# Patient Record
Sex: Male | Born: 1945 | Race: White | Hispanic: No | Marital: Married | Smoking: Former smoker
Health system: Southern US, Community
[De-identification: ages and names within clinical notes are randomized; demographics above are authoritative.]

## PROBLEM LIST (undated history)

## (undated) DIAGNOSIS — M1990 Unspecified osteoarthritis, unspecified site: Secondary | ICD-10-CM

## (undated) DIAGNOSIS — IMO0001 Reserved for inherently not codable concepts without codable children: Secondary | ICD-10-CM

## (undated) DIAGNOSIS — I4510 Unspecified right bundle-branch block: Secondary | ICD-10-CM

## (undated) DIAGNOSIS — R351 Nocturia: Secondary | ICD-10-CM

## (undated) DIAGNOSIS — I872 Venous insufficiency (chronic) (peripheral): Secondary | ICD-10-CM

## (undated) DIAGNOSIS — D494 Neoplasm of unspecified behavior of bladder: Secondary | ICD-10-CM

## (undated) DIAGNOSIS — Q642 Congenital posterior urethral valves: Secondary | ICD-10-CM

## (undated) DIAGNOSIS — D6851 Activated protein C resistance: Secondary | ICD-10-CM

## (undated) DIAGNOSIS — K219 Gastro-esophageal reflux disease without esophagitis: Secondary | ICD-10-CM

## (undated) DIAGNOSIS — R600 Localized edema: Secondary | ICD-10-CM

## (undated) DIAGNOSIS — M5136 Other intervertebral disc degeneration, lumbar region: Secondary | ICD-10-CM

## (undated) DIAGNOSIS — Z7901 Long term (current) use of anticoagulants: Secondary | ICD-10-CM

## (undated) DIAGNOSIS — Z8709 Personal history of other diseases of the respiratory system: Secondary | ICD-10-CM

## (undated) DIAGNOSIS — D682 Hereditary deficiency of other clotting factors: Secondary | ICD-10-CM

## (undated) DIAGNOSIS — Z8249 Family history of ischemic heart disease and other diseases of the circulatory system: Secondary | ICD-10-CM

## (undated) DIAGNOSIS — Z832 Family history of diseases of the blood and blood-forming organs and certain disorders involving the immune mechanism: Secondary | ICD-10-CM

## (undated) DIAGNOSIS — Z8679 Personal history of other diseases of the circulatory system: Secondary | ICD-10-CM

## (undated) DIAGNOSIS — R319 Hematuria, unspecified: Secondary | ICD-10-CM

## (undated) DIAGNOSIS — J45909 Unspecified asthma, uncomplicated: Secondary | ICD-10-CM

## (undated) DIAGNOSIS — M51369 Other intervertebral disc degeneration, lumbar region without mention of lumbar back pain or lower extremity pain: Secondary | ICD-10-CM

## (undated) DIAGNOSIS — I10 Essential (primary) hypertension: Secondary | ICD-10-CM

## (undated) DIAGNOSIS — I83811 Varicose veins of right lower extremities with pain: Secondary | ICD-10-CM

## (undated) DIAGNOSIS — Z973 Presence of spectacles and contact lenses: Secondary | ICD-10-CM

## (undated) DIAGNOSIS — N400 Enlarged prostate without lower urinary tract symptoms: Secondary | ICD-10-CM

## (undated) DIAGNOSIS — C801 Malignant (primary) neoplasm, unspecified: Secondary | ICD-10-CM

## (undated) DIAGNOSIS — J309 Allergic rhinitis, unspecified: Secondary | ICD-10-CM

## (undated) DIAGNOSIS — N401 Enlarged prostate with lower urinary tract symptoms: Secondary | ICD-10-CM

## (undated) DIAGNOSIS — Z8719 Personal history of other diseases of the digestive system: Secondary | ICD-10-CM

## (undated) DIAGNOSIS — M5030 Other cervical disc degeneration, unspecified cervical region: Secondary | ICD-10-CM

## (undated) DIAGNOSIS — I480 Paroxysmal atrial fibrillation: Secondary | ICD-10-CM

## (undated) DIAGNOSIS — R350 Frequency of micturition: Secondary | ICD-10-CM

## (undated) HISTORY — PX: CATARACT EXTRACTION W/ INTRAOCULAR LENS  IMPLANT, BILATERAL: SHX1307

## (undated) HISTORY — PX: OTHER SURGICAL HISTORY: SHX169

## (undated) HISTORY — PX: NO PAST SURGERIES: SHX2092

---

## 1983-01-14 DIAGNOSIS — M545 Low back pain, unspecified: Secondary | ICD-10-CM | POA: Insufficient documentation

## 1993-01-13 DIAGNOSIS — J45909 Unspecified asthma, uncomplicated: Secondary | ICD-10-CM | POA: Insufficient documentation

## 2003-01-14 DIAGNOSIS — M542 Cervicalgia: Secondary | ICD-10-CM | POA: Insufficient documentation

## 2003-05-25 ENCOUNTER — Ambulatory Visit (HOSPITAL_COMMUNITY): Admission: RE | Admit: 2003-05-25 | Discharge: 2003-05-25 | Payer: Self-pay | Admitting: Gastroenterology

## 2003-11-29 ENCOUNTER — Ambulatory Visit (HOSPITAL_COMMUNITY): Admission: RE | Admit: 2003-11-29 | Discharge: 2003-11-29 | Payer: Self-pay | Admitting: Gastroenterology

## 2005-03-14 ENCOUNTER — Encounter: Admission: RE | Admit: 2005-03-14 | Discharge: 2005-03-14 | Payer: Self-pay | Admitting: Internal Medicine

## 2005-08-22 ENCOUNTER — Encounter: Admission: RE | Admit: 2005-08-22 | Discharge: 2005-08-22 | Payer: Self-pay | Admitting: Internal Medicine

## 2006-06-19 ENCOUNTER — Encounter: Admission: RE | Admit: 2006-06-19 | Discharge: 2006-06-19 | Payer: Self-pay | Admitting: Internal Medicine

## 2008-04-17 ENCOUNTER — Encounter: Admission: RE | Admit: 2008-04-17 | Discharge: 2008-04-17 | Payer: Self-pay | Admitting: Neurological Surgery

## 2010-05-31 NOTE — Op Note (Signed)
NAME:  Christian [name], Christian [name]                           ACCOUNT NO.:  [id number]   MEDICAL RECORD NO.:  [id number]                   PATIENT TYPE:  AMB   LOCATION:  ENDO                                 FACILITY:  [facility]   PHYSICIAN:  Danise Edge, M.D.                DATE OF BIRTH:  28-Jun-1945   DATE OF PROCEDURE:  05/25/2003  DATE OF DISCHARGE:                                 OPERATIVE REPORT   PROCEDURE:  Surveillance colonoscopy.   PROCEDURE INDICATION:  Mr. Christian [name] is a 65 year old male born December 17, 1942.  Approximately 3 years ago, Mr. Christian [name] underwent a screening  colonoscopy in [facility]; colon polyps were removed.  Mr. Christian [name] was told  to have a repeat colonoscopy in 3 years.  Today he is due for a surveillance  colonoscopy with polypectomy to prevent colon cancer.   ENDOSCOPIST:  Danise Edge, M.D.   PREMEDICATION:  Versed 10 mg, Demerol 75 mg.   DESCRIPTION OF PROCEDURE:  After obtaining informed consent, Mr. Christian [name] was  placed in the left lateral decubitus position.  I administered intravenous  Demerol and intravenous Versed to achieve conscious sedation for the  procedure.  The patient's blood pressure, oxygen saturation, and cardiac  rhythm were monitored throughout the procedure and documented in the medical  record.   Anal inspection and digital rectal exam were normal.  The prostate was non  nodular.  The Olympus adjustable pediatric video colonoscope was introduced  into the rectum and advanced to the cecum.  Colonic preparation for the exam  today was excellent.   Rectum:  Normal.   Sigmoid colon and descending colon:  Normal.   Splenic flexure:  Normal.   Transverse colon:  Normal.   Hepatic flexure:  Normal.   Ascending colon:  Normal.   Cecum and ileocecal valve:  Normal.   ASSESSMENT:  Normal screening proctocolonoscopy to the cecum.   RECOMMENDATION:  Repeat colonoscopy in 5 years.                                               Danise Edge, M.D.    MJ/MEDQ  D:  05/25/2003  T:  05/25/2003  Job:  [id number]

## 2010-05-31 NOTE — Op Note (Signed)
NAMELARNIE, HEART                 ACCOUNT NO.:  [id number]   MEDICAL RECORD NO.:  [id number]          PATIENT TYPE:  AMB   LOCATION:  ENDO                         FACILITY:  [facility]   PHYSICIAN:  Danise Edge, M.D.   DATE OF BIRTH:  [birth year]   DATE OF PROCEDURE:  11/29/2003  DATE OF DISCHARGE:                                 OPERATIVE REPORT   PROCEDURE:  Esophagogastroduodenoscopy.   INDICATIONS FOR PROCEDURE:  Mr. Christian [name] is a 65 year old male born  [birth year].  When Christian [name] developed peptic ulcer type of symptoms, he  was evaluated by Dr. Johnella Moloney.  His H. pylori serology was negative.  His abdominal discomfort has markedly improved on Protonix.   ENDOSCOPIST:  Danise Edge, M.D.   PREMEDICATION:  Versed 7.5 mg, Demerol 50 mg.   PROCEDURE:  After obtaining informed consent, Christian [name] was placed in the  left lateral decubitus position.  I administered intravenous Demerol and  intravenous Versed to achieve conscious sedation for the procedure.  Patient's blood pressure, oxygen saturation, and cardiac rhythm were  monitored throughout the procedure and documented in the medical record.   The Olympus gastroscope was passed through the posterior hypopharynx into  the proximal esophagus without difficulty.  The hypopharynx, larynx, and  vocal cords appeared normal.   ESOPHAGOSCOPY:  The proximal, mid, and lower segments of the esophageal  mucosa appear normal.   GASTROSCOPY:  Retroflexed view of the gastric cardia and fundus was normal.  The gastric body, antrum, and pylorus appear normal.   DUODENOSCOPY:  The duodenal bulb, mid duodenum, and distal duodenum appear  normal.   ASSESSMENT:  Normal esophagogastroduodenoscopy.   RECOMMENDATIONS:  Christian [name] will remain off nonsteroidal anti-inflammatory  medication.  He will stop his Protonix.      MJ/MEDQ  D:  11/29/2003  T:  11/29/2003  Job:  [id number]   cc:   Candyce Churn, M.D.  [address]  [place]  [place] [address]  Fax: [phone]

## 2010-12-02 ENCOUNTER — Ambulatory Visit
Admission: RE | Admit: 2010-12-02 | Discharge: 2010-12-02 | Disposition: A | Discharge status: Home / Self Care | Payer: BC Managed Care – PPO | Source: Ambulatory Visit | Attending: Neurological Surgery | Admitting: Neurological Surgery

## 2010-12-02 ENCOUNTER — Other Ambulatory Visit: Payer: Self-pay | Admitting: Neurological Surgery

## 2010-12-02 DIAGNOSIS — M545 Low back pain, unspecified: Secondary | ICD-10-CM

## 2011-02-21 ENCOUNTER — Other Ambulatory Visit: Payer: Self-pay | Admitting: Urology

## 2011-02-26 ENCOUNTER — Encounter (HOSPITAL_BASED_OUTPATIENT_CLINIC_OR_DEPARTMENT_OTHER): Payer: Self-pay | Admitting: *Deleted

## 2011-02-28 ENCOUNTER — Encounter (HOSPITAL_BASED_OUTPATIENT_CLINIC_OR_DEPARTMENT_OTHER): Payer: Self-pay | Admitting: *Deleted

## 2011-02-28 NOTE — Progress Notes (Signed)
NPO AFTER MN. ARRIVES AT 0745. NEEDS ISTAT AND EKG. WILL TAKE PRILOSEC AM OF SURG. W/ SIP OF WATER. REVIEWED [facility] GUIDELINES , WILL BRING MED.

## 2011-03-07 NOTE — H&P (Signed)
Christian [name] is an 66 y.o. male.   Chief Complaint:For Gyrus TURP secondary to BPH. HPI:Christian [name] presents now status post his urodynamics. He has had very longstanding obstructive and irritative voiding symptoms. He remains on tamsulosin and Proscar but has not been satisfied with the situation. At the time of urodynamics we again confirmed incomplete bladder emptying. He had initial postvoid residual of about 150 cc. The patient had good capacity bladder. There was some very minimal instability but not until over 500 cc had been instilled. Maximum flow was around 10 mL per second with a detrusor pressure of about 56 cm of water pressure. His void was quite prolonged and intermittent. Urodynamic picture is certainly consistent with outlet obstruction from benign prostatic hyperplasia with incomplete bladder emptying. His clinical situation is otherwise unchanged.   Past Medical History  Diagnosis Date  . Congenital posterior urethral valve   . Factor V deficiency   . BPH (benign prostatic hypertrophy)   . Frequency of urination   . Nocturia   . H/O bundle branch block   . Arthritis   . Hypertension   . History of gastroesophageal reflux (GERD)   . DDD (degenerative disc disease), cervical   . DDD (degenerative disc disease), lumbar     Past Surgical History  Procedure Date  . Cataract extraction w/ intraocular lens  implant, bilateral 3 yrs ago  . Repair congenital urethral defect INFANT    History reviewed. No pertinent family history. Social History:  reports that he quit smoking about 16 years ago. His smoking use included Cigarettes. He has a 20 pack-year smoking history. He has never used smokeless tobacco. He reports that he drinks about 6 ounces of alcohol per week. He reports that he does not use illicit drugs.  Allergies:  Allergies  Allergen Reactions  . Adhesive (Tape) Other (See Comments)    Redness/ irritation    No current facility-administered medications on file  as of .   Medications Prior to Admission  Medication Sig Dispense Refill  . acetaminophen (TYLENOL) 500 MG tablet Take 500 mg by mouth every 6 (six) hours as needed.      Marland Kitchen aspirin EC 81 MG tablet Take 81 mg by mouth daily.      . Cholecalciferol (D-3-5) 5000 UNITS capsule Take 5,000 Units by mouth daily.      . Coenzyme Q10 (COQ10) 200 MG CAPS Take 1 capsule by mouth daily.      . diclofenac (VOLTAREN) 75 MG EC tablet Take 75 mg by mouth daily. AND PRN      . finasteride (PROSCAR) 5 MG tablet Take 5 mg by mouth daily.      Marland Kitchen HYDROcodone-acetaminophen (NORCO) 5-325 MG per tablet Take 1 tablet by mouth every 6 (six) hours as needed.      Marland Kitchen ibuprofen (ADVIL,MOTRIN) 800 MG tablet Take 800 mg by mouth every 8 (eight) hours as needed.      . Multiple Vitamins-Minerals (CENTRUM SILVER PO) Take 1 tablet by mouth daily.      Marland Kitchen omeprazole (PRILOSEC) 20 MG capsule Take 20 mg by mouth daily. TAKES PRILOSEC WITH DICLOFENAC      . Psyllium (METAMUCIL SMOOTH TEXTURE PO) Take by mouth daily.      . sodium bicarbonate/sodium chloride SOLN by Mouth Rinse route daily. THERASOL MOUTH RINSE DAILY AND PRN      . Tamsulosin HCl (FLOMAX) 0.4 MG CAPS Take 0.4 mg by mouth daily after breakfast.      . triamterene-hydrochlorothiazide (  MAXZIDE) 75-50 MG per tablet Take 1 tablet by mouth daily.      Marland Kitchen zolpidem (AMBIEN) 5 MG tablet Take 5 mg by mouth at bedtime as needed.        No results found for this or any previous visit (from the past 48 hour([name])). No results found.  Review of Systems - Negative except Significant Obstructive and irritative voiding symptoms.  Height 5\' 11"  (1.803 m), weight 92.987 kg (205 lb). General appearance: alert, cooperative and no distress Neck: no adenopathy and no JVD Resp: clear to auscultation bilaterally Cardio: regular rate and rhythm GI: soft, non-tender; bowel sounds normal; no masses,  no organomegaly Male genitalia: normal, penis: no lesions or discharge. testes: no masses  or tenderness. no hernias Pelvic: external genitalia normal Extremities: extremities normal, atraumatic, no cyanosis or edema Skin: Skin color, texture, turgor normal. No rashes or lesions Neurologic: Grossly normal  Assessment/Plan Christian [name] has had long-standing progressive outlet obstructive symptoms. Urodynamic testing confirmed outlet obstruction. He has failed maximal medical therapy. He is interested in a more definitive treatment plan. He presents than the advantages disadvantages and potential risk of TURP surgery. He is to undergo that procedure with admission for observation status post the procedure. Informed consent has been obtained.  Christian [name] 03/07/2011, 2:10 PM

## 2011-03-10 ENCOUNTER — Encounter (HOSPITAL_BASED_OUTPATIENT_CLINIC_OR_DEPARTMENT_OTHER): Payer: Self-pay | Admitting: Anesthesiology

## 2011-03-10 ENCOUNTER — Other Ambulatory Visit: Payer: Self-pay | Admitting: Urology

## 2011-03-10 ENCOUNTER — Ambulatory Visit (HOSPITAL_BASED_OUTPATIENT_CLINIC_OR_DEPARTMENT_OTHER): Payer: BC Managed Care – PPO | Admitting: Anesthesiology

## 2011-03-10 ENCOUNTER — Encounter (HOSPITAL_BASED_OUTPATIENT_CLINIC_OR_DEPARTMENT_OTHER)
Admission: RE | Disposition: A | Discharge status: Home / Self Care | Payer: Self-pay | Source: Ambulatory Visit | Attending: Urology

## 2011-03-10 ENCOUNTER — Other Ambulatory Visit: Payer: Self-pay

## 2011-03-10 ENCOUNTER — Encounter (HOSPITAL_BASED_OUTPATIENT_CLINIC_OR_DEPARTMENT_OTHER): Payer: Self-pay | Admitting: *Deleted

## 2011-03-10 ENCOUNTER — Ambulatory Visit (HOSPITAL_BASED_OUTPATIENT_CLINIC_OR_DEPARTMENT_OTHER)
Admission: RE | Admit: 2011-03-10 | Discharge: 2011-03-10 | Disposition: A | Discharge status: Home / Self Care | Payer: BC Managed Care – PPO | Source: Ambulatory Visit | Attending: Urology | Admitting: Urology

## 2011-03-10 DIAGNOSIS — Z01812 Encounter for preprocedural laboratory examination: Secondary | ICD-10-CM | POA: Insufficient documentation

## 2011-03-10 DIAGNOSIS — N400 Enlarged prostate without lower urinary tract symptoms: Secondary | ICD-10-CM

## 2011-03-10 DIAGNOSIS — K219 Gastro-esophageal reflux disease without esophagitis: Secondary | ICD-10-CM | POA: Insufficient documentation

## 2011-03-10 DIAGNOSIS — Z79899 Other long term (current) drug therapy: Secondary | ICD-10-CM | POA: Insufficient documentation

## 2011-03-10 DIAGNOSIS — I10 Essential (primary) hypertension: Secondary | ICD-10-CM | POA: Insufficient documentation

## 2011-03-10 DIAGNOSIS — Z01810 Encounter for preprocedural cardiovascular examination: Secondary | ICD-10-CM | POA: Insufficient documentation

## 2011-03-10 DIAGNOSIS — N401 Enlarged prostate with lower urinary tract symptoms: Secondary | ICD-10-CM | POA: Insufficient documentation

## 2011-03-10 DIAGNOSIS — Z7982 Long term (current) use of aspirin: Secondary | ICD-10-CM | POA: Insufficient documentation

## 2011-03-10 DIAGNOSIS — N138 Other obstructive and reflux uropathy: Secondary | ICD-10-CM | POA: Insufficient documentation

## 2011-03-10 DIAGNOSIS — D6859 Other primary thrombophilia: Secondary | ICD-10-CM | POA: Insufficient documentation

## 2011-03-10 DIAGNOSIS — N320 Bladder-neck obstruction: Secondary | ICD-10-CM | POA: Insufficient documentation

## 2011-03-10 HISTORY — DX: Unspecified osteoarthritis, unspecified site: M19.90

## 2011-03-10 HISTORY — DX: Essential (primary) hypertension: I10

## 2011-03-10 HISTORY — DX: Personal history of other diseases of the circulatory system: Z86.79

## 2011-03-10 HISTORY — DX: Congenital posterior urethral valves: Q64.2

## 2011-03-10 HISTORY — DX: Benign prostatic hyperplasia without lower urinary tract symptoms: N40.0

## 2011-03-10 HISTORY — DX: Personal history of other diseases of the digestive system: Z87.19

## 2011-03-10 HISTORY — DX: Other intervertebral disc degeneration, lumbar region: M51.36

## 2011-03-10 HISTORY — DX: Frequency of micturition: R35.0

## 2011-03-10 HISTORY — PX: TRANSURETHRAL RESECTION OF PROSTATE: SHX73

## 2011-03-10 HISTORY — DX: Other intervertebral disc degeneration, lumbar region without mention of lumbar back pain or lower extremity pain: M51.369

## 2011-03-10 HISTORY — DX: Hereditary deficiency of other clotting factors: D68.2

## 2011-03-10 HISTORY — DX: Other cervical disc degeneration, unspecified cervical region: M50.30

## 2011-03-10 HISTORY — DX: Nocturia: R35.1

## 2011-03-10 LAB — POCT I-STAT 4, (NA,K, GLUC, HGB,HCT)
Glucose, Bld: 102 mg/dL — ABNORMAL HIGH (ref 70–99)
HCT: 48 % (ref 39.0–52.0)

## 2011-03-10 SURGERY — TRANSURETHRAL RESECTION OF THE PROSTATE WITH GYRUS INSTRUMENTS
Anesthesia: General | Site: Prostate | Wound class: Clean Contaminated

## 2011-03-10 MED ORDER — PROPOFOL 10 MG/ML IV EMUL
INTRAVENOUS | Status: DC | PRN
  Administered 2011-03-10: 50 mg via INTRAVENOUS
  Administered 2011-03-10: 200 mg via INTRAVENOUS

## 2011-03-10 MED ORDER — CIPROFLOXACIN IN D5W 400 MG/200ML IV SOLN
400.0000 mg | INTRAVENOUS | Status: AC
  Administered 2011-03-10: 400 mg via INTRAVENOUS

## 2011-03-10 MED ORDER — FENTANYL CITRATE 0.05 MG/ML IJ SOLN
INTRAMUSCULAR | Status: DC | PRN
  Administered 2011-03-10 (×4): 25 ug via INTRAVENOUS

## 2011-03-10 MED ORDER — SODIUM CHLORIDE 0.9 % IR SOLN
Status: DC | PRN
  Administered 2011-03-10: 6000 mL

## 2011-03-10 MED ORDER — FENTANYL CITRATE 0.05 MG/ML IJ SOLN
25.0000 ug | INTRAMUSCULAR | Status: DC | PRN
  Administered 2011-03-10: 25 ug via INTRAVENOUS

## 2011-03-10 MED ORDER — MIDAZOLAM HCL 5 MG/5ML IJ SOLN
INTRAMUSCULAR | Status: DC | PRN
  Administered 2011-03-10: 2 mg via INTRAVENOUS

## 2011-03-10 MED ORDER — DEXAMETHASONE SODIUM PHOSPHATE 4 MG/ML IJ SOLN
INTRAMUSCULAR | Status: DC | PRN
  Administered 2011-03-10: 10 mg via INTRAVENOUS

## 2011-03-10 MED ORDER — CIPROFLOXACIN HCL 250 MG PO TABS: 250.0000 mg | ORAL_TABLET | Freq: Two times a day (BID) | ORAL | Status: AC

## 2011-03-10 MED ORDER — LIDOCAINE HCL (CARDIAC) 20 MG/ML IV SOLN
INTRAVENOUS | Status: DC | PRN
  Administered 2011-03-10: 100 mg via INTRAVENOUS

## 2011-03-10 MED ORDER — CIPROFLOXACIN HCL 250 MG PO TABS: 250.0000 mg | ORAL_TABLET | Freq: Two times a day (BID) | ORAL | Status: DC

## 2011-03-10 MED ORDER — PROMETHAZINE HCL 25 MG/ML IJ SOLN: 6.2500 mg | INTRAMUSCULAR | Status: DC | PRN

## 2011-03-10 MED ORDER — LACTATED RINGERS IV SOLN
INTRAVENOUS | Status: DC
  Administered 2011-03-10 (×2): via INTRAVENOUS

## 2011-03-10 MED ORDER — HYDROCODONE-ACETAMINOPHEN 5-325 MG PO TABS
1.0000 | ORAL_TABLET | Freq: Four times a day (QID) | ORAL | Status: DC | PRN
  Administered 2011-03-10: 1 via ORAL

## 2011-03-10 MED ORDER — ONDANSETRON HCL 4 MG/2ML IJ SOLN
INTRAMUSCULAR | Status: DC | PRN
  Administered 2011-03-10: 4 mg via INTRAVENOUS

## 2011-03-10 MED ORDER — BELLADONNA ALKALOIDS-OPIUM 16.2-60 MG RE SUPP
RECTAL | Status: DC | PRN
  Administered 2011-03-10: 1 via RECTAL

## 2011-03-10 MED ORDER — LIDOCAINE HCL 2 % EX GEL
CUTANEOUS | Status: DC | PRN
  Administered 2011-03-10: 1

## 2011-03-10 MED ORDER — HYDROCODONE-ACETAMINOPHEN 5-325 MG PO TABS: 1.0000 | ORAL_TABLET | Freq: Four times a day (QID) | ORAL | Status: AC | PRN

## 2011-03-10 SURGICAL SUPPLY — 26 items
BAG DRAIN URO-CYSTO SKYTR STRL (DRAIN) ×2 IMPLANT
BAG URINE DRAINAGE (UROLOGICAL SUPPLIES) ×2 IMPLANT
BAG URINE LEG 19OZ MD ST LTX (BAG) IMPLANT
CANISTER SUCT LVC 12 LTR MEDI- (MISCELLANEOUS) ×4 IMPLANT
CATH FOLEY 2WAY SLVR  5CC 20FR (CATHETERS)
CATH FOLEY 2WAY SLVR  5CC 22FR (CATHETERS)
CATH FOLEY 2WAY SLVR 30CC 22FR (CATHETERS) ×2 IMPLANT
CATH FOLEY 2WAY SLVR 5CC 20FR (CATHETERS) IMPLANT
CATH FOLEY 2WAY SLVR 5CC 22FR (CATHETERS) IMPLANT
CATH FOLEY 3WAY 30CC 22F (CATHETERS) IMPLANT
CLOTH BEACON ORANGE TIMEOUT ST (SAFETY) ×2 IMPLANT
DRAPE CAMERA CLOSED 9X96 (DRAPES) ×2 IMPLANT
ELECT BUTTON HF 24-28F 2 30DE (ELECTRODE) IMPLANT
ELECT LOOP MED HF 24F 12D CBL (CLIP) ×2 IMPLANT
ELECT REM PT RETURN 9FT ADLT (ELECTROSURGICAL) ×2
ELECT RESECT VAPORIZE 12D CBL (ELECTRODE) ×2 IMPLANT
ELECTRODE REM PT RTRN 9FT ADLT (ELECTROSURGICAL) ×1 IMPLANT
EVACUATOR MICROVAS BLADDER (UROLOGICAL SUPPLIES) IMPLANT
GLOVE BIO SURGEON STRL SZ7.5 (GLOVE) ×2 IMPLANT
GOWN STRL REIN XL XLG (GOWN DISPOSABLE) ×2 IMPLANT
HOLDER FOLEY CATH W/STRAP (MISCELLANEOUS) ×2 IMPLANT
KIT ASPIRATION TUBING (SET/KITS/TRAYS/PACK) ×2 IMPLANT
PACK CYSTOSCOPY (CUSTOM PROCEDURE TRAY) ×2 IMPLANT
PLUG CATH AND CAP STER (CATHETERS) IMPLANT
SET ASPIRATION TUBING (TUBING) IMPLANT
SYRINGE IRR TOOMEY STRL 70CC (SYRINGE) ×2 IMPLANT

## 2011-03-10 NOTE — Progress Notes (Signed)
Dr Isabel Caprice in to see pt & re-evaluate if pt will be a D/C to home vs original plans to admit to [facility]. No new orders @ this time.

## 2011-03-10 NOTE — Anesthesia Procedure Notes (Signed)
Procedure Name: LMA Insertion Date/Time: 03/10/2011 9:10 AM Performed by: Huel Coventry Pre-anesthesia Checklist: Patient identified, Emergency Drugs available, Suction available and Patient being monitored Patient Re-evaluated:Patient Re-evaluated prior to inductionOxygen Delivery Method: Circle System Utilized Preoxygenation: Pre-oxygenation with 100% oxygen Intubation Type: IV induction Ventilation: Mask ventilation without difficulty LMA: LMA inserted LMA Size: 5.0 Number of attempts: 1 Airway Equipment and Method: bite block Placement Confirmation: positive ETCO2 Tube secured with: Tape Dental Injury: Teeth and Oropharynx as per pre-operative assessment

## 2011-03-10 NOTE — Anesthesia Postprocedure Evaluation (Signed)
  Anesthesia Post-op Note  Patient: Christian [name]  Procedure(s) Performed: Procedure(s) (LRB): TRANSURETHRAL RESECTION OF THE PROSTATE WITH GYRUS INSTRUMENTS (N/A)  Patient Location: PACU  Anesthesia Type: General  Level of Consciousness: awake and alert   Airway and Oxygen Therapy: Patient Spontanous Breathing  Post-op Pain: mild  Post-op Assessment: Post-op Vital signs reviewed, Patient's Cardiovascular Status Stable, Respiratory Function Stable, Patent Airway and No signs of Nausea or vomiting  Post-op Vital Signs: stable  Complications: No apparent anesthesia complications

## 2011-03-10 NOTE — Op Note (Signed)
Preoperative diagnosis: bladder neck obstruction secondary to BPH Postoperative diagnosis: Same  Procedure: Gyrus saline TURP  Surgeon: Valetta Fuller M.D.  Anesthesia: Gen.  Indications: Christian [name] has had long-standing and progressive outlet obstructive symptoms. He has been on tamsulosin and Proscar. Urodynamics demonstrated incomplete bladder emptying. Pressure flow studies were consistent with outlet obstruction. We discussed options at length. He remained this is satisfied with his voiding status. We discussed the various options for managing BPH. He elected to proceed with TURP. Risks and benefits as well as complications and issues discussed at length. Patient has received perioperative antibiotics. He also had placement of PAS compression boots.     Technique and findings: the patient was brought to the operating room where he had successful induction of general anesthesia. He was placed in lithotomy position and prepped and draped in usual manner. Appropriate surgical timeout was performed. The patient had been noted in the past to have distal hypospadias with mild meatal stenosis. He underwent dilation to [address] [place] with [employer] sounds. A 26 French continuous flow resectoscope was then inserted without difficulty. The patient had a relatively short prostatic urethra measuring about 3-1/2 cm. He had minimal lateral lobe tissue but a very high riding and prominent median bar. The bladder was otherwise endoscopically unremarkable. Initially a loop was used with saline as an irrigant in the bladder neck was taken down to capsular fibers. We then switched to a button electrode and use that for both hemostasis at the bladder neck as well as to illuminate some the lateral lobe tissue. Apical tissue was all left intact. At the completion of the procedure the bladder neck was wide open visually. Hemostasis was excellent. A small amount prostate chips that had been resected were removed and will be sent for  pathologic analysis. A 22 French Foley catheter was inserted with drainage of light pink urine. No obvious complications or problems occurred and the patient was brought to recovery room in stable condition.

## 2011-03-10 NOTE — Progress Notes (Signed)
Dr Isabel Caprice in to see pt & has not decided if pt will be an admit.

## 2011-03-10 NOTE — Discharge Instructions (Addendum)

## 2011-03-10 NOTE — Transfer of Care (Signed)
Immediate Anesthesia Transfer of Care Note  Patient: Christian [name]  Procedure(s) Performed: Procedure(s) (LRB): TRANSURETHRAL RESECTION OF THE PROSTATE WITH GYRUS INSTRUMENTS (N/A)  Patient Location: PACU  Anesthesia Type: General  Level of Consciousness: awake, alert  and oriented  Airway & Oxygen Therapy: Patient Spontanous Breathing and Patient connected to face mask oxygen  Post-op Assessment: Report given to PACU RN and Post -op Vital signs reviewed and stable  Post vital signs: Reviewed and stable  Complications: No apparent anesthesia complications

## 2011-03-10 NOTE — Anesthesia Preprocedure Evaluation (Addendum)
Anesthesia Evaluation  Patient identified by MRN, date of birth, ID band Patient awake    Reviewed: Allergy & Precautions, H&P , NPO status , Patient's Chart, lab work & pertinent test results  Airway Mallampati: II TM Distance: >3 FB Neck ROM: Full    Dental No notable dental hx.    Pulmonary former smoker clear to auscultation  Pulmonary exam normal       Cardiovascular hypertension, Pt. on medications Regular Normal H/o bundle branch block    Neuro/Psych Negative Neurological ROS  Negative Psych ROS   GI/Hepatic Neg liver ROS, GERD-  Medicated,  Endo/Other  Negative Endocrine ROS  Renal/GU negative Renal ROS  Genitourinary negative   Musculoskeletal negative musculoskeletal ROS (+)   Abdominal   Peds negative pediatric ROS (+)  Hematology negative hematology ROS (+)   Anesthesia Other Findings   Reproductive/Obstetrics negative OB ROS                          Anesthesia Physical Anesthesia Plan  ASA: II  Anesthesia Plan: General   Post-op Pain Management:    Induction: Intravenous  Airway Management Planned: LMA  Additional Equipment:   Intra-op Plan:   Post-operative Plan: Extubation in OR  Informed Consent: I have reviewed the patients History and Physical, chart, labs and discussed the procedure including the risks, benefits and alternatives for the proposed anesthesia with the patient or authorized representative who has indicated his/her understanding and acceptance.   Dental advisory given  Plan Discussed with: CRNA  Anesthesia Plan Comments:         Anesthesia Quick Evaluation

## 2011-03-10 NOTE — Interval H&P Note (Signed)
History and Physical Interval Note:  03/10/2011 9:00 AM  Christian [name]  has presented today for surgery, with the diagnosis of Benign Prostatic Hypertrophy  The various methods of treatment have been discussed with the patient and family. After consideration of risks, benefits and other options for treatment, the patient has consented to  Procedure(s) (LRB): TRANSURETHRAL RESECTION OF THE PROSTATE WITH GYRUS INSTRUMENTS (N/A) as a surgical intervention .  The patients' history has been reviewed, patient examined, no change in status, stable for surgery.  I have reviewed the patients' chart and labs.  Questions were answered to the patient's satisfaction.     Leonarda Leis S

## 2011-03-11 ENCOUNTER — Encounter (HOSPITAL_BASED_OUTPATIENT_CLINIC_OR_DEPARTMENT_OTHER): Payer: Self-pay | Admitting: Urology

## 2012-01-14 HISTORY — PX: CATARACT EXTRACTION W/ INTRAOCULAR LENS  IMPLANT, BILATERAL: SHX1307

## 2014-01-04 ENCOUNTER — Other Ambulatory Visit: Payer: Self-pay | Admitting: Gastroenterology

## 2014-06-22 ENCOUNTER — Other Ambulatory Visit: Payer: Self-pay | Admitting: Gastroenterology

## 2014-06-27 ENCOUNTER — Encounter (HOSPITAL_COMMUNITY)
Admission: RE | Disposition: A | Discharge status: Home / Self Care | Payer: Self-pay | Source: Ambulatory Visit | Attending: Gastroenterology

## 2014-06-27 ENCOUNTER — Encounter (HOSPITAL_COMMUNITY): Payer: Self-pay

## 2014-06-27 ENCOUNTER — Ambulatory Visit (HOSPITAL_COMMUNITY)
Admission: RE | Admit: 2014-06-27 | Discharge: 2014-06-27 | Disposition: A | Discharge status: Home / Self Care | Payer: PPO | Source: Ambulatory Visit | Attending: Gastroenterology | Admitting: Gastroenterology

## 2014-06-27 DIAGNOSIS — Z9079 Acquired absence of other genital organ(s): Secondary | ICD-10-CM | POA: Insufficient documentation

## 2014-06-27 DIAGNOSIS — Z86010 Personal history of colonic polyps: Secondary | ICD-10-CM | POA: Diagnosis not present

## 2014-06-27 DIAGNOSIS — J45909 Unspecified asthma, uncomplicated: Secondary | ICD-10-CM | POA: Diagnosis not present

## 2014-06-27 DIAGNOSIS — I10 Essential (primary) hypertension: Secondary | ICD-10-CM | POA: Diagnosis not present

## 2014-06-27 DIAGNOSIS — K621 Rectal polyp: Secondary | ICD-10-CM | POA: Diagnosis not present

## 2014-06-27 DIAGNOSIS — Z09 Encounter for follow-up examination after completed treatment for conditions other than malignant neoplasm: Secondary | ICD-10-CM | POA: Diagnosis present

## 2014-06-27 HISTORY — PX: COLONOSCOPY: SHX5424

## 2014-06-27 SURGERY — COLONOSCOPY
Anesthesia: Moderate Sedation

## 2014-06-27 MED ORDER — FENTANYL CITRATE (PF) 100 MCG/2ML IJ SOLN
INTRAMUSCULAR | Status: AC
  Filled 2014-06-27: qty 2

## 2014-06-27 MED ORDER — SODIUM CHLORIDE 0.9 % IV SOLN: INTRAVENOUS | Status: DC

## 2014-06-27 MED ORDER — MIDAZOLAM HCL 5 MG/5ML IJ SOLN
INTRAMUSCULAR | Status: DC | PRN
  Administered 2014-06-27 (×3): 2.5 mg via INTRAVENOUS

## 2014-06-27 MED ORDER — MIDAZOLAM HCL 5 MG/ML IJ SOLN
INTRAMUSCULAR | Status: AC
  Filled 2014-06-27: qty 2

## 2014-06-27 MED ORDER — DIPHENHYDRAMINE HCL 50 MG/ML IJ SOLN
INTRAMUSCULAR | Status: AC
  Filled 2014-06-27: qty 1

## 2014-06-27 MED ORDER — FENTANYL CITRATE (PF) 100 MCG/2ML IJ SOLN
INTRAMUSCULAR | Status: DC | PRN
  Administered 2014-06-27: 25 ug via INTRAVENOUS
  Administered 2014-06-27: 50 ug via INTRAVENOUS

## 2014-06-27 MED ORDER — LACTATED RINGERS IV SOLN
INTRAVENOUS | Status: DC
  Administered 2014-06-27: 1000 mL via INTRAVENOUS

## 2014-06-27 NOTE — Discharge Instructions (Signed)
Colonoscopy, Care After °These instructions give you information on caring for yourself after your procedure. Your doctor may also give you more specific instructions. Call your doctor if you have any problems or questions after your procedure. °HOME CARE °· Do not drive for 24 hours. °· Do not sign important papers or use machinery for 24 hours. °· You may shower. °· You may go back to your usual activities, but go slower for the first 24 hours. °· Take rest breaks often during the first 24 hours. °· Walk around or use warm packs on your belly (abdomen) if you have belly cramping or gas. °· Drink enough fluids to keep your pee (urine) clear or pale yellow. °· Resume your normal diet. Avoid heavy or fried foods. °· Avoid drinking alcohol for 24 hours or as told by your doctor. °· Only take medicines as told by your doctor. °If a tissue sample (biopsy) was taken during the procedure:  °· Do not take aspirin or blood thinners for 7 days, or as told by your doctor. °· Do not drink alcohol for 7 days, or as told by your doctor. °· Eat soft foods for the first 24 hours. °GET HELP IF: °You still have a small amount of blood in your poop (stool) 2-3 days after the procedure. °GET HELP RIGHT AWAY IF: °· You have more than a small amount of blood in your poop. °· You see clumps of tissue (blood clots) in your poop. °· Your belly is puffy (swollen). °· You feel sick to your stomach (nauseous) or throw up (vomit). °· You have a fever. °· You have belly pain that gets worse and medicine does not help. °MAKE SURE YOU: °· Understand these instructions. °· Will watch your condition. °· Will get help right away if you are not doing well or get worse. °Document Released: 02/01/2010 Document Revised: 01/04/2013 Document Reviewed: 09/06/2012 °[employer]® Patient Information ©2015 [employer]. This information is not intended to replace advice given to you by your health care provider. Make sure you discuss any questions you have with  your health care provider. ° °

## 2014-06-27 NOTE — H&P (Signed)
  Procedure: Screening colonoscopy. Normal surveillance colonoscopies performed on 05/25/2003 and 05/25/2008. History of colon polyps removed in [place], [place] in 2002  History: The patient is a 69 year old male born 08-19-1945. He is scheduled to undergo a repeat surveillance colonoscopy today.  Past medical history: Asthma. Benign prosthetic hypertrophy. Right bundle branch block pattern by electrocardiogram. Hypertension. Transurethral resection of prostate performed in March 2013. Bilateral cataract surgeries.  Medication allergies: None  Exam: The patient is alert and lying comfortably on the endoscopy stretcher. Abdomen is soft and nontender to palpation. Lungs are clear to auscultation. Cardiac exam reveals a regular rhythm.  Plan: Proceed with surveillance colonoscopy

## 2014-06-27 NOTE — Op Note (Signed)
Procedure: Surveillance colonoscopy. Normal surveillance colonoscopies performed in 05/25/2003 and 05/25/2008. History of colon polyps removed in [place], [place] in 2002  Endoscopist: Earle Gell  Premedication: Versed 7.5 mg. Fentanyl 75 g.  Procedure: The patient was placed in the left lateral decubitus position. Anal inspection and digital rectal exam were normal. The Pentax pediatric colonoscope was introduced into the rectum and advanced to the cecum. A normal-appearing appendiceal orifice and ileocecal valve were identified. Colonic preparation for the exam today was good. Withdrawal time was 13 minutes  Rectum. From the mid rectum,two 3 mm sized sessile polyps were removed with the cold biopsy forceps. Retroflexed view of the distal rectum normal  Sigmoid colon and descending colon. Normal  Splenic flexure. Normal.  Transverse colon. Normal  Hepatic flexure. Normal  Ascending colon. Normal  Cecum and ileocecal valve. Normal  Assessment: Two diminutive mid rectal polyps were removed with the cold biopsy forceps. Otherwise normal colonoscopy.  Recommendation: If rectal polyps return adenomatous pathologically, schedule repeat colonoscopy in 5 years. If the polyps return hyperplastic pathologically, schedule repeat colonoscopy in 10 years

## 2014-06-28 ENCOUNTER — Encounter (HOSPITAL_COMMUNITY): Payer: Self-pay | Admitting: Gastroenterology

## 2015-02-20 DIAGNOSIS — J069 Acute upper respiratory infection, unspecified: Secondary | ICD-10-CM | POA: Diagnosis not present

## 2015-03-23 DIAGNOSIS — R05 Cough: Secondary | ICD-10-CM | POA: Diagnosis not present

## 2015-03-29 DIAGNOSIS — J329 Chronic sinusitis, unspecified: Secondary | ICD-10-CM | POA: Diagnosis not present

## 2015-03-29 DIAGNOSIS — J42 Unspecified chronic bronchitis: Secondary | ICD-10-CM | POA: Diagnosis not present

## 2015-04-12 ENCOUNTER — Other Ambulatory Visit: Payer: Self-pay | Admitting: Internal Medicine

## 2015-04-12 ENCOUNTER — Ambulatory Visit
Admission: RE | Admit: 2015-04-12 | Discharge: 2015-04-12 | Disposition: A | Discharge status: Home / Self Care | Payer: PPO | Source: Ambulatory Visit | Attending: Internal Medicine | Admitting: Internal Medicine

## 2015-04-12 DIAGNOSIS — J42 Unspecified chronic bronchitis: Secondary | ICD-10-CM

## 2015-04-12 DIAGNOSIS — J329 Chronic sinusitis, unspecified: Secondary | ICD-10-CM | POA: Diagnosis not present

## 2015-04-12 DIAGNOSIS — J309 Allergic rhinitis, unspecified: Secondary | ICD-10-CM | POA: Diagnosis not present

## 2015-04-12 DIAGNOSIS — R05 Cough: Secondary | ICD-10-CM | POA: Diagnosis not present

## 2015-04-12 DIAGNOSIS — R0602 Shortness of breath: Secondary | ICD-10-CM | POA: Diagnosis not present

## 2015-04-20 DIAGNOSIS — J329 Chronic sinusitis, unspecified: Secondary | ICD-10-CM | POA: Diagnosis not present

## 2015-04-20 DIAGNOSIS — J309 Allergic rhinitis, unspecified: Secondary | ICD-10-CM | POA: Diagnosis not present

## 2015-04-20 DIAGNOSIS — J42 Unspecified chronic bronchitis: Secondary | ICD-10-CM | POA: Diagnosis not present

## 2015-05-18 DIAGNOSIS — Z79899 Other long term (current) drug therapy: Secondary | ICD-10-CM | POA: Diagnosis not present

## 2015-05-18 DIAGNOSIS — E7800 Pure hypercholesterolemia, unspecified: Secondary | ICD-10-CM | POA: Diagnosis not present

## 2015-05-18 DIAGNOSIS — R7303 Prediabetes: Secondary | ICD-10-CM | POA: Diagnosis not present

## 2015-05-24 DIAGNOSIS — E785 Hyperlipidemia, unspecified: Secondary | ICD-10-CM | POA: Diagnosis not present

## 2015-05-24 DIAGNOSIS — J309 Allergic rhinitis, unspecified: Secondary | ICD-10-CM | POA: Diagnosis not present

## 2015-05-24 DIAGNOSIS — R739 Hyperglycemia, unspecified: Secondary | ICD-10-CM | POA: Diagnosis not present

## 2015-05-24 DIAGNOSIS — I10 Essential (primary) hypertension: Secondary | ICD-10-CM | POA: Diagnosis not present

## 2015-05-24 DIAGNOSIS — J42 Unspecified chronic bronchitis: Secondary | ICD-10-CM | POA: Diagnosis not present

## 2015-06-04 DIAGNOSIS — M720 Palmar fascial fibromatosis [Dupuytren]: Secondary | ICD-10-CM | POA: Diagnosis not present

## 2015-06-05 DIAGNOSIS — M5412 Radiculopathy, cervical region: Secondary | ICD-10-CM | POA: Diagnosis not present

## 2015-06-08 DIAGNOSIS — M50222 Other cervical disc displacement at C5-C6 level: Secondary | ICD-10-CM | POA: Diagnosis not present

## 2015-06-08 DIAGNOSIS — M5412 Radiculopathy, cervical region: Secondary | ICD-10-CM | POA: Diagnosis not present

## 2015-06-08 DIAGNOSIS — M50221 Other cervical disc displacement at C4-C5 level: Secondary | ICD-10-CM | POA: Diagnosis not present

## 2015-06-13 ENCOUNTER — Other Ambulatory Visit: Payer: Self-pay | Admitting: Neurological Surgery

## 2015-06-13 DIAGNOSIS — M5412 Radiculopathy, cervical region: Secondary | ICD-10-CM

## 2015-06-26 ENCOUNTER — Ambulatory Visit
Admission: RE | Admit: 2015-06-26 | Discharge: 2015-06-26 | Disposition: A | Discharge status: Home / Self Care | Payer: PPO | Source: Ambulatory Visit | Attending: Neurological Surgery | Admitting: Neurological Surgery

## 2015-06-26 DIAGNOSIS — M47812 Spondylosis without myelopathy or radiculopathy, cervical region: Secondary | ICD-10-CM | POA: Diagnosis not present

## 2015-06-26 DIAGNOSIS — M5412 Radiculopathy, cervical region: Secondary | ICD-10-CM

## 2015-06-26 MED ORDER — IOPAMIDOL (ISOVUE-M 300) INJECTION 61%
1.0000 mL | Freq: Once | INTRAMUSCULAR | Status: AC | PRN
Start: 2015-06-26 — End: 2015-06-26
  Administered 2015-06-26: 1 mL via EPIDURAL

## 2015-06-26 MED ORDER — TRIAMCINOLONE ACETONIDE 40 MG/ML IJ SUSP (RADIOLOGY)
60.0000 mg | Freq: Once | INTRAMUSCULAR | Status: AC
  Administered 2015-06-26: 60 mg via EPIDURAL

## 2015-06-26 NOTE — Discharge Instructions (Signed)

## 2015-07-24 DIAGNOSIS — J3089 Other allergic rhinitis: Secondary | ICD-10-CM | POA: Diagnosis not present

## 2015-07-24 DIAGNOSIS — R05 Cough: Secondary | ICD-10-CM | POA: Diagnosis not present

## 2015-07-24 DIAGNOSIS — H1045 Other chronic allergic conjunctivitis: Secondary | ICD-10-CM | POA: Diagnosis not present

## 2015-07-24 DIAGNOSIS — J309 Allergic rhinitis, unspecified: Secondary | ICD-10-CM | POA: Diagnosis not present

## 2015-07-24 DIAGNOSIS — J301 Allergic rhinitis due to pollen: Secondary | ICD-10-CM | POA: Diagnosis not present

## 2015-08-27 ENCOUNTER — Encounter (HOSPITAL_COMMUNITY): Payer: Self-pay | Admitting: Emergency Medicine

## 2015-08-27 ENCOUNTER — Emergency Department (HOSPITAL_COMMUNITY): Payer: PPO

## 2015-08-27 ENCOUNTER — Observation Stay (HOSPITAL_COMMUNITY)
Admission: EM | Admit: 2015-08-27 | Discharge: 2015-08-28 | Disposition: A | Discharge status: Home / Self Care | Payer: PPO | Attending: Cardiology | Admitting: Cardiology

## 2015-08-27 DIAGNOSIS — M549 Dorsalgia, unspecified: Secondary | ICD-10-CM | POA: Insufficient documentation

## 2015-08-27 DIAGNOSIS — R0602 Shortness of breath: Secondary | ICD-10-CM | POA: Diagnosis not present

## 2015-08-27 DIAGNOSIS — I10 Essential (primary) hypertension: Secondary | ICD-10-CM | POA: Insufficient documentation

## 2015-08-27 DIAGNOSIS — R7303 Prediabetes: Secondary | ICD-10-CM | POA: Diagnosis not present

## 2015-08-27 DIAGNOSIS — I499 Cardiac arrhythmia, unspecified: Secondary | ICD-10-CM | POA: Diagnosis not present

## 2015-08-27 DIAGNOSIS — R42 Dizziness and giddiness: Secondary | ICD-10-CM | POA: Diagnosis not present

## 2015-08-27 DIAGNOSIS — N400 Enlarged prostate without lower urinary tract symptoms: Secondary | ICD-10-CM | POA: Insufficient documentation

## 2015-08-27 DIAGNOSIS — G8929 Other chronic pain: Secondary | ICD-10-CM | POA: Insufficient documentation

## 2015-08-27 DIAGNOSIS — E785 Hyperlipidemia, unspecified: Secondary | ICD-10-CM | POA: Diagnosis not present

## 2015-08-27 DIAGNOSIS — I4891 Unspecified atrial fibrillation: Principal | ICD-10-CM | POA: Diagnosis present

## 2015-08-27 DIAGNOSIS — Z87891 Personal history of nicotine dependence: Secondary | ICD-10-CM | POA: Diagnosis not present

## 2015-08-27 DIAGNOSIS — K219 Gastro-esophageal reflux disease without esophagitis: Secondary | ICD-10-CM | POA: Insufficient documentation

## 2015-08-27 DIAGNOSIS — I480 Paroxysmal atrial fibrillation: Secondary | ICD-10-CM | POA: Diagnosis not present

## 2015-08-27 HISTORY — DX: Reserved for inherently not codable concepts without codable children: IMO0001

## 2015-08-27 HISTORY — DX: Family history of ischemic heart disease and other diseases of the circulatory system: Z82.49

## 2015-08-27 LAB — BASIC METABOLIC PANEL
Anion gap: 9 (ref 5–15)
BUN: 20 mg/dL (ref 6–20)
CALCIUM: 9.3 mg/dL (ref 8.9–10.3)
CO2: 22 mmol/L (ref 22–32)
Chloride: 108 mmol/L (ref 101–111)
Creatinine, Ser: 1.17 mg/dL (ref 0.61–1.24)
GFR calc non Af Amer: >60 mL/min (ref >60)
Glucose, Bld: 99 mg/dL (ref 65–99)
Potassium: 4 mmol/L (ref 3.5–5.1)
Sodium: 139 mmol/L (ref 135–145)

## 2015-08-27 LAB — CBC WITH DIFFERENTIAL/PLATELET
BASOS ABS: 0 10*3/uL (ref 0.0–0.1)
BASOS PCT: 0 %
EOS ABS: 0.1 10*3/uL (ref 0.0–0.7)
Eosinophils Relative: 1 %
HEMATOCRIT: 43.4 % (ref 39.0–52.0)
HEMOGLOBIN: 14.4 g/dL (ref 13.0–17.0)
Lymphocytes Relative: 22 %
Lymphs Abs: 1.8 10*3/uL (ref 0.7–4.0)
MCH: 32.2 pg (ref 26.0–34.0)
MCHC: 33.2 g/dL (ref 30.0–36.0)
MCV: 97.1 fL (ref 78.0–100.0)
Monocytes Absolute: 0.9 10*3/uL (ref 0.1–1.0)
Monocytes Relative: 11 %
NEUTROS ABS: 5.5 10*3/uL (ref 1.7–7.7)
NEUTROS PCT: 66 %
Platelets: 278 10*3/uL (ref 150–400)
RBC: 4.47 MIL/uL (ref 4.22–5.81)
RDW: 14 % (ref 11.5–15.5)
WBC: 8.3 10*3/uL (ref 4.0–10.5)

## 2015-08-27 LAB — I-STAT TROPONIN, ED: Troponin i, poc: 0 ng/mL (ref 0.00–0.08)

## 2015-08-27 LAB — TSH: TSH: 4.235 u[IU]/mL (ref 0.350–4.500)

## 2015-08-27 LAB — TROPONIN I: Troponin I: <0.03 ng/mL (ref <0.03)

## 2015-08-27 MED ORDER — RIVAROXABAN 20 MG PO TABS: 20.0000 mg | ORAL_TABLET | Freq: Every day | ORAL | Status: DC

## 2015-08-27 MED ORDER — DILTIAZEM HCL-DEXTROSE 100-5 MG/100ML-% IV SOLN (PREMIX)
5.0000 mg/h | INTRAVENOUS | Status: DC
  Administered 2015-08-27: 5 mg/h via INTRAVENOUS
  Administered 2015-08-27 – 2015-08-28 (×2): 10 mg/h via INTRAVENOUS
  Filled 2015-08-27 (×4): qty 100

## 2015-08-27 MED ORDER — METOPROLOL TARTRATE 25 MG PO TABS
25.0000 mg | ORAL_TABLET | Freq: Three times a day (TID) | ORAL | Status: DC
  Administered 2015-08-27 – 2015-08-28 (×2): 25 mg via ORAL
  Filled 2015-08-27 (×2): qty 1

## 2015-08-27 MED ORDER — ACETAMINOPHEN 325 MG PO TABS
650.0000 mg | ORAL_TABLET | ORAL | Status: DC | PRN
  Administered 2015-08-28: 650 mg via ORAL
  Filled 2015-08-27: qty 2

## 2015-08-27 MED ORDER — LOSARTAN POTASSIUM-HCTZ 100-25 MG PO TABS: 1.0000 | ORAL_TABLET | Freq: Every day | ORAL | Status: DC

## 2015-08-27 MED ORDER — HYDROCHLOROTHIAZIDE 25 MG PO TABS: 25.0000 mg | ORAL_TABLET | Freq: Every day | ORAL | Status: DC

## 2015-08-27 MED ORDER — ONDANSETRON HCL 4 MG/2ML IJ SOLN: 4.0000 mg | Freq: Four times a day (QID) | INTRAMUSCULAR | Status: DC | PRN

## 2015-08-27 MED ORDER — ATORVASTATIN CALCIUM 10 MG PO TABS: 10.0000 mg | ORAL_TABLET | Freq: Every day | ORAL | Status: DC

## 2015-08-27 MED ORDER — LOSARTAN POTASSIUM 50 MG PO TABS
100.0000 mg | ORAL_TABLET | Freq: Every day | ORAL | Status: DC
  Filled 2015-08-27 (×2): qty 2

## 2015-08-27 MED ORDER — FLUTICASONE PROPIONATE 50 MCG/ACT NA SUSP
1.0000 | Freq: Every day | NASAL | Status: DC | PRN
Start: 2015-08-27 — End: 2015-08-28

## 2015-08-27 MED ORDER — ZOLPIDEM TARTRATE 5 MG PO TABS
5.0000 mg | ORAL_TABLET | Freq: Every evening | ORAL | Status: DC | PRN
  Administered 2015-08-27: 5 mg via ORAL
  Filled 2015-08-27: qty 1

## 2015-08-27 MED ORDER — METOPROLOL TARTRATE 5 MG/5ML IV SOLN
5.0000 mg | Freq: Once | INTRAVENOUS | Status: AC
  Administered 2015-08-27: 5 mg via INTRAVENOUS
  Filled 2015-08-27: qty 5

## 2015-08-27 MED ORDER — RIVAROXABAN 20 MG PO TABS
20.0000 mg | ORAL_TABLET | Freq: Once | ORAL | Status: AC
  Administered 2015-08-27: 20 mg via ORAL
  Filled 2015-08-27: qty 1

## 2015-08-27 MED ORDER — COQ10 200 MG PO CAPS: 300.0000 mg | ORAL_CAPSULE | Freq: Every day | ORAL | Status: DC

## 2015-08-27 MED ORDER — DILTIAZEM LOAD VIA INFUSION
10.0000 mg | Freq: Once | INTRAVENOUS | Status: AC
  Administered 2015-08-27: 10 mg via INTRAVENOUS
  Filled 2015-08-27: qty 10

## 2015-08-27 MED ORDER — DILTIAZEM HCL ER COATED BEADS 120 MG PO CP24
120.0000 mg | ORAL_CAPSULE | Freq: Two times a day (BID) | ORAL | Status: DC
  Administered 2015-08-27 – 2015-08-28 (×2): 120 mg via ORAL
  Filled 2015-08-27 (×2): qty 1

## 2015-08-27 MED ORDER — PANTOPRAZOLE SODIUM 40 MG PO TBEC
40.0000 mg | DELAYED_RELEASE_TABLET | Freq: Every day | ORAL | Status: DC
  Administered 2015-08-28: 40 mg via ORAL
  Filled 2015-08-27: qty 1

## 2015-08-27 MED ORDER — LORATADINE 10 MG PO TABS
10.0000 mg | ORAL_TABLET | Freq: Every day | ORAL | Status: DC
Start: 2015-08-28 — End: 2015-08-28
  Administered 2015-08-28: 10 mg via ORAL
  Filled 2015-08-27: qty 1

## 2015-08-27 MED ORDER — ALBUTEROL SULFATE (2.5 MG/3ML) 0.083% IN NEBU: 3.0000 mL | INHALATION_SOLUTION | RESPIRATORY_TRACT | Status: DC | PRN

## 2015-08-27 MED ORDER — VITAMIN D3 25 MCG (1000 UNIT) PO TABS
5000.0000 [IU] | ORAL_TABLET | Freq: Every day | ORAL | Status: DC
  Administered 2015-08-28: 5000 [IU] via ORAL
  Filled 2015-08-27 (×2): qty 5

## 2015-08-27 MED ORDER — MONTELUKAST SODIUM 10 MG PO TABS
10.0000 mg | ORAL_TABLET | Freq: Every day | ORAL | Status: DC
  Administered 2015-08-27: 10 mg via ORAL
  Filled 2015-08-27: qty 1

## 2015-08-27 NOTE — ED Triage Notes (Signed)
Pt to ER BIB [facility] from doctors office where patient was being evaluated for dizziness x3-4 days. Pt has known hx of irregular heart beat however has never been diagnosed with atrial fibrillation. ECG revealed A fib RVR at 160 bpm. Pt denies chest pain, reports sob on exertion. Primary symptom associated with rate is dizziness. BP 100/76. Pt a/o x4. NAD on arrival.

## 2015-08-27 NOTE — H&P (Signed)
Christian [name] is an 70 y.o. male.   Chief Complaint: Palpitations 3 days duration HPI: Patient with known hypertension, hyperlipidemia and prediabetes mellitus, who started experiencing shortness of breath, worsening dyspnea over the past 3 days associated with marked fatigue. He felt his heart rate to be irregular, he was on vacation at the beach and hence did not seek any medical help. Due to worsening symptoms last night he presented to the emergency room today. He was found to be in atrial fibrillation with rapid ventricular response. Otherwise feels well, no chest pain, no PND or orthopnea. No symptoms to suggest TIA or claudication.  Past Medical History:  Diagnosis Date  . Arthritis   . BPH (benign prostatic hypertrophy)   . Congenital posterior urethral valve   . DDD (degenerative disc disease), cervical   . DDD (degenerative disc disease), lumbar   . Factor V deficiency ([place])   . Frequency of urination   . H/O bundle branch block   . History of gastroesophageal reflux (GERD)   . Hypertension   . Nocturia   . Shortness of breath dyspnea     Past Surgical History:  Procedure Laterality Date  . CATARACT EXTRACTION W/ INTRAOCULAR LENS  IMPLANT, BILATERAL  3 yrs ago  . COLONOSCOPY N/A 06/27/2014   Procedure: COLONOSCOPY;  Surgeon: Garlan Fair, MD;  Location: [facility];  Service: Endoscopy;  Laterality: N/A;  . NO PAST SURGERIES    . REPAIR CONGENITAL URETHRAL DEFECT  INFANT  . 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 Atrial fibrillation Father   . Atrial fibrillation Sister   . Factor V Leiden deficiency Sister   . Atrial fibrillation Brother   . Factor V Leiden deficiency Brother    Social History:   reports that he quit smoking about 20 years ago. His smoking use included Cigarettes. He has a 20.00 pack-year smoking history. He has never used smokeless tobacco. He reports that he drinks about 6.0 oz of alcohol per week . He reports that he does not use drugs.  Allergies:  Allergies  Allergen Reactions  . Gin [Alcohol] Other (See Comments)    Pins and needles feeling  . Adhesive [Tape] Other (See Comments)    Redness/ irritation  . Lobster [Shellfish Allergy] Other (See Comments)    Allergy test     (Not in a hospital admission)  Results for orders placed or performed during the hospital encounter of 08/27/15 (from the past 48 hour(s))  Basic metabolic panel     Status: None   Collection Time: 08/27/15 11:50 AM  Result Value Ref Range   Sodium 139 135 - 145 mmol/L   Potassium 4.0 3.5 - 5.1 mmol/L   Chloride 108 101 - 111 mmol/L   CO2 22 22 - 32 mmol/L   Glucose, Bld 99 65 - 99 mg/dL   BUN 20 6 - 20 mg/dL   Creatinine, Ser 1.17 0.61 - 1.24 mg/dL   Calcium 9.3 8.9 - 10.3 mg/dL   GFR calc non Af Amer >60 >60 mL/min   GFR calc Af Amer >60 >60 mL/min    Comment: (NOTE) The eGFR has been calculated using the CKD EPI equation. This calculation has not been validated in all clinical  situations. eGFR's persistently <60 mL/min signify possible Chronic Kidney Disease.    Anion gap 9 5 - 15  CBC with Differential     Status: None   Collection Time: 08/27/15 11:50 AM  Result Value Ref Range   WBC 8.3 4.0 - 10.5 K/uL   RBC 4.47 4.22 - 5.81 MIL/uL   Hemoglobin 14.4 13.0 - 17.0 g/dL   HCT 43.4 39.0 - 52.0 %   MCV 97.1 78.0 - 100.0 fL   MCH 32.2 26.0 - 34.0 pg   MCHC 33.2 30.0 - 36.0 g/dL   RDW 14.0 11.5 - 15.5 %   Platelets 278 150 - 400 K/uL   Neutrophils Relative % 66 %   Neutro Abs 5.5 1.7 - 7.7 K/uL   Lymphocytes Relative 22 %   Lymphs Abs 1.8 0.7 - 4.0 K/uL   Monocytes Relative 11 %   Monocytes Absolute 0.9 0.1 - 1.0 K/uL   Eosinophils Relative 1 %   Eosinophils  Absolute 0.1 0.0 - 0.7 K/uL   Basophils Relative 0 %   Basophils Absolute 0.0 0.0 - 0.1 K/uL  I-stat troponin, ED     Status: None   Collection Time: 08/27/15 12:06 PM  Result Value Ref Range   Troponin i, poc 0.00 0.00 - 0.08 ng/mL   Comment 3            Comment: Due to the release kinetics of cTnI, a negative result within the first hours of the onset of symptoms does not rule out myocardial infarction with certainty. If myocardial infarction is still suspected, repeat the test at appropriate intervals.    Dg Chest Portable 1 View  Result Date: 08/27/2015 CLINICAL DATA:  Dizziness and atrial fibrillation EXAM: PORTABLE CHEST 1 VIEW COMPARISON:  04/12/2015 FINDINGS: The heart size and mediastinal contours are within normal limits. Both lungs are clear. The visualized skeletal structures are unremarkable. IMPRESSION: No active disease. Electronically Signed   By: Inez Catalina M.D.   On: 08/27/2015 12:10    Review of Systems  Constitutional: Positive for malaise/fatigue.  Respiratory: Positive for shortness of breath. Negative for cough and wheezing.   Cardiovascular: Positive for palpitations. Negative for chest pain, orthopnea, claudication, leg swelling and PND.  Gastrointestinal: Negative for blood in stool, heartburn and vomiting.  Genitourinary: Negative for dysuria.  Musculoskeletal: Positive for joint pain.    Blood pressure 107/87, pulse 100, temperature 98.1 F (36.7 C), temperature source Oral, resp. rate 18, height 5' 11"  (1.803 m), weight 97.5 kg (215 lb), SpO2 96 %. Body mass index is 29.99 kg/m.  Physical Exam  Constitutional: He is oriented to person, place, and time. He appears well-developed.  Eyes: Conjunctivae are normal.  Neck: No thyromegaly present.  Cardiovascular: Exam reveals distant heart sounds.   S1 is variable, S2 is normal.  Respiratory: Effort normal and breath sounds normal.  GI: Soft. Normal appearance and bowel sounds are normal.   Neurological: He is alert and oriented to person, place, and time. He has normal strength.    EKG 08/27/2015: Atrial fibrillation with rapid ventricle response. Normal axis. Right bundle branch block. Nonspecific T abnormality.  Assessment/Plan 1. Atrial fibrillation with rapid ventricle response CHA2DS2-VASCScore: Risk Score  2,  Yearly risk of stroke  2.2. Recommendation: Anticoagulation   2. Hypertension 3. Hyperlipidemia 4. Hyperglycemia 5. Symptoms suggestive of obstructive sleep apnea with snoring, daytime somnolence and chronic fatigue, symptoms of fatigue worse in the last 3 days. 6. Shortness of breath and dyspnea on exertion new-onset  over the past few days. 7. Family history of atrial fibrillation.  Rec: Patient will be admitted for management of atrial fibrillation with rapid ventricular response and will be started on anticoagulation. As he had new onset of symptoms 3 days ago, suspect onset of A. fib to be 3 days. I have extensively discussed with the patient and his wife at the bedside that he will need long-term anticoagulation. Patient is well aware of atrial fibrillation as there is family history of A. fib, he is willing to be on long-term anticoagulation. I'll consider direct-current cardioversion and in 2-3 weeks after being fully anticoagulated.  Adrian Prows, MD 08/27/2015, 3:58 PM

## 2015-08-27 NOTE — Progress Notes (Signed)
Patient lying in bed, no needs at this time. Call light within reach. 

## 2015-08-27 NOTE — ED Provider Notes (Signed)
[place] DEPT Provider Note   CSN: [id number] Arrival date & time: 08/27/15  1110     History   Chief Complaint Chief Complaint  Patient presents with  . Irregular Heart Beat   HPI  DEANGLEO KUBECKA is an 70 y.o. male with history of HTN, HLD, GERD, heterozygous for factor V leiden, DDD who presents to the ED from his PCP office for evaluation of afib with RVR. He reports a history of his heart skipping beats but has never been diagnosed with afib. He states he was seen at [facility] today for evaluation of dizziness. He states for the past four days he has felt dizzy with standing. He states at times he feels lightheaded and there have been a couple times he has had to grab onto a nearby wall. He states the dizziness resolves with rest. He states he has also noticed associated dyspnea on exertion which is new for him. He states he tried going for a walk yesterday but "gave up" because he got too tired. He denies chest pain. He states he has noticed some bilateral equal ankle swelling over the past nine months. He also states he has had some cramping in both of his legs and sometimes his hands. Denies weakness, numbness. Denies facial droop or slurred speech. Denies blurry vision. He states that for the past three days he has been taking a baby aspirin at home but typically does not take ASA daily because he takes diclofenac. He states both his father and brother have afib. He states he had a stress test and a nuclear study "years ago" which were reportedly normal. Denies h/o blood clots, recent travel. He used to smoke but quit "many years ago." He does admit to drinking 3-4 glasses of wine daily and over this past weekend was at the beach and drank "too much" hard liquor as well. Denies other drug use.  Past Medical History:  Diagnosis Date  . Arthritis   . BPH (benign prostatic hypertrophy)   . Congenital posterior urethral valve   . DDD (degenerative disc disease), cervical   . DDD  (degenerative disc disease), lumbar   . Factor V deficiency ([place])   . Frequency of urination   . H/O bundle branch block   . History of gastroesophageal reflux (GERD)   . Hypertension   . Nocturia     Patient Active Problem List   Diagnosis Date Noted  . BPH (benign prostatic hyperplasia) 03/10/2011    Past Surgical History:  Procedure Laterality Date  . CATARACT EXTRACTION W/ INTRAOCULAR LENS  IMPLANT, BILATERAL  3 yrs ago  . COLONOSCOPY N/A 06/27/2014   Procedure: COLONOSCOPY;  Surgeon: Garlan Fair, MD;  Location: [facility];  Service: Endoscopy;  Laterality: N/A;  . REPAIR CONGENITAL URETHRAL DEFECT  INFANT  . 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 Authorizing Provider  amLODipine (NORVASC) 5 MG tablet Take 5 mg by mouth daily.    Historical Provider, MD  Cholecalciferol (D-3-5) 5000 UNITS capsule Take 5,000 Units by mouth daily.    Historical Provider, MD  Coenzyme Q10 (COQ10) 200 MG CAPS Take 1 capsule by mouth daily.    Historical  Provider, MD  diclofenac (VOLTAREN) 75 MG EC tablet Take 75 mg by mouth daily. AND PRN    Historical Provider, MD  fexofenadine (ALLEGRA) 180 MG tablet Take 180 mg by mouth daily.    Historical Provider, MD  fluticasone (FLONASE) 50 MCG/ACT nasal spray Place into both nostrils every other day.    Historical Provider, MD  losartan-hydrochlorothiazide (HYZAAR) 100-25 MG tablet Take 1 tablet by mouth daily.    Historical Provider, MD  Multiple Vitamins-Minerals (CENTRUM SILVER PO) Take 1 tablet by mouth daily.    Historical Provider, MD  omeprazole (PRILOSEC) 20 MG capsule Take 20 mg by mouth daily. TAKES PRILOSEC WITH  DICLOFENAC    Historical Provider, MD  Psyllium (METAMUCIL SMOOTH TEXTURE PO) Take by mouth daily.    Historical Provider, MD  simvastatin (ZOCOR) 20 MG tablet Take 20 mg by mouth daily.    Historical Provider, MD  zolpidem (AMBIEN) 5 MG tablet Take 5 mg by mouth at bedtime as needed.    Historical Provider, MD    Family History History reviewed. No pertinent family history.  Social History Social History  Substance Use Topics  . Smoking status: Former Smoker    Packs/day: 1.00    Years: 20.00    Types: Cigarettes    Quit date: 02/28/1995  . Smokeless tobacco: Never Used  . Alcohol use 6.0 oz/week    10 Shots of liquor per week     Allergies   Adhesive [tape]   Review of Systems Review of Systems 10 Systems reviewed and are negative for acute change except as noted in the HPI.  Physical Exam Updated Vital Signs BP 106/89 (BP Location: Right Arm)   Pulse (!) 147   Temp 98.1 F (36.7 C) (Oral)   Resp 13   Ht 5\' 11"  (1.803 m)   Wt 97.5 kg   SpO2 100%   BMI 29.99 kg/m   Physical Exam  Constitutional: He is oriented to person, place, and time.  HENT:  Right Ear: External ear normal.  Left Ear: External ear normal.  Nose: Nose normal.  Mouth/Throat: Oropharynx is clear and moist. No oropharyngeal exudate.  Eyes: Conjunctivae are normal.  No nystagmus  Neck: Neck supple.  Cardiovascular: Intact distal pulses.  An irregularly irregular rhythm present. Tachycardia present.   Pulmonary/Chest: Effort normal and breath sounds normal. No respiratory distress. He has no wheezes. He exhibits no tenderness.  Abdominal: Soft. Bowel sounds are normal. He exhibits no distension. There is no tenderness. There is no rebound and no guarding.  Musculoskeletal: He exhibits no edema.  No LE edema There is some tenderness along left medial calf. 2+ DP and PT pulses bilaterally  Lymphadenopathy:    He has no cervical adenopathy.  Neurological: He is alert and oriented to person,  place, and time. No cranial nerve deficit.  Moves all extremities freely. Strength intact throughout  Skin: Skin is warm and dry.  Psychiatric: He has a normal mood and affect.  Nursing note and vitals reviewed.    ED Treatments / Results  Labs (all labs ordered are listed, but only abnormal results are displayed) Labs Reviewed  BASIC METABOLIC PANEL  CBC WITH DIFFERENTIAL/PLATELET  Randolm Idol, ED    EKG  EKG Interpretation  Date/Time:  Monday August 27 2015 11:14:58 EDT Ventricular Rate:  142 PR Interval:    QRS Duration: 144 QT Interval:  344 QTC Calculation: 529 R Axis:   33 Text Interpretation:  Atrial fibrillation Right bundle branch block ST-t wave  abnormality Abnormal ekg Confirmed by Carmin Muskrat  MD [phone]) on 08/27/2015 11:21:13 AM       Radiology Dg Chest Portable 1 View  Result Date: 08/27/2015 CLINICAL DATA:  Dizziness and atrial fibrillation EXAM: PORTABLE CHEST 1 VIEW COMPARISON:  04/12/2015 FINDINGS: The heart size and mediastinal contours are within normal limits. Both lungs are clear. The visualized skeletal structures are unremarkable. IMPRESSION: No active disease. Electronically Signed   By: Inez Catalina M.D.   On: 08/27/2015 12:10    Procedures Procedures (including critical care time)  Medications Ordered in ED Medications  diltiazem (CARDIZEM) 1 mg/mL load via infusion 10 mg (10 mg Intravenous Bolus from Bag 08/27/15 1149)    And  diltiazem (CARDIZEM) 100 mg in dextrose 5% 184mL (1 mg/mL) infusion (10 mg/hr Intravenous Rate/Dose Change 08/27/15 1334)  metoprolol (LOPRESSOR) injection 5 mg (5 mg Intravenous Given 08/27/15 1331)  rivaroxaban (XARELTO) tablet 20 mg (20 mg Oral Given 08/27/15 1432)     Initial Impression / Assessment and Plan / ED Course  I have reviewed the triage vital signs and the nursing notes.  Pertinent labs & imaging results that were available during my care of the patient were reviewed by me and considered in my  medical decision making (see chart for details).  Clinical Course    11:44 AM Case discussed with attending Dr. Vanita Panda who will see pt at bedside as well. Will start on a diltiazem 10mg  bolus (pressures are on the soft side) and infusion. CBC, BMP, troponin, and CXR ordered. EKG is in afib with RVR. Rates 120-150 in the room.  1:03 PM Rates improved to the low 100s now on diltiazem drip. Labs otherwise unrevealing. Spoke with Dr. Einar Gip who recommends giving metoprolol 5mg  and he will come see and admit pt. Appreciate assistance.  Final Clinical Impressions(s) / ED Diagnoses   Final diagnoses:  Atrial fibrillation with RVR [facility])    New Prescriptions New Prescriptions   No medications on file     Carmelina Dane 08/27/15 1509    Carmin Muskrat, MD 08/27/15 1655

## 2015-08-28 DIAGNOSIS — I10 Essential (primary) hypertension: Secondary | ICD-10-CM | POA: Diagnosis not present

## 2015-08-28 DIAGNOSIS — R7303 Prediabetes: Secondary | ICD-10-CM | POA: Diagnosis not present

## 2015-08-28 DIAGNOSIS — G8929 Other chronic pain: Secondary | ICD-10-CM | POA: Diagnosis not present

## 2015-08-28 DIAGNOSIS — K219 Gastro-esophageal reflux disease without esophagitis: Secondary | ICD-10-CM | POA: Diagnosis not present

## 2015-08-28 DIAGNOSIS — I4891 Unspecified atrial fibrillation: Secondary | ICD-10-CM | POA: Diagnosis not present

## 2015-08-28 DIAGNOSIS — Z87891 Personal history of nicotine dependence: Secondary | ICD-10-CM | POA: Diagnosis not present

## 2015-08-28 DIAGNOSIS — N400 Enlarged prostate without lower urinary tract symptoms: Secondary | ICD-10-CM | POA: Diagnosis not present

## 2015-08-28 DIAGNOSIS — M549 Dorsalgia, unspecified: Secondary | ICD-10-CM | POA: Diagnosis not present

## 2015-08-28 DIAGNOSIS — E785 Hyperlipidemia, unspecified: Secondary | ICD-10-CM | POA: Diagnosis not present

## 2015-08-28 LAB — TROPONIN I: Troponin I: <0.03 ng/mL (ref <0.03)

## 2015-08-28 LAB — LIPID PANEL
Cholesterol: 148 mg/dL (ref 0–200)
HDL: 45 mg/dL (ref >40)
LDL CALC: 72 mg/dL (ref 0–99)
TRIGLYCERIDES: 157 mg/dL — AB (ref <150)
Total CHOL/HDL Ratio: 3.3 RATIO
VLDL: 31 mg/dL (ref 0–40)

## 2015-08-28 MED ORDER — RIVAROXABAN 20 MG PO TABS: 20.0000 mg | ORAL_TABLET | Freq: Every day | ORAL | 2 refills | Status: DC

## 2015-08-28 MED ORDER — OFF THE BEAT BOOK
Freq: Once | Status: DC
  Filled 2015-08-28: qty 1

## 2015-08-28 MED ORDER — TRAMADOL HCL 50 MG PO TABS: 50.0000 mg | ORAL_TABLET | Freq: Four times a day (QID) | ORAL | 1 refills | Status: DC | PRN

## 2015-08-28 MED ORDER — ATORVASTATIN CALCIUM 10 MG PO TABS: 10.0000 mg | ORAL_TABLET | Freq: Every day | ORAL | 2 refills | Status: DC

## 2015-08-28 MED ORDER — METOPROLOL TARTRATE 25 MG PO TABS: 25.0000 mg | ORAL_TABLET | Freq: Three times a day (TID) | ORAL | 1 refills | Status: DC

## 2015-08-28 NOTE — Progress Notes (Signed)
Christian [name] to be D/C'd Home per MD order. Discussed with the patient and all questions fully answered.    Medication List    STOP taking these medications   amLODipine 5 MG tablet Commonly known as:  NORVASC   aspirin EC 81 MG tablet   diclofenac 75 MG EC tablet Commonly known as:  VOLTAREN   losartan-hydrochlorothiazide 100-25 MG tablet Commonly known as:  HYZAAR   simvastatin 20 MG tablet Commonly known as:  ZOCOR     TAKE these medications   atorvastatin 10 MG tablet Commonly known as:  LIPITOR Take 1 tablet (10 mg total) by mouth daily at 6 PM.   CENTRUM SILVER PO Take 1 tablet by mouth daily.   CoQ10 200 MG Caps Take 300 mg by mouth daily.   D-3-5 5000 units capsule Generic drug:  Cholecalciferol Take 5,000 Units by mouth daily.   fexofenadine 180 MG tablet Commonly known as:  ALLEGRA Take 180 mg by mouth daily.   fluticasone 50 MCG/ACT nasal spray Commonly known as:  FLONASE Place 1-2 sprays into both nostrils daily as needed for allergies.   metoprolol tartrate 25 MG tablet Commonly known as:  LOPRESSOR Take 1 tablet (25 mg total) by mouth 3 (three) times daily.   montelukast 10 MG tablet Commonly known as:  SINGULAIR Take 10 mg by mouth at bedtime.   omeprazole 20 MG capsule Commonly known as:  PRILOSEC Take 20 mg by mouth daily. TAKES PRILOSEC WITH DICLOFENAC   PROAIR RESPICLICK 123XX123 (90 Base) MCG/ACT Aepb Generic drug:  Albuterol Sulfate Inhale 1 puff into the lungs daily.   rivaroxaban 20 MG Tabs tablet Commonly known as:  XARELTO Take 1 tablet (20 mg total) by mouth daily after supper.   traMADol 50 MG tablet Commonly known as:  ULTRAM Take 1 tablet (50 mg total) by mouth every 6 (six) hours as needed for moderate pain.   zolpidem 5 MG tablet Commonly known as:  AMBIEN Take 5 mg by mouth at bedtime as needed for sleep.       VVS, Skin clean, dry and intact without evidence of skin break down, no evidence of skin tears noted.  IV  catheter discontinued intact. Site without signs and symptoms of complications. Dressing and pressure applied.  An After Visit Summary was printed and given to the patient.  Patient escorted via [place], and D/C home via private auto.  Cyndra Numbers  08/28/2015 5:18 PM

## 2015-08-28 NOTE — Discharge Summary (Signed)
Physician Discharge Summary  Patient ID: Christian [name] MRN: [id number] DOB/AGE: Oct 24, 1945 70 y.o.  Admit date: 08/27/2015 Discharge date: 08/28/2015  Primary Discharge Diagnosis New-onset atrial fibrillation with rapid ventricular response CHA2DS2-VASCScore: Risk Score  2,  Yearly risk of stroke  2.2. Recommendation: Anticoagulation (if DM added, score is 3). Secondary Discharge Diagnosis Hypertension Hyperlipidemia Hyperglycemia Symptoms suggestive of obstructive sleep apnea Chronic back pain, patient advised not to take Voltaren, prescribed Ultram.  Hospital Course: Patient presented to the emergency room with 3 days onset of shortness of breath, fatigue worse than baseline fatigue, palpitations and was found to be in atrial fibrillation with rapid ventricular response.  He was started on Xarelto for anticoagulation, was started on intravenous diltiazem for rate control and also started on metoprolol.  The following morning his heart rate was well controlled, intravenous diltiazem was discontinued and on walking his heart rate remained stable, hence felt stable for discharge.  Recommendations on discharge:  Patient will be discharged home on metoprolol and diltiazem for rate control, we'll continue anticoagulation for at least 3 weeks prior to planned direct current cardioversion.  He was ruled out for myocardial infarction, he will need outpatient stress testing and echocardiogram.  Discharge Exam: Blood pressure 104/65, pulse 84, temperature 97.7 F (36.5 C), temperature source Oral, resp. rate 18, height 5\' 11"  (1.803 m), weight 97.5 kg (215 lb), SpO2 96 %.    General appearance: alert, cooperative, appears stated age and no distress Resp: clear to auscultation bilaterally Cardio: S1: Variable intensity,, S2: normal, no S3 or S4 and no murmur GI: soft, non-tender; bowel sounds normal; no masses,  no organomegaly Extremities: extremities normal, atraumatic, no cyanosis or  edema Pulses: 2+ and symmetric Neurologic: Grossly normal Labs:   Lab Results  Component Value Date   WBC 8.3 08/27/2015   HGB 14.4 08/27/2015   HCT 43.4 08/27/2015   MCV 97.1 08/27/2015   PLT 278 08/27/2015    Recent Labs Lab 08/27/15 1150  NA 139  K 4.0  CL 108  CO2 22  BUN 20  CREATININE 1.17  CALCIUM 9.3  GLUCOSE 99    Lipid Panel     Component Value Date/Time   CHOL 148 08/28/2015 0048   TRIG 157 (H) 08/28/2015 0048   HDL 45 08/28/2015 0048   CHOLHDL 3.3 08/28/2015 0048   VLDL 31 08/28/2015 0048   LDLCALC 72 08/28/2015 0048    Recent Labs  08/27/15 1906 08/28/15 0117 08/28/15 0723  TROPONINI <0.03 <0.03 <0.03    Recent Labs  08/27/15 1906  TSH 4.235    EKG: atrial fibrillation, rate controlled, normal axis, right bundle branch block.Nonspecific ST-T abnormality.   Radiology: Dg Chest Portable 1 View  Result Date: 08/27/2015 CLINICAL DATA:  Dizziness and atrial fibrillation EXAM: PORTABLE CHEST 1 VIEW COMPARISON:  04/12/2015 FINDINGS: The heart size and mediastinal contours are within normal limits. Both lungs are clear. The visualized skeletal structures are unremarkable. IMPRESSION: No active disease. Electronically Signed   By: Inez Catalina M.D.   On: 08/27/2015 12:10      FOLLOW UP PLANS AND APPOINTMENTS    Medication List    STOP taking these medications   amLODipine 5 MG tablet Commonly known as:  NORVASC   aspirin EC 81 MG tablet   diclofenac 75 MG EC tablet Commonly known as:  VOLTAREN   losartan-hydrochlorothiazide 100-25 MG tablet Commonly known as:  HYZAAR   simvastatin 20 MG tablet Commonly known as:  ZOCOR     TAKE  these medications   atorvastatin 10 MG tablet Commonly known as:  LIPITOR Take 1 tablet (10 mg total) by mouth daily at 6 PM.   CENTRUM SILVER PO Take 1 tablet by mouth daily.   CoQ10 200 MG Caps Take 300 mg by mouth daily.   D-3-5 5000 units capsule Generic drug:  Cholecalciferol Take 5,000  Units by mouth daily.   fexofenadine 180 MG tablet Commonly known as:  ALLEGRA Take 180 mg by mouth daily.   fluticasone 50 MCG/ACT nasal spray Commonly known as:  FLONASE Place 1-2 sprays into both nostrils daily as needed for allergies.   metoprolol tartrate 25 MG tablet Commonly known as:  LOPRESSOR Take 1 tablet (25 mg total) by mouth 3 (three) times daily.   montelukast 10 MG tablet Commonly known as:  SINGULAIR Take 10 mg by mouth at bedtime.   omeprazole 20 MG capsule Commonly known as:  PRILOSEC Take 20 mg by mouth daily. TAKES PRILOSEC WITH DICLOFENAC   PROAIR RESPICLICK 123XX123 (90 Base) MCG/ACT Aepb Generic drug:  Albuterol Sulfate Inhale 1 puff into the lungs daily.   rivaroxaban 20 MG Tabs tablet Commonly known as:  XARELTO Take 1 tablet (20 mg total) by mouth daily after supper.   traMADol 50 MG tablet Commonly known as:  ULTRAM Take 1 tablet (50 mg total) by mouth every 6 (six) hours as needed for moderate pain.   zolpidem 5 MG tablet Commonly known as:  AMBIEN Take 5 mg by mouth at bedtime as needed for sleep.      Follow-up Information    Adrian Prows, MD. Call today.   Specialty:  Cardiology Why:  We will call you to schedule Echocardiogram and stress test and follow up in one day Contact information: [address] [place] [address] [phone]          Adrian Prows, MD 08/28/2015, 12:29 PM  Pager: [phone] Office: [phone] If no answer: [phone]

## 2015-08-28 NOTE — Care Management Note (Signed)
Case Management Note Marvetta Gibbons RN, BSN Unit 2W-Case Manager [phone]  Patient Details  Name: Christian [name] MRN: [id number] Date of Birth: [birth year]  Subjective/Objective:    Pt admitted with afib                Action/Plan: PTA pt lived at home with wife- plan to d/c home today- has been started on Xarelto- spoke with pt at bedside- 30 day free card given to pt- pt ready to go  And does not want to wait on insurance check for benefit coverage have instructed pt to have pharmacy check on what copay will be- and let him know if he needs to f/u with his doctor for any pre-auth needed.   Expected Discharge Date:    08/28/15              Expected Discharge Plan:  Home/Self Care  In-House Referral:     Discharge planning Services  CM Consult, Medication Assistance  Post Acute Care Choice:    Choice offered to:     DME Arranged:    DME Agency:     HH Arranged:    HH Agency:     Status of Service:  Completed, signed off  If discussed at [employer] of Stay Meetings, dates discussed:    Additional Comments:  Dawayne Patricia, RN 08/28/2015, 3:41 PM

## 2015-08-28 NOTE — Progress Notes (Signed)
Patient's BP down from 98/73 to 94/61. Heart rate has also decreased, highest is in the low 80's. Titrated drip down to 7.5, will continue to monitor.

## 2015-08-28 NOTE — Discharge Instructions (Addendum)
Atrial Fibrillation Atrial fibrillation is a type of heartbeat that is irregular or fast (rapid). If you have this condition, your heart keeps quivering in a weird (chaotic) way. This condition can make it so your heart cannot pump blood normally. Having this condition gives a person more risk for stroke, heart failure, and other heart problems. There are different types of atrial fibrillation. Talk with your doctor to learn about the type that you have. HOME CARE  Take over-the-counter and prescription medicines only as told by your doctor.  If your doctor prescribed a blood-thinning medicine, take it exactly as told. Taking too much of it can cause bleeding. If you do not take enough of it, you will not have the protection that you need against stroke and other problems.  Do not use any tobacco products. These include cigarettes, chewing tobacco, and e-cigarettes. If you need help quitting, ask your doctor.  If you have apnea (obstructive sleep apnea), manage it as told by your doctor.  Do not drink alcohol.  Do not drink beverages that have caffeine. These include coffee, soda, and tea.  Maintain a healthy weight. Do not use diet pills unless your doctor says they are safe for you. Diet pills may make heart problems worse.  Follow diet instructions as told by your doctor.  Exercise regularly as told by your doctor.  Keep all follow-up visits as told by your doctor. This is important. GET HELP IF:  You notice a change in the speed, rhythm, or strength of your heartbeat.  You are taking a blood-thinning medicine and you notice more bruising.  You get tired more easily when you move or exercise. GET HELP RIGHT AWAY IF:  You have pain in your chest or your belly (abdomen).  You have sweating or weakness.  You feel sick to your stomach (nauseous).  You notice blood in your throw up (vomit), poop (stool), or pee (urine).  You are short of breath.  You suddenly have swollen feet  and ankles.  You feel dizzy.  Your suddenly get weak or numb in your face, arms, or legs, especially if it happens on one side of your body.  You have trouble talking, trouble understanding, or both.  Your face or your eyelid droops on one side. These symptoms may be an emergency. Do not wait to see if the symptoms will go away. Get medical help right away. Call your local emergency services (911 in the [place] Do not drive yourself to the hospital.   This information is not intended to replace advice given to you by your health care provider. Make sure you discuss any questions you have with your health care provider.   Document Released: 10/09/2007 Document Revised: 09/20/2014 Document Reviewed: 04/26/2014 Elsevier Interactive Patient Education 2016 [place] on my medicine - XARELTO (Rivaroxaban)  This medication education was reviewed with me or my healthcare representative as part of my discharge preparation.  Why was Xarelto prescribed for you? Xarelto was prescribed for you to reduce the risk of a blood clot forming that can cause a stroke if you have a medical condition called atrial fibrillation (a type of irregular heartbeat).  What do you need to know about xarelto ? Take your Xarelto ONCE DAILY at the same time every day with your evening meal. If you have difficulty swallowing the tablet whole, you may crush it and mix in applesauce just prior to taking your dose.  Take Xarelto exactly as prescribed by your doctor and  DO NOT stop taking Xarelto without talking to the doctor who prescribed the medication.  Stopping without other stroke prevention medication to take the place of Xarelto may increase your risk of developing a clot that causes a stroke.  Refill your prescription before you run out.  After discharge, you should have regular check-up appointments with your healthcare provider that is prescribing your Xarelto.  In the future your dose may  need to be changed if your kidney function or weight changes by a significant amount.  What do you do if you miss a dose? If you are taking Xarelto ONCE DAILY and you miss a dose, take it as soon as you remember on the same day then continue your regularly scheduled once daily regimen the next day. Do not take two doses of Xarelto at the same time or on the same day.   Important Safety Information A possible side effect of Xarelto is bleeding. You should call your healthcare provider right away if you experience any of the following: ? Bleeding from an injury or your nose that does not stop. ? Unusual colored urine (red or dark brown) or unusual colored stools (red or black). ? Unusual bruising for unknown reasons. ? A serious fall or if you hit your head (even if there is no bleeding).  Some medicines may interact with Xarelto and might increase your risk of bleeding while on Xarelto. To help avoid this, consult your healthcare provider or pharmacist prior to using any new prescription or non-prescription medications, including herbals, vitamins, non-steroidal anti-inflammatory drugs (NSAIDs) and supplements.  This website has more information on Xarelto: [email or web address]

## 2015-08-28 NOTE — Care Management Obs Status (Signed)
[place] NOTIFICATION   Patient Details  Name: Christian [name] MRN: [id number] Date of Birth: [birth year]   Medicare Observation Status Notification Given:  Yes    Dawayne Patricia, RN 08/28/2015, 3:24 PM

## 2015-08-29 LAB — HEMOGLOBIN A1C
HEMOGLOBIN A1C: 5.9 % — AB (ref 4.8–5.6)
Mean Plasma Glucose: 123 mg/dL

## 2015-09-03 DIAGNOSIS — I10 Essential (primary) hypertension: Secondary | ICD-10-CM | POA: Diagnosis not present

## 2015-09-03 DIAGNOSIS — R9431 Abnormal electrocardiogram [ECG] [EKG]: Secondary | ICD-10-CM | POA: Diagnosis not present

## 2015-09-03 DIAGNOSIS — I4891 Unspecified atrial fibrillation: Secondary | ICD-10-CM | POA: Diagnosis not present

## 2015-09-05 DIAGNOSIS — I10 Essential (primary) hypertension: Secondary | ICD-10-CM | POA: Diagnosis not present

## 2015-09-05 DIAGNOSIS — R9431 Abnormal electrocardiogram [ECG] [EKG]: Secondary | ICD-10-CM | POA: Diagnosis not present

## 2015-09-05 DIAGNOSIS — I4891 Unspecified atrial fibrillation: Secondary | ICD-10-CM | POA: Diagnosis not present

## 2015-09-10 DIAGNOSIS — M5412 Radiculopathy, cervical region: Secondary | ICD-10-CM | POA: Diagnosis not present

## 2015-09-10 DIAGNOSIS — Z6831 Body mass index (BMI) 31.0-31.9, adult: Secondary | ICD-10-CM | POA: Diagnosis not present

## 2015-09-11 DIAGNOSIS — M47812 Spondylosis without myelopathy or radiculopathy, cervical region: Secondary | ICD-10-CM | POA: Diagnosis not present

## 2015-09-11 DIAGNOSIS — I10 Essential (primary) hypertension: Secondary | ICD-10-CM | POA: Diagnosis not present

## 2015-09-11 DIAGNOSIS — I4891 Unspecified atrial fibrillation: Secondary | ICD-10-CM | POA: Diagnosis not present

## 2015-09-11 DIAGNOSIS — R51 Headache: Secondary | ICD-10-CM | POA: Diagnosis not present

## 2015-09-12 DIAGNOSIS — R0602 Shortness of breath: Secondary | ICD-10-CM | POA: Diagnosis not present

## 2015-09-12 DIAGNOSIS — I4891 Unspecified atrial fibrillation: Secondary | ICD-10-CM | POA: Diagnosis not present

## 2015-09-12 DIAGNOSIS — I10 Essential (primary) hypertension: Secondary | ICD-10-CM | POA: Diagnosis not present

## 2015-09-12 DIAGNOSIS — E785 Hyperlipidemia, unspecified: Secondary | ICD-10-CM | POA: Diagnosis not present

## 2015-10-17 DIAGNOSIS — Z86010 Personal history of colonic polyps: Secondary | ICD-10-CM | POA: Diagnosis not present

## 2015-10-17 DIAGNOSIS — I10 Essential (primary) hypertension: Secondary | ICD-10-CM | POA: Diagnosis not present

## 2015-10-17 DIAGNOSIS — J309 Allergic rhinitis, unspecified: Secondary | ICD-10-CM | POA: Diagnosis not present

## 2015-10-17 DIAGNOSIS — E559 Vitamin D deficiency, unspecified: Secondary | ICD-10-CM | POA: Diagnosis not present

## 2015-10-17 DIAGNOSIS — Z1389 Encounter for screening for other disorder: Secondary | ICD-10-CM | POA: Diagnosis not present

## 2015-10-17 DIAGNOSIS — I4891 Unspecified atrial fibrillation: Secondary | ICD-10-CM | POA: Diagnosis not present

## 2015-10-17 DIAGNOSIS — H6121 Impacted cerumen, right ear: Secondary | ICD-10-CM | POA: Diagnosis not present

## 2015-10-17 DIAGNOSIS — N400 Enlarged prostate without lower urinary tract symptoms: Secondary | ICD-10-CM | POA: Diagnosis not present

## 2015-10-17 DIAGNOSIS — R7303 Prediabetes: Secondary | ICD-10-CM | POA: Diagnosis not present

## 2015-10-17 DIAGNOSIS — Z0001 Encounter for general adult medical examination with abnormal findings: Secondary | ICD-10-CM | POA: Diagnosis not present

## 2015-10-17 DIAGNOSIS — M545 Low back pain: Secondary | ICD-10-CM | POA: Diagnosis not present

## 2015-10-17 DIAGNOSIS — E7800 Pure hypercholesterolemia, unspecified: Secondary | ICD-10-CM | POA: Diagnosis not present

## 2015-10-18 DIAGNOSIS — H04123 Dry eye syndrome of bilateral lacrimal glands: Secondary | ICD-10-CM | POA: Diagnosis not present

## 2015-10-24 DIAGNOSIS — M5441 Lumbago with sciatica, right side: Secondary | ICD-10-CM | POA: Diagnosis not present

## 2015-10-30 ENCOUNTER — Other Ambulatory Visit: Payer: Self-pay | Admitting: Neurological Surgery

## 2015-10-30 DIAGNOSIS — M5412 Radiculopathy, cervical region: Secondary | ICD-10-CM

## 2015-11-03 DIAGNOSIS — M545 Low back pain: Secondary | ICD-10-CM | POA: Diagnosis not present

## 2015-11-05 DIAGNOSIS — M5441 Lumbago with sciatica, right side: Secondary | ICD-10-CM | POA: Diagnosis not present

## 2015-11-06 DIAGNOSIS — Z23 Encounter for immunization: Secondary | ICD-10-CM | POA: Diagnosis not present

## 2015-11-06 DIAGNOSIS — D1722 Benign lipomatous neoplasm of skin and subcutaneous tissue of left arm: Secondary | ICD-10-CM | POA: Diagnosis not present

## 2015-11-06 DIAGNOSIS — L821 Other seborrheic keratosis: Secondary | ICD-10-CM | POA: Diagnosis not present

## 2015-11-06 DIAGNOSIS — D2272 Melanocytic nevi of left lower limb, including hip: Secondary | ICD-10-CM | POA: Diagnosis not present

## 2015-11-07 DIAGNOSIS — M545 Low back pain: Secondary | ICD-10-CM | POA: Diagnosis not present

## 2015-11-07 DIAGNOSIS — M5441 Lumbago with sciatica, right side: Secondary | ICD-10-CM | POA: Diagnosis not present

## 2015-11-08 ENCOUNTER — Ambulatory Visit
Admission: RE | Admit: 2015-11-08 | Discharge: 2015-11-08 | Disposition: A | Discharge status: Home / Self Care | Payer: PPO | Source: Ambulatory Visit | Attending: Neurological Surgery | Admitting: Neurological Surgery

## 2015-11-08 DIAGNOSIS — M5412 Radiculopathy, cervical region: Secondary | ICD-10-CM

## 2015-11-08 MED ORDER — TRIAMCINOLONE ACETONIDE 40 MG/ML IJ SUSP (RADIOLOGY)
60.0000 mg | Freq: Once | INTRAMUSCULAR | Status: AC
  Administered 2015-11-08: 60 mg via EPIDURAL

## 2015-11-08 MED ORDER — IOPAMIDOL (ISOVUE-M 300) INJECTION 61%
1.0000 mL | Freq: Once | INTRAMUSCULAR | Status: AC | PRN
  Administered 2015-11-08: 1 mL via EPIDURAL

## 2015-11-08 NOTE — Discharge Instructions (Signed)

## 2015-11-12 DIAGNOSIS — M5441 Lumbago with sciatica, right side: Secondary | ICD-10-CM | POA: Diagnosis not present

## 2015-11-12 DIAGNOSIS — M545 Low back pain: Secondary | ICD-10-CM | POA: Diagnosis not present

## 2015-11-15 DIAGNOSIS — M5441 Lumbago with sciatica, right side: Secondary | ICD-10-CM | POA: Diagnosis not present

## 2015-11-15 DIAGNOSIS — M545 Low back pain: Secondary | ICD-10-CM | POA: Diagnosis not present

## 2015-11-30 DIAGNOSIS — J209 Acute bronchitis, unspecified: Secondary | ICD-10-CM | POA: Diagnosis not present

## 2015-11-30 DIAGNOSIS — R05 Cough: Secondary | ICD-10-CM | POA: Diagnosis not present

## 2015-11-30 DIAGNOSIS — M5441 Lumbago with sciatica, right side: Secondary | ICD-10-CM | POA: Diagnosis not present

## 2015-11-30 DIAGNOSIS — M545 Low back pain: Secondary | ICD-10-CM | POA: Diagnosis not present

## 2015-12-03 DIAGNOSIS — M5441 Lumbago with sciatica, right side: Secondary | ICD-10-CM | POA: Diagnosis not present

## 2015-12-03 DIAGNOSIS — M545 Low back pain: Secondary | ICD-10-CM | POA: Diagnosis not present

## 2015-12-24 DIAGNOSIS — M545 Low back pain: Secondary | ICD-10-CM | POA: Diagnosis not present

## 2015-12-24 DIAGNOSIS — M5441 Lumbago with sciatica, right side: Secondary | ICD-10-CM | POA: Diagnosis not present

## 2015-12-26 DIAGNOSIS — M5441 Lumbago with sciatica, right side: Secondary | ICD-10-CM | POA: Diagnosis not present

## 2015-12-27 DIAGNOSIS — I4891 Unspecified atrial fibrillation: Secondary | ICD-10-CM | POA: Diagnosis not present

## 2015-12-27 DIAGNOSIS — I10 Essential (primary) hypertension: Secondary | ICD-10-CM | POA: Diagnosis not present

## 2016-01-04 DIAGNOSIS — N401 Enlarged prostate with lower urinary tract symptoms: Secondary | ICD-10-CM | POA: Diagnosis not present

## 2016-01-04 DIAGNOSIS — R351 Nocturia: Secondary | ICD-10-CM | POA: Diagnosis not present

## 2016-01-04 DIAGNOSIS — N281 Cyst of kidney, acquired: Secondary | ICD-10-CM | POA: Diagnosis not present

## 2016-01-04 DIAGNOSIS — N529 Male erectile dysfunction, unspecified: Secondary | ICD-10-CM | POA: Diagnosis not present

## 2016-01-10 DIAGNOSIS — I8393 Asymptomatic varicose veins of bilateral lower extremities: Secondary | ICD-10-CM | POA: Diagnosis not present

## 2016-01-21 DIAGNOSIS — M542 Cervicalgia: Secondary | ICD-10-CM | POA: Diagnosis not present

## 2016-01-21 DIAGNOSIS — M5441 Lumbago with sciatica, right side: Secondary | ICD-10-CM | POA: Diagnosis not present

## 2016-02-05 DIAGNOSIS — H1045 Other chronic allergic conjunctivitis: Secondary | ICD-10-CM | POA: Diagnosis not present

## 2016-02-05 DIAGNOSIS — J3089 Other allergic rhinitis: Secondary | ICD-10-CM | POA: Diagnosis not present

## 2016-02-05 DIAGNOSIS — J301 Allergic rhinitis due to pollen: Secondary | ICD-10-CM | POA: Diagnosis not present

## 2016-02-05 DIAGNOSIS — R05 Cough: Secondary | ICD-10-CM | POA: Diagnosis not present

## 2016-02-06 DIAGNOSIS — M47816 Spondylosis without myelopathy or radiculopathy, lumbar region: Secondary | ICD-10-CM | POA: Diagnosis not present

## 2016-02-21 DIAGNOSIS — M47816 Spondylosis without myelopathy or radiculopathy, lumbar region: Secondary | ICD-10-CM | POA: Diagnosis not present

## 2016-02-22 ENCOUNTER — Encounter: Payer: Self-pay | Admitting: Vascular Surgery

## 2016-02-25 ENCOUNTER — Encounter: Payer: Self-pay | Admitting: Vascular Surgery

## 2016-03-03 ENCOUNTER — Encounter: Payer: Self-pay | Admitting: Vascular Surgery

## 2016-03-03 ENCOUNTER — Ambulatory Visit (INDEPENDENT_AMBULATORY_CARE_PROVIDER_SITE_OTHER): Payer: PPO | Admitting: Vascular Surgery

## 2016-03-03 VITALS — BP 126/89 | HR 63 | Temp 97.5°F | Resp 18 | Ht 71.0 in | Wt 213.7 lb

## 2016-03-03 DIAGNOSIS — I83893 Varicose veins of bilateral lower extremities with other complications: Secondary | ICD-10-CM

## 2016-03-03 NOTE — Progress Notes (Signed)
Subjective:     Patient ID: Christian [name], male   DOB: May 30, 1945, 71 y.o.   MRN: [id number]  HPI This 71 year old male was referred by Dr. Mertha Finders for evaluation of bilateral painful varicose veins. He has had these varicosities which are quite extensive for many years-greater than 10. He does have a remote history of superficial thrombophlebitis. He has a diagnosis of factor V Leiden deficiency which was diagnosed because he had a family history of this. He also has an eight-month history of atrial fibrillation which required treatment with anticoagulation-Xarelto. He stays in sinus rhythm majority of the time he states and has had no embolic events. He has no history of DVT stasis ulcers or bleeding. He does develop aching discomfort in both legs is a day progress. He has tried elastic compression stockings in the past but not recently but these cause significant pain. His symptoms are affecting his daily living.  Past Medical History:  Diagnosis Date  . Arthritis   . BPH (benign prostatic hypertrophy)   . Congenital posterior urethral valve   . DDD (degenerative disc disease), cervical   . DDD (degenerative disc disease), lumbar   . Factor V deficiency ([place])   . Family history of atrial fibrillation    His brother, sister have A. fib, suspects his father also had A. fib  . Frequency of urination   . H/O bundle branch block   . History of gastroesophageal reflux (GERD)   . Hypertension   . Nocturia   . Shortness of breath dyspnea     Social History  Substance Use Topics  . Smoking status: Former Smoker    Packs/day: 1.00    Years: 20.00    Types: Cigarettes    Quit date: 02/28/1995  . Smokeless tobacco: Never Used  . Alcohol use 6.0 oz/week    10 Shots of liquor per week    Family History  Problem Relation Age of Onset  . Atrial fibrillation Father   . Atrial fibrillation Sister   . Factor V Leiden deficiency Sister   . Atrial fibrillation Brother   . Factor V Leiden  deficiency Brother     Allergies  Allergen Reactions  . Gin [Alcohol] Other (See Comments)    Pins and needles feeling  . Adhesive [Tape] Other (See Comments)    Redness/ irritation  . Lobster [Shellfish Allergy] Other (See Comments)    Allergy test     Current Outpatient Prescriptions:  .  atorvastatin (LIPITOR) 10 MG tablet, Take 1 tablet (10 mg total) by mouth daily at 6 PM., Disp: 30 tablet, Rfl: 2 .  Cholecalciferol (D-3-5) 5000 UNITS capsule, Take 5,000 Units by mouth daily., Disp: , Rfl:  .  Coenzyme Q10 (COQ10) 200 MG CAPS, Take 300 mg by mouth daily. , Disp: , Rfl:  .  fluticasone (FLONASE) 50 MCG/ACT nasal spray, Place 1-2 sprays into both nostrils daily as needed for allergies. , Disp: , Rfl:  .  levocetirizine (XYZAL) 5 MG tablet, , Disp: , Rfl:  .  losartan-hydrochlorothiazide (HYZAAR) 100-12.5 MG tablet, , Disp: , Rfl:  .  methocarbamol (ROBAXIN) 500 MG tablet, , Disp: , Rfl:  .  metoprolol tartrate (LOPRESSOR) 25 MG tablet, Take 1 tablet (25 mg total) by mouth 3 (three) times daily., Disp: 90 tablet, Rfl: 1 .  montelukast (SINGULAIR) 10 MG tablet, Take 10 mg by mouth at bedtime., Disp: , Rfl: 3 .  Multiple Vitamins-Minerals (CENTRUM SILVER PO), Take 1 tablet by mouth daily., Disp: ,  Rfl:  .  MYRBETRIQ 25 MG TB24 tablet, , Disp: , Rfl:  .  omeprazole (PRILOSEC) 20 MG capsule, Take 20 mg by mouth daily. TAKES PRILOSEC WITH DICLOFENAC, Disp: , Rfl:  .  PROAIR RESPICLICK 123XX123 (90 Base) MCG/ACT AEPB, Inhale 1 puff into the lungs daily., Disp: , Rfl: 0 .  rivaroxaban (XARELTO) 20 MG TABS tablet, Take 1 tablet (20 mg total) by mouth daily after supper., Disp: 30 tablet, Rfl: 2 .  traMADol (ULTRAM) 50 MG tablet, Take 1 tablet (50 mg total) by mouth every 6 (six) hours as needed for moderate pain., Disp: 60 tablet, Rfl: 1 .  Turmeric 500 MG CAPS, Take by mouth., Disp: , Rfl:  .  zolpidem (AMBIEN) 5 MG tablet, Take 5 mg by mouth at bedtime as needed for sleep. , Disp: , Rfl:  .   fexofenadine (ALLEGRA) 180 MG tablet, Take 180 mg by mouth daily., Disp: , Rfl:   Vitals:   03/03/16 1003  BP: 126/89  Pulse: 63  Resp: 18  Temp: 97.5 F (36.4 C)  TempSrc: Oral  SpO2: 100%  Weight: 213 lb 11.2 oz (96.9 kg)  Height: 5\' 11"  (1.803 m)    Body mass index is 29.81 kg/m.         Review of Systems Denies chest pain, dyspnea on exertion, PND, orthopnea, hemoptysis, claudication. Does have history of degenerative joint disease, GERD, factor V Leiden deficiency, atrial fibrillation, BPH. All other systems reviewed and negative    Objective:   Physical Exam BP 126/89 (BP Location: Left Arm, Patient Position: Sitting, Cuff Size: Normal)   Pulse 63   Temp 97.5 F (36.4 C) (Oral)   Resp 18   Ht 5\' 11"  (1.803 m)   Wt 213 lb 11.2 oz (96.9 kg)   SpO2 100%   BMI 29.81 kg/m     Gen.-alert and oriented x3 in no apparent distress HEENT normal for age Lungs no rhonchi or wheezing Cardiovascular regular rhythm no murmurs carotid pulses 3+ palpable no bruits audible Abdomen soft nontender no palpable masses Musculoskeletal free of  major deformities Skin clear -no rashes Neurologic normal   Lower extremities 3+ femoral and dorsalis pedis pulses palpable bilaterally with 1+ edema bilaterally  Today I performed a bedside SonoSite ultrasound exam bilaterally which reveals enlarged great saphenous veins with gross reflux supplying these painful varicosities Extensive bulging varicosities bilaterally in the anterior lateral and medial thigh area and lateral and medial calf area with 1+ edema but no active ulceration. Mild hyperpigmentation distally.       Assessment:     #1 painful varicosities-extensive bilaterally due to gross reflux bilateral great saphenous veins with remote history of superficial thrombophlebitis but no DVT causing symptoms were here affecting patient's daily living #2 history of atrial fibrillation currently on anticoagulation #3 GERD #4  BPH #5 degenerative joint disease  #6 factor V Leiden deficiency-no events have occurred    Plan:         #1 long leg elastic compression stockings 20-30 mm gradient #2 elevate legs as much as possible #3 ibuprofen daily on a regular basis for pain #4 return in 3 months-Formal venous reflux exam bilaterally will be performed prior to next visit and we will make formal recommendations at that time if he does have laser ablation he will need to have his anticoagulation-Xarelto discontinued around the time of both procedures

## 2016-03-07 NOTE — Addendum Note (Signed)
Addended by: Lianne Cure A on: 03/07/2016 12:51 PM   Modules accepted: Orders

## 2016-04-23 DIAGNOSIS — N400 Enlarged prostate without lower urinary tract symptoms: Secondary | ICD-10-CM | POA: Diagnosis not present

## 2016-04-23 DIAGNOSIS — Z79899 Other long term (current) drug therapy: Secondary | ICD-10-CM | POA: Diagnosis not present

## 2016-04-23 DIAGNOSIS — I4891 Unspecified atrial fibrillation: Secondary | ICD-10-CM | POA: Diagnosis not present

## 2016-04-23 DIAGNOSIS — E7800 Pure hypercholesterolemia, unspecified: Secondary | ICD-10-CM | POA: Diagnosis not present

## 2016-04-23 DIAGNOSIS — J309 Allergic rhinitis, unspecified: Secondary | ICD-10-CM | POA: Diagnosis not present

## 2016-04-23 DIAGNOSIS — M545 Low back pain: Secondary | ICD-10-CM | POA: Diagnosis not present

## 2016-04-23 DIAGNOSIS — R7303 Prediabetes: Secondary | ICD-10-CM | POA: Diagnosis not present

## 2016-04-23 DIAGNOSIS — E559 Vitamin D deficiency, unspecified: Secondary | ICD-10-CM | POA: Diagnosis not present

## 2016-04-23 DIAGNOSIS — I10 Essential (primary) hypertension: Secondary | ICD-10-CM | POA: Diagnosis not present

## 2016-04-23 DIAGNOSIS — M47812 Spondylosis without myelopathy or radiculopathy, cervical region: Secondary | ICD-10-CM | POA: Diagnosis not present

## 2016-05-20 DIAGNOSIS — H6123 Impacted cerumen, bilateral: Secondary | ICD-10-CM | POA: Diagnosis not present

## 2016-05-30 ENCOUNTER — Encounter: Payer: Self-pay | Admitting: Vascular Surgery

## 2016-06-06 DIAGNOSIS — I4891 Unspecified atrial fibrillation: Secondary | ICD-10-CM | POA: Diagnosis not present

## 2016-06-06 DIAGNOSIS — I10 Essential (primary) hypertension: Secondary | ICD-10-CM | POA: Diagnosis not present

## 2016-06-06 DIAGNOSIS — I480 Paroxysmal atrial fibrillation: Secondary | ICD-10-CM | POA: Diagnosis not present

## 2016-06-06 DIAGNOSIS — F1010 Alcohol abuse, uncomplicated: Secondary | ICD-10-CM | POA: Diagnosis not present

## 2016-06-06 NOTE — H&P (Signed)
OFFICE VISIT NOTES COPIED TO EPIC FOR DOCUMENTATION  . History of Present Illness Laverda Page MD; 06/26/2016 5:01 PM) Patient words: Last OV 12/27/2015; 6 month FU for afib, pt called yesterday evening stating he went into afib the night before, he has been taking extra doses of Metoprolol, his BP was 87/67, 91/70 yesterday & pulse became more steady. he had an ultrasound on his vericouse veins.  The patient is a 71 year old male who presents for a Follow-up for Atrial fibrillation. Patient with history of atrial fibrillation when he presented to the emergency room on 08/27/2015, started on anticoagulation and discharged home, however he converted back to sinus rhythm spontaneously. He was drinking one bottle of wine per day and probably had more on a trip to the beach prior to the onset of atrial fibrillation.  He called our office to be seen, just returned back from a cruise, states that he is probably back in atrial fibrillation with palpitations and feeling markedly weak, dizzy and fatigued. No chest pain, although has noticed marked decrease in exercise tolerance over the last few days. No leg edema, no PND or orthopnea.  He has had an echocardiogram on 09/05/2015 which revealed normal LV systolic function and moderate left atrial enlargement and nuclear stress test on 09/03/2015 had revealed no evidence of ischemia with normal LVEF.   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 Total cholesterol 148, triglycerides 157, HDL 45, LDL 72. TSH normal at 4.235. HB 14.4/HCT 43.4, platelets 278. A1c 5.9% Shortness of breath on exertion (R06.02) Snoring (R06.83) Chronic neck pain (M54.2) New onset atrial fibrillation (I48.91) [08/27/2015]:  CHA2DS2-VASCScore: Risk Score 2, Yearly risk of stroke 2.2%.  Allergies Anderson Malta Sergeant; 06/26/2016 9:05 AM) No Known Allergies [12/27/2015]:  Family History Anderson Malta Sergeant; 06/26/2016 9:05 AM) Mother Deceased. at age 26, dementia, hypercholesteremia, varicose veins, no known heart conditions Father Deceased. at age 11, dementia, afib, macular degeneration, kidney issues Sister 2 younger, second oldest has afib, both have varicose veins Brother 1 younger, afib  Social History Anderson Malta Sergeant; 06/26/16 9:05 AM) Current tobacco use Former smoker. quit in the 80's, smoked for about 20 years Alcohol Use Moderate alcohol use. wine everyday Marital status Married. Living Situation Lives with spouse. Number of Children 2.  Past Surgical History Anderson Malta Sergeant; 06-26-16 9:05 AM) Cataract Extraction-Bilateral [2012]: Prostate Surgery [2012]:  Medication History Anderson Malta Sergeant; 26-Jun-2016 9:13 AM) Metoprolol Tartrate (25MG Tablet, 1 (one) Tablet Oral two times daily, Taken starting 04/14/2016) Active. TraMADol HCl (50MG Tablet, 1 Tablet Oral two times daily, as needed, Taken starting 02/24/2016) Active. Losartan Potassium-HCTZ (100-12.5MG Tablet, 1 (one) Tablet Oral daily, Taken starting 02/20/2016) Active. Xarelto (20MG Tablet, 1 (one) Tablet Oral every evening after dinner, Taken starting 01/06/2016) Active. Atorvastatin Calcium (10MG Tablet, 1 Tablet Tablet Oral daily, Taken starting 10/08/2015) Active. Centrum Silver Adult 50+ (1 Oral daily) Active. CoQ10 (100MG Capsule, 1 Oral daily) Active. Fluticasone Propionate (50MCG/ACT Suspension, Nasal daily) Active. Montelukast Sodium (10MG Tablet, 1 Oral daily) Active. ProAir RespiClick (568 (90 Base)MCG/ACT Aero Pow Br Act, Inhalation as needed) Active. Vitamin D3 (5000UNIT Capsule, 1 Oral daily) Active. Tylenol (1036m Oral as needed) Specific strength unknown - Active. Zolpidem Tartrate (5MG Tablet,  1 Oral as needed) Active. Omeprazole (1 Oral daily) Specific strength unknown - Active. Methocarbamol (500MG Tablet, 1 Oral as needed) Active. Fexofenadine HCl (  180MG Tablet, 1 Oral daily) Active. Turmeric Curcumin (500MG Capsule, 1 in am Oral 2 in pm) Active. Medications Reconciled (verbally with pt with list; fill hx verified)  Diagnostic Studies History Anderson Malta Sergeant; 06/06/2016 9:05 AM) Echocardiogram [09/05/2015]: Left ventricle cavity is normal in size. Mild concentric hypertrophy of the left ventricle. Normal global wall motion. Normal diastolic filling pattern. Calculated EF 55%. Left atrial cavity is moderately dilated at 4.5 cm. Trace mitral regurgitation. Mild tricuspid regurgitation. No evidence of pulmonary hypertension. MRI, Spine [10/2015]: Nuclear stress test [09/03/2015]: 1. Resting EKG demonstrated normal sinus rhythm, leftward axis, right bundle branch block. Low-voltage complexes. Stress EKG was nondiagnostic for ischemia as this is a pharmacologic stress test. Patient attempt treadmill stress testing, however unable to complete stress test due to marked exercise intolerance. 2. The perfusion imaging study demonstrates mild gut uptake artifact in the inferior wall without any demonstrable ischemia or scar. Left ventricular systolic function calculated by QGS was 60%. This is a low risk study. Chest X-ray [04/2015]: Normal. MRI, Cervical Spine [2017]:     Review of Systems Laverda Page, MD; 06/06/2016 5:1 PM) General Not Present- Anorexia, Fatigue and Fever. Respiratory Present- Decreased Exercise Tolerance and Difficulty Breathing on Exertion. Not Present- Cough and Dyspnea. Cardiovascular Not Present- Chest Pain, Claudications, Edema, Orthopnea, Palpitations and Paroxysmal Nocturnal Dyspnea. Gastrointestinal Not Present- Black, Tarry Stool, Change in Bowel Habits and Nausea. Neurological Not Present- Focal Neurological Symptoms and  Syncope. Endocrine Not Present- Cold Intolerance, Excessive Sweating, Heat Intolerance and Thyroid Problems. Hematology Not Present- Anemia, Easy Bruising, Petechiae and Prolonged Bleeding.  Vitals Laverda Page MD; 06/06/2016 5:01 PM) 06/06/2016 9:06 AM Weight: 215.56 lb Height: 71in Body Surface Area: 2.18 m Body Mass Index: 30.06 kg/m  Pulse: 71 (Irregular)  P.OX: 98% (Room air) BP: 118/78 (Sitting, Left Arm, Standard)      Physical Exam Laverda Page MD; 06/06/2016 5:01 PM)  General Mental Status-Alert. General Appearance-Cooperative and Appears stated age. Build & Nutrition-Well built and Mildly obese.  Head and Neck Thyroid Gland Characteristics - normal size and consistency and no palpable nodules.  Chest and Lung Exam Chest and lung exam reveals -quiet, even and easy respiratory effort with no use of accessory muscles, non-tender and on auscultation, normal breath sounds, no adventitious sounds.  Cardiovascular Cardiovascular examination reveals -carotid auscultation reveals no bruits, abdominal aorta auscultation reveals no bruits and no prominent pulsation, femoral artery auscultation bilaterally reveals normal pulses, no bruits, no thrills, normal pedal pulses bilaterally and no digital clubbing, cyanosis, edema, increased warmth or tenderness. Auscultation Rhythm - Irregular. Heart Sounds - S1 is variable and S2 WNL. Murmurs & Other Heart Sounds - Auscultation of the heart reveals - No Murmurs.  Abdomen Palpation/Percussion Normal exam - Non Tender and No hepatosplenomegaly.  Neurologic Neurologic evaluation reveals -alert and oriented x 3 with no impairment of recent or remote memory. Motor-Grossly intact without any focal deficits.  Musculoskeletal Global Assessment Left Lower Extremity - no deformities, masses or tenderness, no known fractures. Right Lower Extremity - no deformities, masses or tenderness, no known  fractures.    Assessment & Plan Laverda Page MD; 06/06/2016 10:27 AM)  Paroxysmal atrial fibrillation (I48.0) Story: CHA2DS2-VASCScore: Risk Score 2, Yearly risk of stroke 2.2%. Impression: EKG 06/06/2016: Atrial fibrillation with rapid ventricular response at the rate of 100 bpm, normal axis, right bundle branch block. Normal QT interval. No evidence of ischemia.  EKG 09/12/2015: Sinus bradycardia at the rate of 55 bpm, indeterminate axis. Right bundle branch block.  Normal QT interval, no evidence of ischemia.  08/27/2015 [facility]): EKG: Atrial fibrillation, with RVR, normal axis, right bundle branch block. Nonspecific ST-T abnormality.  Current Plans Started Flecainide Acetate 100MG, 1 (one) Tablet two times daily, #60, 06/06/2016, Ref. x1. Complete electrocardiogram (93000) Future Plans 04/26/2393: METABOLIC PANEL, BASIC (32023) - one time 06/19/2016: CBC & PLATELETS (AUTO) (34356) - one time  Labwork Labs 06/06/2016: Serum glucose 106 minute grams, nonfasting.  BUN 25, creatinine 1.22, eGFR 60 mL.  It has some 5.0. Labs 08/27/2015: Potassium 4.0, BUN 20, serum creatinine 1.17. Total cholesterol 148, triglycerides 157, HDL 45, LDL 72. TSH normal at 4.235. HB 14.4/HCT 43.4, platelets 278. A1c 5.9%   Essential hypertension (I10)  Current Plans Mechanism of underlying disease process and action of medications discussed with the patient. I discussed primary/secondary prevention and also dietary counceling was done. He is back in atrial fibrillation, that occurred about 30 hours ago, he is extremely symptomatic with marked fatigue and dyspnea there is no clinical evidence of heart failure. I have started him on flecainide 100 mg p.o. b.i.d. I'll set him up for direct current cardioversion. I'll see him back after the cardioversion.  Schedule for Direct current cardioversion. I have discussed regarding risks benefits rate control vs rhythm control with the patient. Patient  understands cardiac arrest and need for CPR, aspiration pneumonia, but not limited to these. Patient is willing. It will be scheduled for in the next available slot.  CC: Viviana Simpler, MD

## 2016-06-10 ENCOUNTER — Ambulatory Visit (HOSPITAL_COMMUNITY)
Admission: RE | Admit: 2016-06-10 | Discharge: 2016-06-10 | Disposition: A | Discharge status: Home / Self Care | Payer: PPO | Source: Ambulatory Visit | Attending: Vascular Surgery | Admitting: Vascular Surgery

## 2016-06-10 ENCOUNTER — Encounter: Payer: Self-pay | Admitting: Vascular Surgery

## 2016-06-10 ENCOUNTER — Ambulatory Visit (INDEPENDENT_AMBULATORY_CARE_PROVIDER_SITE_OTHER): Payer: PPO | Admitting: Vascular Surgery

## 2016-06-10 VITALS — BP 112/75 | HR 61 | Temp 97.1°F | Resp 18 | Ht 71.5 in | Wt 214.2 lb

## 2016-06-10 DIAGNOSIS — I83893 Varicose veins of bilateral lower extremities with other complications: Secondary | ICD-10-CM

## 2016-06-10 NOTE — Progress Notes (Signed)
Subjective:     Patient ID: Christian [name], male   DOB: October 13, 1945, 71 y.o.   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 Patient has a history of factor V Leiden deficiency and takesXarelto for history of atrial fibrillation. He has had no embolic events. He has had recurrent A. fib on a few occasions but currently does not feel that he is in atrial fibrillation. He is scheduled to see the cardiologist Dr.Gangi later today. He continues to have aching throbbing and burning discomfort in both legs in the thigh and calf areas more so on the right than the left with progressive swelling in both ankles. He's had no history of ulceration but has had history of superficial thrombophlebitis many years ago in both legs. His symptoms are affecting his daily living due to increasing pain and swelling.He has tried long leg elastic compression stockings 20-30 millimeter gradient as well as elevation and ibuprofen with no success  Past Medical History:  Diagnosis Date  . Arthritis   . BPH (benign prostatic hypertrophy)   . Congenital posterior urethral valve   . DDD (degenerative disc disease), cervical   . DDD (degenerative disc disease), lumbar   . Factor V deficiency ([place])   . Family history of atrial fibrillation    His brother, sister have A. fib, suspects his father also had A. fib  . Frequency of urination   . H/O bundle branch block   . History of gastroesophageal reflux (GERD)   . Hypertension   . Nocturia   . Shortness of breath dyspnea     Social History  Substance Use Topics  . Smoking status: Former Smoker    Packs/day: 1.00    Years: 20.00    Types: Cigarettes    Quit date: 02/28/1995  . Smokeless tobacco: Never Used  . Alcohol use 6.0 oz/week    10 Shots of liquor per week    Family History  Problem Relation Age of Onset  . Atrial fibrillation Father   . Atrial fibrillation Sister   .  Factor V Leiden deficiency Sister   . Atrial fibrillation Brother   . Factor V Leiden deficiency Brother     Allergies  Allergen Reactions  . Gin [Alcohol] Other (See Comments)    Pins and needles feeling  . Adhesive [Tape] Other (See Comments)    Redness/ irritation  . Lobster [Shellfish Allergy] Other (See Comments)    Allergy test     Current Outpatient Prescriptions:  .  atorvastatin (LIPITOR) 10 MG tablet, Take 1 tablet (10 mg total) by mouth daily at 6 PM., Disp: 30 tablet, Rfl: 2 .  Cholecalciferol (D-3-5) 5000 UNITS capsule, Take 5,000 Units by mouth daily., Disp: , Rfl:  .  Coenzyme Q10 (COQ10) 200 MG CAPS, Take 300 mg by mouth daily. , Disp: , Rfl:  .  fexofenadine (ALLEGRA) 180 MG tablet, Take 180 mg by mouth daily., Disp: , Rfl:  .  fluticasone (FLONASE) 50 MCG/ACT nasal spray, Place 1-2 sprays into both nostrils daily as needed for allergies. , Disp: , Rfl:  .  levocetirizine (XYZAL) 5 MG tablet, , Disp: , Rfl:  .  losartan-hydrochlorothiazide (HYZAAR) 100-12.5 MG tablet, , Disp: , Rfl:  .  methocarbamol (ROBAXIN) 500 MG tablet, every 8 (eight) hours as needed. , Disp: , Rfl:  .  metoprolol tartrate (LOPRESSOR) 25 MG tablet, Take 1 tablet (25 mg total) by mouth 3 (three)  times daily., Disp: 90 tablet, Rfl: 1 .  montelukast (SINGULAIR) 10 MG tablet, Take 10 mg by mouth at bedtime., Disp: , Rfl: 3 .  Multiple Vitamins-Minerals (CENTRUM SILVER PO), Take 1 tablet by mouth daily., Disp: , Rfl:  .  omeprazole (PRILOSEC) 20 MG capsule, Take 20 mg by mouth daily. TAKES PRILOSEC WITH DICLOFENAC, Disp: , Rfl:  .  PROAIR RESPICLICK 426 (90 Base) MCG/ACT AEPB, Inhale 1 puff into the lungs daily., Disp: , Rfl: 0 .  rivaroxaban (XARELTO) 20 MG TABS tablet, Take 1 tablet (20 mg total) by mouth daily after supper., Disp: 30 tablet, Rfl: 2 .  traMADol (ULTRAM) 50 MG tablet, Take 1 tablet (50 mg total) by mouth every 6 (six) hours as needed for moderate pain., Disp: 60 tablet, Rfl: 1 .   Turmeric 500 MG CAPS, Take by mouth., Disp: , Rfl:  .  zolpidem (AMBIEN) 5 MG tablet, Take 5 mg by mouth at bedtime as needed for sleep. , Disp: , Rfl:  .  flecainide (TAMBOCOR) 100 MG tablet, Take 100 mg by mouth 2 (two) times daily., Disp: , Rfl: 1 .  MYRBETRIQ 25 MG TB24 tablet, , Disp: , Rfl:   Vitals:   06/10/16 1459  BP: 112/75  Pulse: 61  Resp: 18  Temp: 97.1 F (36.2 C)  TempSrc: Oral  SpO2: 97%  Weight: 214 lb 3.2 oz (97.2 kg)  Height: 5' 11.5" (1.816 m)    Body mass index is 29.46 kg/m.         Review of Systems Denies chest pain, dyspnea on exertion, PND, orthopnea, hemoptysis, claudication. See history of present illness    Objective:   Physical Exam BP 112/75 (BP Location: Left Arm, Patient Position: Standing, Cuff Size: Normal)   Pulse 61   Temp 97.1 F (36.2 C) (Oral)   Resp 18   Ht 5' 11.5" (1.816 m)   Wt 214 lb 3.2 oz (97.2 kg)   SpO2 97%   BMI 29.46 kg/m   Gen. well-developed well-nourished male no apparent distress alert and oriented 3 Lungs no rhonchi or wheezing Cardiovascular regular rhythm no murmurs-appears to be in normal sinus rhythm Extensive bulging varicosities bilateral lower extremities with the right leg being medial thigh and calf and posterior calf and the left leg being anterior thigh medial calf. 1+ edema bilaterally. No active ulceration noted. 2+ dorsalis pedis pulse palpable bilaterally.  Today I ordered a venous reflux exam bilaterally which I reviewed and interpreted. 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 Symptoms have not been improved by long leg elastic compression stockings 20-30 millimeter gradient, elevation,  and ibuprofen and are affecting his daily living--CEAP 3 #2 history of intermittent atrial fibrillation--on Xeralto #3 factor V Leiden deficiency    Plan:     We will proceed with precertification to perform #1 laser ablation right great saphenous vein followed by #2 laser ablation left great saphenous vein Patient will then return in 3 months to be evaluated for stab phlebectomy of multiple painful varicosities bilaterally-greater than 20 We will get in touch with Dr.Gangi regarding discontinuation of the anticoagulation and whether or not the patient needs bridging with Lovenox

## 2016-06-11 ENCOUNTER — Encounter (HOSPITAL_COMMUNITY): Admission: RE | Payer: Self-pay | Source: Ambulatory Visit

## 2016-06-11 ENCOUNTER — Ambulatory Visit (HOSPITAL_COMMUNITY): Admission: RE | Admit: 2016-06-11 | Payer: PPO | Source: Ambulatory Visit | Admitting: Cardiology

## 2016-06-11 SURGERY — CARDIOVERSION
Anesthesia: General

## 2016-06-24 ENCOUNTER — Other Ambulatory Visit: Payer: Self-pay | Admitting: *Deleted

## 2016-06-24 DIAGNOSIS — I83893 Varicose veins of bilateral lower extremities with other complications: Secondary | ICD-10-CM

## 2016-06-25 ENCOUNTER — Encounter: Payer: Self-pay | Admitting: Vascular Surgery

## 2016-06-30 ENCOUNTER — Ambulatory Visit (INDEPENDENT_AMBULATORY_CARE_PROVIDER_SITE_OTHER): Payer: PPO | Admitting: Vascular Surgery

## 2016-06-30 ENCOUNTER — Encounter: Payer: Self-pay | Admitting: Vascular Surgery

## 2016-06-30 VITALS — BP 124/86 | HR 52 | Temp 97.3°F | Resp 16 | Ht 71.0 in | Wt 214.0 lb

## 2016-06-30 DIAGNOSIS — I868 Varicose veins of other specified sites: Secondary | ICD-10-CM

## 2016-06-30 DIAGNOSIS — I83891 Varicose veins of right lower extremities with other complications: Secondary | ICD-10-CM | POA: Diagnosis not present

## 2016-06-30 HISTORY — PX: ENDOVENOUS ABLATION SAPHENOUS VEIN W/ LASER: SUR449

## 2016-06-30 NOTE — Progress Notes (Signed)
Laser Ablation Procedure    Date: 06/30/2016   Christian [name] DOB:Sep 25, 1945  Consent signed: Yes    Surgeon:  Dr. Nelda Severe. Kellie Simmering  Procedure: Laser Ablation: right Greater Saphenous Vein  BP 124/86 (BP Location: Left Arm, Patient Position: Sitting, Cuff Size: Normal)   Pulse (!) 52   Temp 97.3 F (36.3 C) (Oral)   Resp 16   Ht 5\' 11"  (1.803 m)   Wt 214 lb (97.1 kg)   SpO2 100%   BMI 29.85 kg/m   Tumescent Anesthesia: 450 cc 0.9% NaCl with 50 cc Lidocaine HCL with 1% Epi and 15 cc 8.4% NaHCO3  Local Anesthesia: 3 cc Lidocaine HCL and NaHCO3 (ratio 2:1)  Pulsed Mode: 15 watts, 564ms delay, 1.0 duration  Total Energy: 2403 Joules             Total Pulses: 161               Total Time: 2:40     Patient tolerated procedure well  Notes: Last dose of Xarelto June 15.  To restart Xarelto June 21.  Patient aware. Approved by Dr. Jenkins Rouge   Description of Procedure:  After marking the course of the secondary varicosities, the patient was placed on the operating table in the supine position, and the right leg was prepped and draped in sterile fashion.   Local anesthetic was administered and under ultrasound guidance the saphenous vein was accessed with a micro needle and guide wire; then the mirco puncture sheath was placed.  A guide wire was inserted saphenofemoral junction , followed by a 5 french sheath.  The position of the sheath and then the laser fiber below the junction was confirmed using the ultrasound.  Tumescent anesthesia was administered along the course of the saphenous vein using ultrasound guidance. The patient was placed in Trendelenburg position and protective laser glasses were placed on patient and staff, and the laser was fired at 15 watts continuous mode advancing 1-39mm/second for a total of 2403 joules.       Steri strip was to IV insertion site and ABD pads and thigh high compression stockings were applied.  Ace wrap bandages were applied  and at the top of the  saphenofemoral junction. Blood loss was less than 15 cc.  The patient ambulated out of the operating room having tolerated the procedure well.

## 2016-06-30 NOTE — Progress Notes (Signed)
Subjective:     Patient ID: Christian [name], male   DOB: [birth year], 71 y.o.   MRN: [id number]  HPI This 71 year old male had laser ablation of the right great saphenous vein from the proximal calf to near the saphenofemoral junction performed under local tumescent anesthesia. A total of 2403 J of energy was utilized. He tolerated the procedure well.  Review of Systems     Objective:   Physical Exam BP 124/86 (BP Location: Left Arm, Patient Position: Sitting, Cuff Size: Normal)   Pulse (!) 52   Temp 97.3 F (36.3 C) (Oral)   Resp 16   Ht 5\' 11"  (1.803 m)   Wt 214 lb (97.1 kg)   SpO2 100%   BMI 29.85 kg/m        Assessment:     Well-tolerated laser ablation right great saphenous vein performed under local tumescent anesthesia    Plan:     Return in 1 week for venous duplex exam to confirm closure right great saphenous vein Resume Christian [name] on June 21 We'll have similar procedure on contralateral left leg in the near future

## 2016-07-01 ENCOUNTER — Encounter: Payer: Self-pay | Admitting: Vascular Surgery

## 2016-07-02 ENCOUNTER — Telehealth: Payer: Self-pay | Admitting: *Deleted

## 2016-07-02 NOTE — Telephone Encounter (Signed)
Pt called because there is some bleeding at the laser insertion site.Steristrip is still intact. Told him to hold pressure for 5 mins. Once the bleeding has stopped to apply a bandage and then put the thigh high stocking back on. Pt also asked if bruising at the top of the thigh was normal and I said it was. Follow prn.

## 2016-07-03 ENCOUNTER — Encounter: Payer: Self-pay | Admitting: Vascular Surgery

## 2016-07-07 ENCOUNTER — Telehealth: Payer: Self-pay | Admitting: *Deleted

## 2016-07-07 DIAGNOSIS — I480 Paroxysmal atrial fibrillation: Secondary | ICD-10-CM | POA: Diagnosis not present

## 2016-07-07 NOTE — Telephone Encounter (Signed)
Returning Mr. Christian [name] telephone voice message regarding right leg pain.  Mr. Christian [name] is s/p endovenous laser ablation right greater saphenous vein 06-30-2016 by Tinnie Gens MD. Mr. Christian [name] states the pain he is experiencing is located along the right upper thigh extending down to calf (area treated by laser ablation). Explained to Mr. Christian [name] that inflammatory pain along the course of the area treated by laser ablation is very common. Mr. Christian [name] is unable to take Ibuprofen for inflammatory pain as he is on Xarelto.  Recommended he use ice compress to right inner thigh and take Tylenol Extra strength prn pain.  Mr. Christian [name] is scheduled for post LA venous duplex and follow up with Dr. Kellie Simmering (tomorrow)  07-08-2016.

## 2016-07-08 ENCOUNTER — Ambulatory Visit (HOSPITAL_COMMUNITY)
Admission: RE | Admit: 2016-07-08 | Discharge: 2016-07-08 | Disposition: A | Discharge status: Home / Self Care | Payer: PPO | Source: Ambulatory Visit | Attending: Vascular Surgery | Admitting: Vascular Surgery

## 2016-07-08 ENCOUNTER — Ambulatory Visit (INDEPENDENT_AMBULATORY_CARE_PROVIDER_SITE_OTHER): Payer: PPO | Admitting: Vascular Surgery

## 2016-07-08 ENCOUNTER — Encounter: Payer: Self-pay | Admitting: Vascular Surgery

## 2016-07-08 VITALS — BP 126/87 | HR 57 | Temp 97.7°F | Resp 20 | Ht 71.0 in | Wt 213.9 lb

## 2016-07-08 DIAGNOSIS — I83893 Varicose veins of bilateral lower extremities with other complications: Secondary | ICD-10-CM | POA: Diagnosis not present

## 2016-07-08 NOTE — Progress Notes (Signed)
Subjective:     Patient ID: Christian [name], male   DOB: Jun 30, 1945, 71 y.o.   MRN: [id number]  HPI This 71 year old male returns 1 week post-laser ablation right great saphenous vein for gross reflux with painful varicosities. He was only able to take his ibuprofen for 2 days because he had to resume his anticoagulation-Xeralto-and he began having some inflammatory discomfort in the proximal and medial thigh area due to the laser ablation. He did try ice and Tylenol. He's had no change in distal edema. He has had no chest pain, dyspnea on exertion, PND, orthopnea, hemoptysis. His symptoms seem to be improving.  Past Medical History:  Diagnosis Date  . Arthritis   . BPH (benign prostatic hypertrophy)   . Congenital posterior urethral valve   . DDD (degenerative disc disease), cervical   . DDD (degenerative disc disease), lumbar   . Factor V deficiency ([place])   . Family history of atrial fibrillation    His brother, sister have A. fib, suspects his father also had A. fib  . Frequency of urination   . H/O bundle branch block   . History of gastroesophageal reflux (GERD)   . Hypertension   . Nocturia   . Shortness of breath dyspnea     Social History  Substance Use Topics  . Smoking status: Former Smoker    Packs/day: 1.00    Years: 20.00    Types: Cigarettes    Quit date: 02/28/1995  . Smokeless tobacco: Never Used  . Alcohol use 6.0 oz/week    10 Shots of liquor per week    Family History  Problem Relation Age of Onset  . Atrial fibrillation Father   . Atrial fibrillation Sister   . Factor V Leiden deficiency Sister   . Atrial fibrillation Brother   . Factor V Leiden deficiency Brother     Allergies  Allergen Reactions  . Gin [Alcohol] Other (See Comments)    Pins and needles feeling  . Adhesive [Tape] Other (See Comments)    Redness/ irritation  . Lobster [Shellfish Allergy] Other (See Comments)    Allergy test     Current Outpatient Prescriptions:  .  atorvastatin  (LIPITOR) 10 MG tablet, Take 1 tablet (10 mg total) by mouth daily at 6 PM., Disp: 30 tablet, Rfl: 2 .  Cholecalciferol (D-3-5) 5000 UNITS capsule, Take 5,000 Units by mouth daily., Disp: , Rfl:  .  Coenzyme Q10 (COQ10) 200 MG CAPS, Take 300 mg by mouth daily. , Disp: , Rfl:  .  fexofenadine (ALLEGRA) 180 MG tablet, Take 180 mg by mouth daily., Disp: , Rfl:  .  flecainide (TAMBOCOR) 100 MG tablet, Take 100 mg by mouth 2 (two) times daily., Disp: , Rfl: 1 .  fluticasone (FLONASE) 50 MCG/ACT nasal spray, Place 1-2 sprays into both nostrils daily as needed for allergies. , Disp: , Rfl:  .  levocetirizine (XYZAL) 5 MG tablet, , Disp: , Rfl:  .  losartan-hydrochlorothiazide (HYZAAR) 100-12.5 MG tablet, , Disp: , Rfl:  .  methocarbamol (ROBAXIN) 500 MG tablet, every 8 (eight) hours as needed. , Disp: , Rfl:  .  metoprolol tartrate (LOPRESSOR) 25 MG tablet, Take 1 tablet (25 mg total) by mouth 3 (three) times daily., Disp: 90 tablet, Rfl: 1 .  montelukast (SINGULAIR) 10 MG tablet, Take 10 mg by mouth at bedtime., Disp: , Rfl: 3 .  Multiple Vitamins-Minerals (CENTRUM SILVER PO), Take 1 tablet by mouth daily., Disp: , Rfl:  .  MYRBETRIQ 25 MG  TB24 tablet, , Disp: , Rfl:  .  omeprazole (PRILOSEC) 20 MG capsule, Take 20 mg by mouth daily. TAKES PRILOSEC WITH DICLOFENAC, Disp: , Rfl:  .  PROAIR RESPICLICK 166 (90 Base) MCG/ACT AEPB, Inhale 1 puff into the lungs daily., Disp: , Rfl: 0 .  rivaroxaban (XARELTO) 20 MG TABS tablet, Take 1 tablet (20 mg total) by mouth daily after supper., Disp: 30 tablet, Rfl: 2 .  traMADol (ULTRAM) 50 MG tablet, Take 1 tablet (50 mg total) by mouth every 6 (six) hours as needed for moderate pain., Disp: 60 tablet, Rfl: 1 .  Turmeric 500 MG CAPS, Take by mouth., Disp: , Rfl:  .  zolpidem (AMBIEN) 5 MG tablet, Take 5 mg by mouth at bedtime as needed for sleep. , Disp: , Rfl:   Vitals:   07/08/16 1032  BP: 126/87  Pulse: (!) 57  Resp: 20  Temp: 97.7 F (36.5 C)  TempSrc:  Oral  SpO2: 98%  Weight: 213 lb 14.4 oz (97 kg)  Height: 5\' 11"  (1.803 m)    Body mass index is 29.83 kg/m.         Review of Systems See history of present illness    Objective:   Physical Exam BP 126/87 (BP Location: Left Arm, Patient Position: Sitting, Cuff Size: Normal)   Pulse (!) 57   Temp 97.7 F (36.5 C) (Oral)   Resp 20   Ht 5\' 11"  (1.803 m)   Wt 213 lb 14.4 oz (97 kg)   SpO2 98%   BMI 29.83 kg/m   Gen. well-developed well-nourished male no apparent distress alert and oriented 3 Lungs no rhonchi or wheezing Right leg with erythema in the mid to proximal thigh overlying the great saphenous vein. Mild to moderate tenderness to deep palpation. No distal edema noted. Bulging varicosities are quite extensive in the thigh calf anteriorly and posteriorly. 3+ dorsalis pedis pulse palpable.  Today I ordered a venous duplex exam the right leg which I reviewed and interpreted. There is no DVT. There is total closure of the right great saphenous vein up to near the saphenofemoral junction     Assessment:     Successful laser ablation right great saphenous vein for gross reflux with painful varicosities    Plan:     Plan similar procedure and contralateral left leg on Monday, July 2 Patient will discontinue hisXeralto after his Friday dose and then resume on the Thursday following the procedure

## 2016-07-09 DIAGNOSIS — I10 Essential (primary) hypertension: Secondary | ICD-10-CM | POA: Diagnosis not present

## 2016-07-09 DIAGNOSIS — F1010 Alcohol abuse, uncomplicated: Secondary | ICD-10-CM | POA: Diagnosis not present

## 2016-07-09 DIAGNOSIS — R195 Other fecal abnormalities: Secondary | ICD-10-CM | POA: Diagnosis not present

## 2016-07-09 DIAGNOSIS — I480 Paroxysmal atrial fibrillation: Secondary | ICD-10-CM | POA: Diagnosis not present

## 2016-07-10 DIAGNOSIS — K591 Functional diarrhea: Secondary | ICD-10-CM | POA: Diagnosis not present

## 2016-07-10 DIAGNOSIS — R350 Frequency of micturition: Secondary | ICD-10-CM | POA: Diagnosis not present

## 2016-07-14 ENCOUNTER — Ambulatory Visit (INDEPENDENT_AMBULATORY_CARE_PROVIDER_SITE_OTHER): Payer: PPO | Admitting: Vascular Surgery

## 2016-07-14 ENCOUNTER — Encounter: Payer: Self-pay | Admitting: Vascular Surgery

## 2016-07-14 VITALS — BP 111/74 | HR 53 | Temp 97.7°F | Resp 20 | Ht 71.0 in | Wt 215.0 lb

## 2016-07-14 DIAGNOSIS — I83892 Varicose veins of left lower extremities with other complications: Secondary | ICD-10-CM | POA: Diagnosis not present

## 2016-07-14 HISTORY — PX: ENDOVENOUS ABLATION SAPHENOUS VEIN W/ LASER: SUR449

## 2016-07-14 NOTE — Progress Notes (Signed)
Laser Ablation Procedure    Date: 07/14/2016   Christian [name] DOB:08-13-1945  Consent signed: Yes    Surgeon:  Dr. Nelda Severe. Kellie Simmering  Procedure: Laser Ablation: left Greater Saphenous Vein  BP 111/74 (BP Location: Left Arm, Patient Position: Sitting, Cuff Size: Normal)   Pulse (!) 53   Temp 97.7 F (36.5 C) (Oral)   Resp 20   Ht 5\' 11"  (1.803 m)   Wt 215 lb (97.5 kg)   SpO2 99%   BMI 29.99 kg/m   Tumescent Anesthesia: 450 cc 0.9% NaCl with 50 cc Lidocaine HCL with 1% Epi and 15 cc 8.4% NaHCO3  Local Anesthesia: 3 cc Lidocaine HCL and NaHCO3 (ratio 2:1)  Pulsed Mode: 15 watts, 58ms delay, 1.0 duration  Total Energy:  2320 Joules            Total Pulses: 155               Total Time: 2:35    Patient tolerated procedure well  Notes: Per Christian [name], his last dose of Xarelto was 07-11-2016.    Description of Procedure:  After marking the course of the secondary varicosities, the patient was placed on the operating table in the supine position, and the left leg was prepped and draped in sterile fashion.   Local anesthetic was administered and under ultrasound guidance the saphenous vein was accessed with a micro needle and guide wire; then the mirco puncture sheath was placed.  A guide wire was inserted saphenofemoral junction , followed by a 5 french sheath.  The position of the sheath and then the laser fiber below the junction was confirmed using the ultrasound.  Tumescent anesthesia was administered along the course of the saphenous vein using ultrasound guidance. The patient was placed in Trendelenburg position and protective laser glasses were placed on patient and staff, and the laser was fired at 15 watts continuous mode advancing 1-56mm/second for a total of 2320 joules.     Steri strip was applied to the IV insertion site and ABD pads and thigh high compression stockings were applied.  Ace wrap bandages were applied at the top of the saphenofemoral junction. Blood loss was less  than 15 cc.  The patient ambulated out of the operating room having tolerated the procedure well.

## 2016-07-14 NOTE — Progress Notes (Signed)
Subjective:     Patient ID: Christian [name], male   DOB: 02-14-1945, 71 y.o.   MRN: [id number]  HPI This 71 year old male had laser ablation left great saphenous vein from the proximal calf to near the saphenofemoral junction performed under local tumescent anesthesia. A total of approximately 2300 J of energy was utilized. He tolerated the procedure well.  Review of Systems     Objective:   Physical Exam BP 111/74 (BP Location: Left Arm, Patient Position: Sitting, Cuff Size: Normal)   Pulse (!) 53   Temp 97.7 F (36.5 C) (Oral)   Resp 20   Ht 5\' 11"  (1.803 m)   Wt 215 lb (97.5 kg)   SpO2 99%   BMI 29.99 kg/m        Assessment:     Well-tolerated laser ablation left great saphenous vein performed under local tumescent anesthesia    Plan:     Return in 1 week for venous duplex exam to confirm closure left great saphenous vein Patient will then return in 3 months to be evaluated for possible stab phlebectomy of residual painful varicosities

## 2016-07-15 ENCOUNTER — Encounter: Payer: Self-pay | Admitting: Vascular Surgery

## 2016-07-17 ENCOUNTER — Telehealth: Payer: Self-pay | Admitting: *Deleted

## 2016-07-17 NOTE — Telephone Encounter (Signed)
Returning Mr. Pennings telephone voice message from 07-16-2016 (office was closed) regarding redness/irritation along left inner thigh and 2 "blisters" on left leg. Mr. Plack is s/p endovenous laser ablation L GSV on 07-14-2016 by Dr. Tinnie Gens.  Mr. Monnier states that today "2 small blisters (left leg) have healed" and redness/irritation along left inner thigh has resolved.  Mr. Westman has no further questions or concerns. He is aware of post LA duplex and VV follow up with Dr. Kellie Simmering on 07-21-2016.

## 2016-07-21 ENCOUNTER — Ambulatory Visit (HOSPITAL_COMMUNITY)
Admission: RE | Admit: 2016-07-21 | Discharge: 2016-07-21 | Disposition: A | Discharge status: Home / Self Care | Payer: PPO | Source: Ambulatory Visit | Attending: Vascular Surgery | Admitting: Vascular Surgery

## 2016-07-21 ENCOUNTER — Ambulatory Visit (INDEPENDENT_AMBULATORY_CARE_PROVIDER_SITE_OTHER): Payer: PPO | Admitting: Vascular Surgery

## 2016-07-21 ENCOUNTER — Encounter: Payer: Self-pay | Admitting: Vascular Surgery

## 2016-07-21 VITALS — BP 101/70 | HR 63 | Temp 98.0°F | Resp 18 | Ht 71.5 in | Wt 210.0 lb

## 2016-07-21 DIAGNOSIS — I83893 Varicose veins of bilateral lower extremities with other complications: Secondary | ICD-10-CM | POA: Insufficient documentation

## 2016-07-21 NOTE — Progress Notes (Signed)
Subjective:     Patient ID: Burna Cash, male   DOB: 04/16/1945, 71 y.o.   MRN: [id number]  HPI This 71 year old male returns 1 week post-laser ablation left great saphenous vein for gross reflux with painful varicosities. He has had a similar amount of discomfort in the medial thigh Z had on the contralateral right leg a few weeks ago. He's had no change in distal edema. He has worn his elastic compression stocking as instructed. He has resumed his Xeralto. He is unable to take ibuprofen because of the anticoagulation.  Past Medical History:  Diagnosis Date  . Arthritis   . BPH (benign prostatic hypertrophy)   . Congenital posterior urethral valve   . DDD (degenerative disc disease), cervical   . DDD (degenerative disc disease), lumbar   . Factor V deficiency ([place])   . Family history of atrial fibrillation    His brother, sister have A. fib, suspects his father also had A. fib  . Frequency of urination   . H/O bundle branch block   . History of gastroesophageal reflux (GERD)   . Hypertension   . Nocturia   . Shortness of breath dyspnea     Social History  Substance Use Topics  . Smoking status: Former Smoker    Packs/day: 1.00    Years: 20.00    Types: Cigarettes    Quit date: 02/28/1995  . Smokeless tobacco: Never Used  . Alcohol use 6.0 oz/week    10 Shots of liquor per week    Family History  Problem Relation Age of Onset  . Atrial fibrillation Father   . Atrial fibrillation Sister   . Factor V Leiden deficiency Sister   . Atrial fibrillation Brother   . Factor V Leiden deficiency Brother     Allergies  Allergen Reactions  . Gin [Alcohol] Other (See Comments)    Pins and needles feeling  . Adhesive [Tape] Other (See Comments)    Redness/ irritation  . Lobster [Shellfish Allergy] Other (See Comments)    Allergy test     Current Outpatient Prescriptions:  .  atorvastatin (LIPITOR) 10 MG tablet, Take 1 tablet (10 mg total) by mouth daily at 6 PM., Disp: 30  tablet, Rfl: 2 .  Cholecalciferol (D-3-5) 5000 UNITS capsule, Take 5,000 Units by mouth daily., Disp: , Rfl:  .  Coenzyme Q10 (COQ10) 200 MG CAPS, Take 300 mg by mouth daily. , Disp: , Rfl:  .  fexofenadine (ALLEGRA) 180 MG tablet, Take 180 mg by mouth daily., Disp: , Rfl:  .  flecainide (TAMBOCOR) 100 MG tablet, Take 100 mg by mouth 2 (two) times daily., Disp: , Rfl: 1 .  fluticasone (FLONASE) 50 MCG/ACT nasal spray, Place 1-2 sprays into both nostrils daily as needed for allergies. , Disp: , Rfl:  .  levocetirizine (XYZAL) 5 MG tablet, , Disp: , Rfl:  .  losartan-hydrochlorothiazide (HYZAAR) 100-12.5 MG tablet, , Disp: , Rfl:  .  methocarbamol (ROBAXIN) 500 MG tablet, every 8 (eight) hours as needed. , Disp: , Rfl:  .  metoprolol tartrate (LOPRESSOR) 25 MG tablet, Take 1 tablet (25 mg total) by mouth 3 (three) times daily., Disp: 90 tablet, Rfl: 1 .  montelukast (SINGULAIR) 10 MG tablet, Take 10 mg by mouth at bedtime., Disp: , Rfl: 3 .  Multiple Vitamins-Minerals (CENTRUM SILVER PO), Take 1 tablet by mouth daily., Disp: , Rfl:  .  MYRBETRIQ 25 MG TB24 tablet, , Disp: , Rfl:  .  omeprazole (PRILOSEC) 20 MG  capsule, Take 20 mg by mouth daily. TAKES PRILOSEC WITH DICLOFENAC, Disp: , Rfl:  .  PROAIR RESPICLICK 188 (90 Base) MCG/ACT AEPB, Inhale 1 puff into the lungs daily., Disp: , Rfl: 0 .  Probiotic Product (PROBIOTIC ADVANCED PO), Take by mouth daily., Disp: , Rfl:  .  rivaroxaban (XARELTO) 20 MG TABS tablet, Take 1 tablet (20 mg total) by mouth daily after supper., Disp: 30 tablet, Rfl: 2 .  traMADol (ULTRAM) 50 MG tablet, Take 1 tablet (50 mg total) by mouth every 6 (six) hours as needed for moderate pain., Disp: 60 tablet, Rfl: 1 .  Turmeric 500 MG CAPS, Take by mouth., Disp: , Rfl:  .  zolpidem (AMBIEN) 5 MG tablet, Take 5 mg by mouth at bedtime as needed for sleep. , Disp: , Rfl:   Vitals:   07/21/16 1356  BP: 101/70  Pulse: 63  Resp: 18  Temp: 98 F (36.7 C)  SpO2: 98%  Weight:  210 lb (95.3 kg)  Height: 5' 11.5" (1.816 m)    Body mass index is 28.88 kg/m.         Review of Systems Denies chest pain, dyspnea on exertion, PND, orthopnea, hemoptysis. Does have factor V Leiden deficiency    Objective:   Physical Exam BP 101/70 (BP Location: Left Arm, Patient Position: Sitting, Cuff Size: Normal)   Pulse 63   Temp 98 F (36.7 C)   Resp 18   Ht 5' 11.5" (1.816 m)   Wt 210 lb (95.3 kg)   SpO2 98%   BMI 28.88 kg/m   Gen. well-developed well-nourished male no apparent distress alert and oriented 3 Lungs no rhonchi or wheezing Cardiovascular regular rhythm no murmurs Left leg with mild discomfort deep palpation of great saphenous vein down to the distal thigh area. Multiple bulging varicosities in the anterior thigh lateral thigh medial calf. 3+ dorsalis pedis pulse palpable.  Stab ordered a venous duplex exam the left leg which I reviewed and interpreted. There is no DVT. There is total closure of the left great saphenous vein up to near the saphenofemoral junction.     Assessment:     Successful bilateral laser ablation great saphenous systems with multiple secondary painful varicosities and swelling bilaterally Factor V Leiden deficiency on chronic anticoagulation    Plan:     Return in 3 months to evaluate for multiple stab phlebectomy of bilateral painful varicosities to complete patient's treatment regimen

## 2016-08-19 DIAGNOSIS — R351 Nocturia: Secondary | ICD-10-CM | POA: Diagnosis not present

## 2016-08-19 DIAGNOSIS — R3912 Poor urinary stream: Secondary | ICD-10-CM | POA: Diagnosis not present

## 2016-08-19 DIAGNOSIS — N401 Enlarged prostate with lower urinary tract symptoms: Secondary | ICD-10-CM | POA: Diagnosis not present

## 2016-10-23 DIAGNOSIS — J309 Allergic rhinitis, unspecified: Secondary | ICD-10-CM | POA: Diagnosis not present

## 2016-10-23 DIAGNOSIS — Z79899 Other long term (current) drug therapy: Secondary | ICD-10-CM | POA: Diagnosis not present

## 2016-10-23 DIAGNOSIS — Z125 Encounter for screening for malignant neoplasm of prostate: Secondary | ICD-10-CM | POA: Diagnosis not present

## 2016-10-23 DIAGNOSIS — M545 Low back pain: Secondary | ICD-10-CM | POA: Diagnosis not present

## 2016-10-23 DIAGNOSIS — Z1389 Encounter for screening for other disorder: Secondary | ICD-10-CM | POA: Diagnosis not present

## 2016-10-23 DIAGNOSIS — N400 Enlarged prostate without lower urinary tract symptoms: Secondary | ICD-10-CM | POA: Diagnosis not present

## 2016-10-23 DIAGNOSIS — E7800 Pure hypercholesterolemia, unspecified: Secondary | ICD-10-CM | POA: Diagnosis not present

## 2016-10-23 DIAGNOSIS — Z0001 Encounter for general adult medical examination with abnormal findings: Secondary | ICD-10-CM | POA: Diagnosis not present

## 2016-10-23 DIAGNOSIS — M47812 Spondylosis without myelopathy or radiculopathy, cervical region: Secondary | ICD-10-CM | POA: Diagnosis not present

## 2016-10-23 DIAGNOSIS — R739 Hyperglycemia, unspecified: Secondary | ICD-10-CM | POA: Diagnosis not present

## 2016-10-23 DIAGNOSIS — E559 Vitamin D deficiency, unspecified: Secondary | ICD-10-CM | POA: Diagnosis not present

## 2016-10-23 DIAGNOSIS — I10 Essential (primary) hypertension: Secondary | ICD-10-CM | POA: Diagnosis not present

## 2016-10-23 DIAGNOSIS — I4891 Unspecified atrial fibrillation: Secondary | ICD-10-CM | POA: Diagnosis not present

## 2016-10-28 ENCOUNTER — Encounter: Payer: Self-pay | Admitting: Vascular Surgery

## 2016-10-28 ENCOUNTER — Ambulatory Visit (INDEPENDENT_AMBULATORY_CARE_PROVIDER_SITE_OTHER): Payer: PPO | Admitting: Vascular Surgery

## 2016-10-28 VITALS — BP 116/81 | HR 56 | Temp 97.3°F | Resp 18 | Ht 71.5 in | Wt 210.0 lb

## 2016-10-28 DIAGNOSIS — I83893 Varicose veins of bilateral lower extremities with other complications: Secondary | ICD-10-CM | POA: Diagnosis not present

## 2016-10-28 NOTE — Progress Notes (Signed)
Subjective:     Patient ID: Christian [name], male   DOB: Jul 02, 1945, 71 y.o.   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 3 months ago he underwent bilateral staged laser ablation of great saphenous vein procedures for painful varicosities. He returns now for evaluation for possible stab phlebectomy of the residual varicosities. He has tried long-leg elastic compression stockings 20-30 millimeter gradient as well as elevation and ibuprofen with no improvement. He does have a history of atrial fibrillation and factor V Leiden deficiency and is on chronic anticoagulation-Xarelto.  Past Medical History:  Diagnosis Date  . Arthritis   . BPH (benign prostatic hypertrophy)   . Congenital posterior urethral valve   . DDD (degenerative disc disease), cervical   . DDD (degenerative disc disease), lumbar   . Factor V deficiency ([place])   . Family history of atrial fibrillation    His brother, sister have A. fib, suspects his father also had A. fib  . Frequency of urination   . H/O bundle branch block   . History of gastroesophageal reflux (GERD)   . Hypertension   . Nocturia   . Shortness of breath dyspnea     Social History  Substance Use Topics  . Smoking status: Former Smoker    Packs/day: 1.00    Years: 20.00    Types: Cigarettes    Quit date: 02/28/1995  . Smokeless tobacco: Never Used  . Alcohol use 6.0 oz/week    10 Shots of liquor per week    Family History  Problem Relation Age of Onset  . Atrial fibrillation Father   . Atrial fibrillation Sister   . Factor V Leiden deficiency Sister   . Atrial fibrillation Brother   . Factor V Leiden deficiency Brother     Allergies  Allergen Reactions  . Gin [Alcohol] Other (See Comments)    Pins and needles feeling  . Adhesive [Tape] Other (See Comments)    Redness/ irritation  . Lobster [Shellfish Allergy] Other (See Comments)    Allergy test      Current Outpatient Prescriptions:  .  atorvastatin (LIPITOR) 10 MG tablet, Take 1 tablet (10 mg total) by mouth daily at 6 PM., Disp: 30 tablet, Rfl: 2 .  Cholecalciferol (D-3-5) 5000 UNITS capsule, Take 5,000 Units by mouth daily., Disp: , Rfl:  .  Coenzyme Q10 (COQ10) 200 MG CAPS, Take 300 mg by mouth daily. , Disp: , Rfl:  .  fexofenadine (ALLEGRA) 180 MG tablet, Take 180 mg by mouth daily., Disp: , Rfl:  .  flecainide (TAMBOCOR) 100 MG tablet, Take 100 mg by mouth 2 (two) times daily., Disp: , Rfl: 1 .  fluticasone (FLONASE) 50 MCG/ACT nasal spray, Place 1-2 sprays into both nostrils daily as needed for allergies. , Disp: , Rfl:  .  levocetirizine (XYZAL) 5 MG tablet, , Disp: , Rfl:  .  losartan-hydrochlorothiazide (HYZAAR) 100-12.5 MG tablet, , Disp: , Rfl:  .  metoprolol tartrate (LOPRESSOR) 25 MG tablet, Take 1 tablet (25 mg total) by mouth 3 (three) times daily., Disp: 90 tablet, Rfl: 1 .  montelukast (SINGULAIR) 10 MG tablet, Take 10 mg by mouth at bedtime., Disp: , Rfl: 3 .  Multiple Vitamins-Minerals (CENTRUM SILVER PO), Take 1 tablet by mouth daily., Disp: , Rfl:  .  omeprazole (PRILOSEC) 20 MG capsule, Take 20 mg by mouth daily. TAKES PRILOSEC WITH DICLOFENAC, Disp: , Rfl:  .  [place] 578 (  90 Base) MCG/ACT AEPB, Inhale 1 puff into the lungs daily., Disp: , Rfl: 0 .  Probiotic Product (PROBIOTIC ADVANCED PO), Take by mouth daily., Disp: , Rfl:  .  rivaroxaban (XARELTO) 20 MG TABS tablet, Take 1 tablet (20 mg total) by mouth daily after supper., Disp: 30 tablet, Rfl: 2 .  traMADol (ULTRAM) 50 MG tablet, Take 1 tablet (50 mg total) by mouth every 6 (six) hours as needed for moderate pain., Disp: 60 tablet, Rfl: 1 .  zolpidem (AMBIEN) 5 MG tablet, Take 5 mg by mouth at bedtime as needed for sleep. , Disp: , Rfl:  .  methocarbamol (ROBAXIN) 500 MG tablet, every 8 (eight) hours as needed. , Disp: , Rfl:  .  MYRBETRIQ 25 MG TB24 tablet, , Disp: , Rfl:  .  Turmeric 500 MG  CAPS, Take by mouth., Disp: , Rfl:   Vitals:   10/28/16 1417  BP: 116/81  Pulse: (!) 56  Resp: 18  Temp: (!) 97.3 F (36.3 C)  TempSrc: Oral  SpO2: 100%  Weight: 210 lb (95.3 kg)  Height: 5' 11.5" (1.816 m)    Body mass index is 28.88 kg/m.         Review of Systems Denies chest pain, dyspnea on exertion, PND, orthopnea, hemoptysis, claudication    Objective:   Physical Exam BP 116/81 (BP Location: Left Arm, Patient Position: Sitting, Cuff Size: Normal)   Pulse (!) 56   Temp (!) 97.3 F (36.3 C) (Oral)   Resp 18   Ht 5' 11.5" (1.816 m)   Wt 210 lb (95.3 kg)   SpO2 100%   BMI 28.88 kg/m   Gen. well-developed well-nourished male no apparent distress alert and oriented 3 Lungs no rhonchi or wheezing Both legs with extensive bulging varicosities beginning in the anterior thigh extending medially and posteriorly and to the medial calf areas. No hyperpigmentation or ulceration noted distally.     Assessment:     #1 status post bilateral laser ablation great saphenous vein for painful varicosities with residual painful varicosities following successful procedure-affecting patient's daily living and resistant to conservative measures including long-leg elastic compression stockings 20-30 millimeter gradient, elevation, and ibuprofen    Plan:     Patient needs #1 multiple stab phlebectomy painful varicosities left leg-greater than 20 followed by #2 stab phlebectomy of painful varicosities right leg greater than 20 We will proceed with precertification to perform this near future to complete his treatment regimen

## 2016-10-31 DIAGNOSIS — H04123 Dry eye syndrome of bilateral lacrimal glands: Secondary | ICD-10-CM | POA: Diagnosis not present

## 2016-10-31 DIAGNOSIS — D3131 Benign neoplasm of right choroid: Secondary | ICD-10-CM | POA: Diagnosis not present

## 2016-10-31 DIAGNOSIS — H02831 Dermatochalasis of right upper eyelid: Secondary | ICD-10-CM | POA: Diagnosis not present

## 2016-10-31 DIAGNOSIS — Z961 Presence of intraocular lens: Secondary | ICD-10-CM | POA: Diagnosis not present

## 2016-11-11 DIAGNOSIS — L821 Other seborrheic keratosis: Secondary | ICD-10-CM | POA: Diagnosis not present

## 2016-11-11 DIAGNOSIS — Z808 Family history of malignant neoplasm of other organs or systems: Secondary | ICD-10-CM | POA: Diagnosis not present

## 2016-11-11 DIAGNOSIS — D1722 Benign lipomatous neoplasm of skin and subcutaneous tissue of left arm: Secondary | ICD-10-CM | POA: Diagnosis not present

## 2016-11-11 DIAGNOSIS — D2272 Melanocytic nevi of left lower limb, including hip: Secondary | ICD-10-CM | POA: Diagnosis not present

## 2016-11-19 DIAGNOSIS — M7061 Trochanteric bursitis, right hip: Secondary | ICD-10-CM | POA: Diagnosis not present

## 2016-11-24 ENCOUNTER — Ambulatory Visit: Payer: PPO | Admitting: Vascular Surgery

## 2016-11-24 ENCOUNTER — Encounter: Payer: Self-pay | Admitting: Vascular Surgery

## 2016-11-24 VITALS — BP 119/80 | HR 53 | Temp 97.3°F | Resp 18 | Ht 71.0 in | Wt 220.0 lb

## 2016-11-24 DIAGNOSIS — I83892 Varicose veins of left lower extremities with other complications: Secondary | ICD-10-CM | POA: Diagnosis not present

## 2016-11-24 HISTORY — PX: OTHER SURGICAL HISTORY: SHX169

## 2016-11-24 NOTE — Progress Notes (Signed)
Subjective:     Patient ID: Christian [name], male   DOB: 11-03-1945, 71 y.o.   MRN: [id number]  HPI This 71 year old male had multiple stab phlebectomy of painful varicosities left leg performed under local tumescent anesthesia. He had greater than 30 phlebectomy's performed. He tolerated the procedure well.  Review of Systems     Objective:   Physical Exam BP 119/80 (BP Location: Left Arm, Patient Position: Sitting, Cuff Size: Large)   Pulse (!) 53   Temp (!) 97.3 F (36.3 C)   Resp 18   Ht 5\' 11"  (1.803 m)   Wt 220 lb (99.8 kg)   SpO2 98%   BMI 30.68 kg/m        Assessment:     Well-tolerated multiple stab phlebectomy painful varicosities left leg performed under local tumescent anesthesia    Plan:     Return December 17 for similar procedure and contralateral right leg

## 2016-11-24 NOTE — Progress Notes (Signed)
    Stab Phlebectomy Procedure  Christian [name] DOB:[birth year]  11/24/2016  Consent signed: Yes  Surgeon:J.D. Kellie Simmering, MD  Procedure: stab phlebectomy: left leg  BP 119/80 (BP Location: Left Arm, Patient Position: Sitting, Cuff Size: Large)   Pulse (!) 53   Temp (!) 97.3 F (36.3 C)   Resp 18   Ht 5\' 11"  (1.803 m)   Wt 220 lb (99.8 kg)   SpO2 98%   BMI 30.68 kg/m   Start time: 11:00AM    End time: 11:45AM   Tumescent Anesthesia: 400 cc 0.9% NaCl with 50 cc Lidocaine HCL and 15 cc 8.4% NaHCO3  Local Anesthesia: 8 cc Lidocaine HCL and NaHCO3 (ratio 2:1)    Stab Phlebectomy: >20 Sites: Thigh and Calf  Patient tolerated procedure well: Yes  Notes: Last dose of Xarelto was 11/09-2016.    Description of Procedure:  After marking the course of the secondary varicosities, the patient was placed on the operating table in the supine position, and the left leg was prepped and draped in sterile fashion.    The patient was then put into Trendelenburg position.  Local anesthetic was administered at the previously marked varicosities, and tumescent anesthesia was administered around the vessels.  Greater than 20 stab wounds were made using the tip of an 11 blade. And using the vein hook, the phlebectomies were performed using a hemostat to avulse the varicosities.  Adequate hemostasis was achieved, and steri strips were applied to the stab wound.      ABD pads and thigh high compression stockings were applied as well ace wraps where needed. Blood loss was less than 15 cc.  The patient ambulated out of the operating room having tolerated the procedure well.

## 2016-12-29 ENCOUNTER — Encounter: Payer: Self-pay | Admitting: Vascular Surgery

## 2016-12-29 ENCOUNTER — Ambulatory Visit: Payer: PPO | Admitting: Vascular Surgery

## 2016-12-29 VITALS — BP 116/81 | HR 53 | Temp 97.4°F | Resp 18 | Ht 71.0 in | Wt 217.0 lb

## 2016-12-29 DIAGNOSIS — I83891 Varicose veins of right lower extremities with other complications: Secondary | ICD-10-CM | POA: Diagnosis not present

## 2016-12-29 NOTE — Progress Notes (Signed)
    Stab Phlebectomy Procedure  Christian [name] DOB:1945-04-27  12/29/2016  Consent signed: Yes  Surgeon:J.D. Kellie Simmering, MD  Procedure: stab phlebectomy: right leg  BP 116/81 (BP Location: Left Arm, Patient Position: Sitting, Cuff Size: Normal)   Pulse (!) 53   Temp (!) 97.4 F (36.3 C) (Oral)   Resp 18   Ht 5\' 11"  (1.803 m)   Wt 217 lb (98.4 kg)   SpO2 99%   BMI 30.27 kg/m   Start time: 11:00AM   End time: 11:45AM   Tumescent Anesthesia: 425 cc 0.9% NaCl with 50 cc Lidocaine HCL1%and 15 cc 8.4% NaHCO3  Local Anesthesia: 9 cc Lidocaine HCL and NaHCO3 (ratio 2:1)   Stab Phlebectomy: >20 Sites: Calf  Patient tolerated procedure well: Yes  Notes: Last dose of Xarelto taken 12-26-16.   Description of Procedure:  After marking the course of the secondary varicosities, the patient was placed on the operating table in the prone position, and the right leg was prepped and draped in sterile fashion.    The patient was then put into Trendelenburg position.  Local anesthetic was administered at the previously marked varicosities, and tumescent anesthesia was administered around the vessels.  Greater than 20 stab wounds were made using the tip of an 11 blade. And using the vein hook, the phlebectomies were performed using a hemostat to avulse the varicosities.  Adequate hemostasis was achieved, and steri strips were applied to the stab wound.      ABD pads and thigh high compression stockings were applied as well ace wraps where needed. Blood loss was less than 15 cc.  The patient ambulated out of the operating room having tolerated the procedure well.

## 2016-12-29 NOTE — Progress Notes (Signed)
Subjective:     Patient ID: Christian [name], male   DOB: 03-16-1945, 71 y.o.   MRN: [id number]  HPI This 71 year old male had multiple stab phlebectomy-approximately 35-of painful varicosities performed under local tumescent anesthesia-left leg. He tolerated the procedure well.  Review of Systems     Objective:   Physical Exam BP 116/81 (BP Location: Left Arm, Patient Position: Sitting, Cuff Size: Normal)   Pulse (!) 53   Temp (!) 97.4 F (36.3 C) (Oral)   Resp 18   Ht 5\' 11"  (1.803 m)   Wt 217 lb (98.4 kg)   SpO2 99%   BMI 30.27 kg/m        Assessment:     Well-tolerated multiple stab phlebectomy painful varicosities left leg performed under local tumescent anesthesia    Plan:     Return in 3 months for final follow-up

## 2016-12-31 ENCOUNTER — Encounter: Payer: Self-pay | Admitting: Vascular Surgery

## 2016-12-31 DIAGNOSIS — N401 Enlarged prostate with lower urinary tract symptoms: Secondary | ICD-10-CM | POA: Diagnosis not present

## 2016-12-31 DIAGNOSIS — R351 Nocturia: Secondary | ICD-10-CM | POA: Diagnosis not present

## 2016-12-31 DIAGNOSIS — R3912 Poor urinary stream: Secondary | ICD-10-CM | POA: Diagnosis not present

## 2017-01-14 DIAGNOSIS — R69 Illness, unspecified: Secondary | ICD-10-CM | POA: Diagnosis not present

## 2017-02-02 DIAGNOSIS — I480 Paroxysmal atrial fibrillation: Secondary | ICD-10-CM | POA: Diagnosis not present

## 2017-02-02 DIAGNOSIS — I10 Essential (primary) hypertension: Secondary | ICD-10-CM | POA: Diagnosis not present

## 2017-02-02 DIAGNOSIS — R69 Illness, unspecified: Secondary | ICD-10-CM | POA: Diagnosis not present

## 2017-02-02 DIAGNOSIS — N529 Male erectile dysfunction, unspecified: Secondary | ICD-10-CM | POA: Diagnosis not present

## 2017-02-04 DIAGNOSIS — J069 Acute upper respiratory infection, unspecified: Secondary | ICD-10-CM | POA: Diagnosis not present

## 2017-02-04 DIAGNOSIS — J209 Acute bronchitis, unspecified: Secondary | ICD-10-CM | POA: Diagnosis not present

## 2017-02-17 DIAGNOSIS — R05 Cough: Secondary | ICD-10-CM | POA: Diagnosis not present

## 2017-02-17 DIAGNOSIS — J301 Allergic rhinitis due to pollen: Secondary | ICD-10-CM | POA: Diagnosis not present

## 2017-02-17 DIAGNOSIS — J3089 Other allergic rhinitis: Secondary | ICD-10-CM | POA: Diagnosis not present

## 2017-02-17 DIAGNOSIS — H1045 Other chronic allergic conjunctivitis: Secondary | ICD-10-CM | POA: Diagnosis not present

## 2017-03-29 IMAGING — CR DG CHEST 2V
2 series · 2 of 2 positions shown · non-contrast
Comparison: 04/23/2014 and earlier.

CLINICAL DATA: 69-year-old male with several month symptoms of
cough, shortness of breath. Chronic bronchitis. Former smoker.
Initial encounter.

EXAM:
CHEST  2 VIEW

[w chest pa]
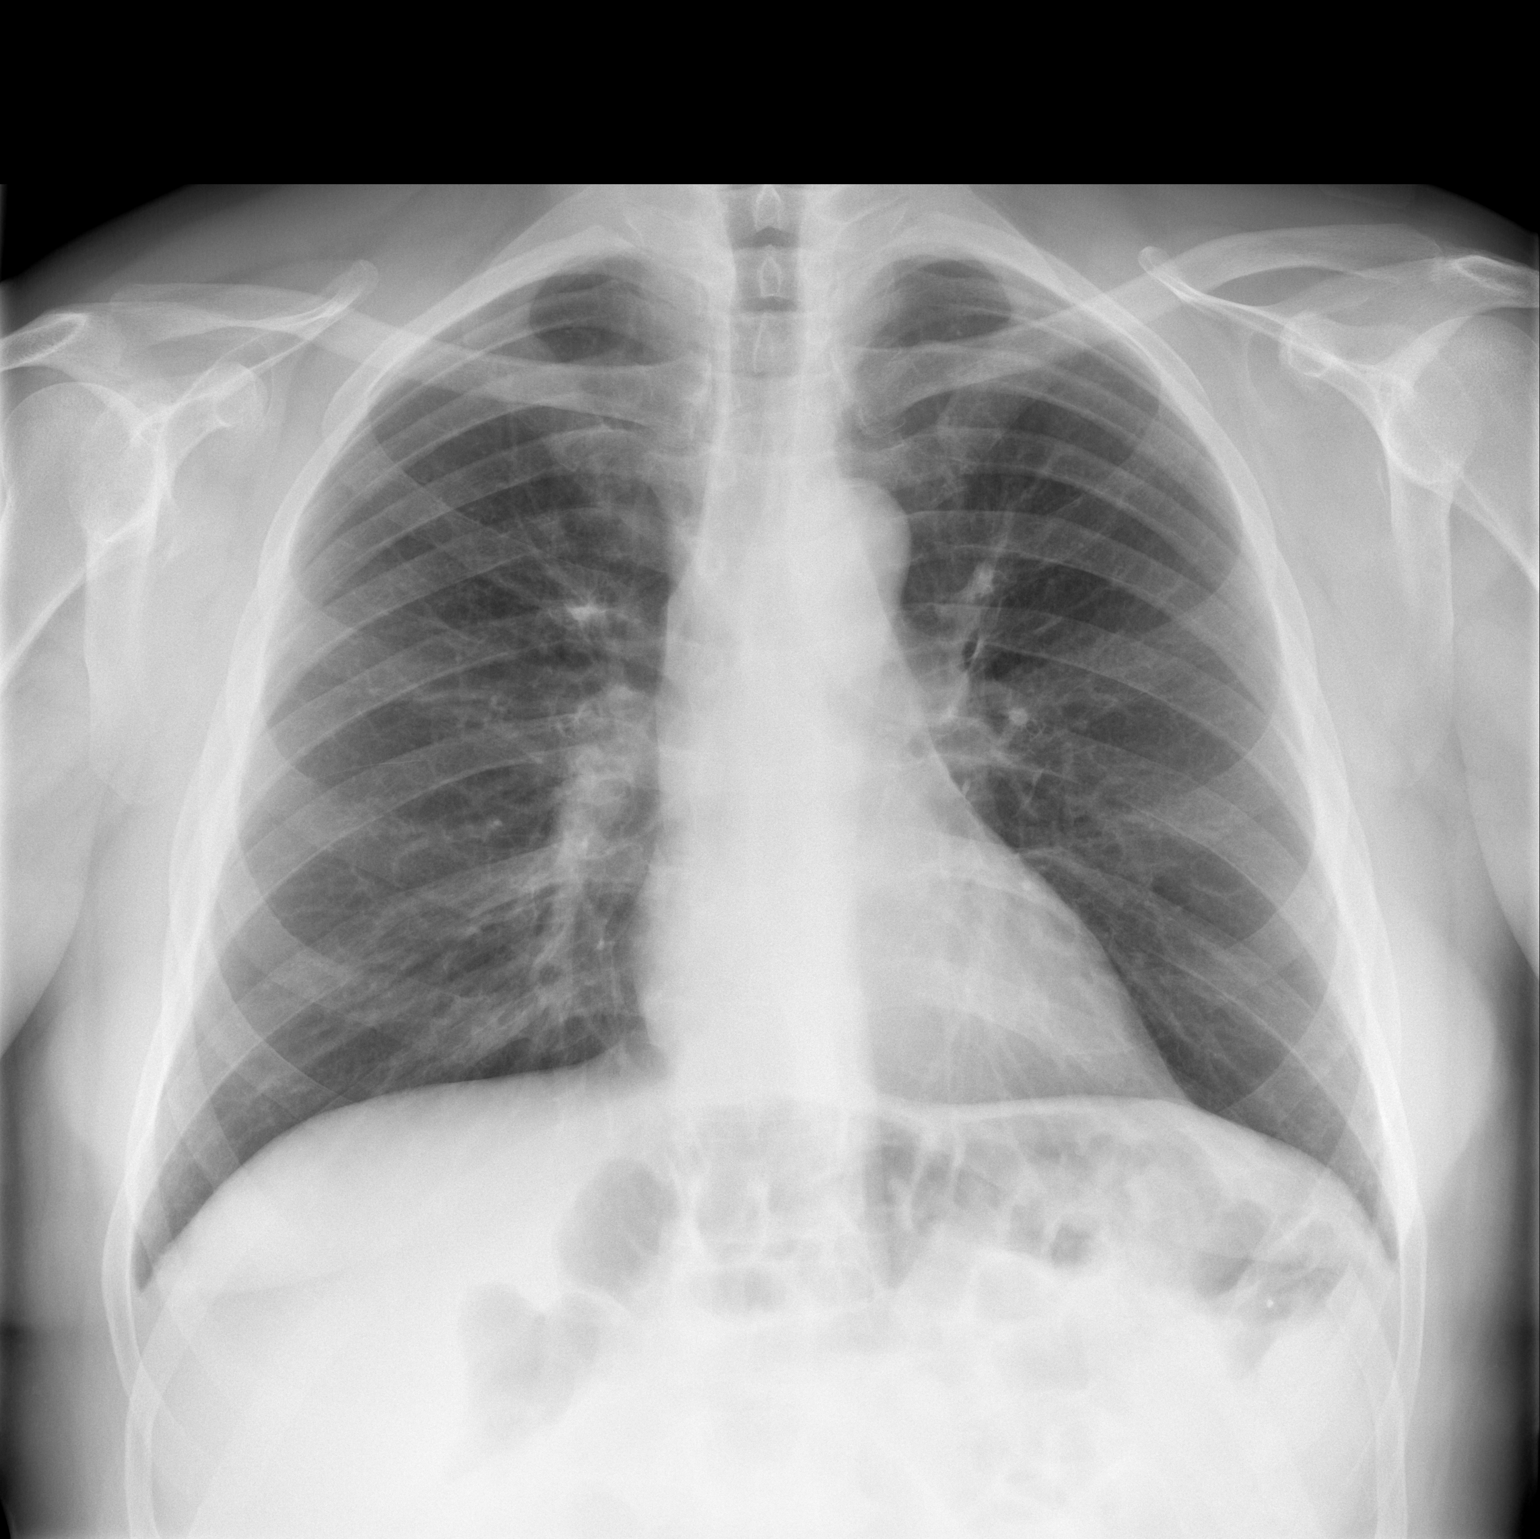

[w chest lat]
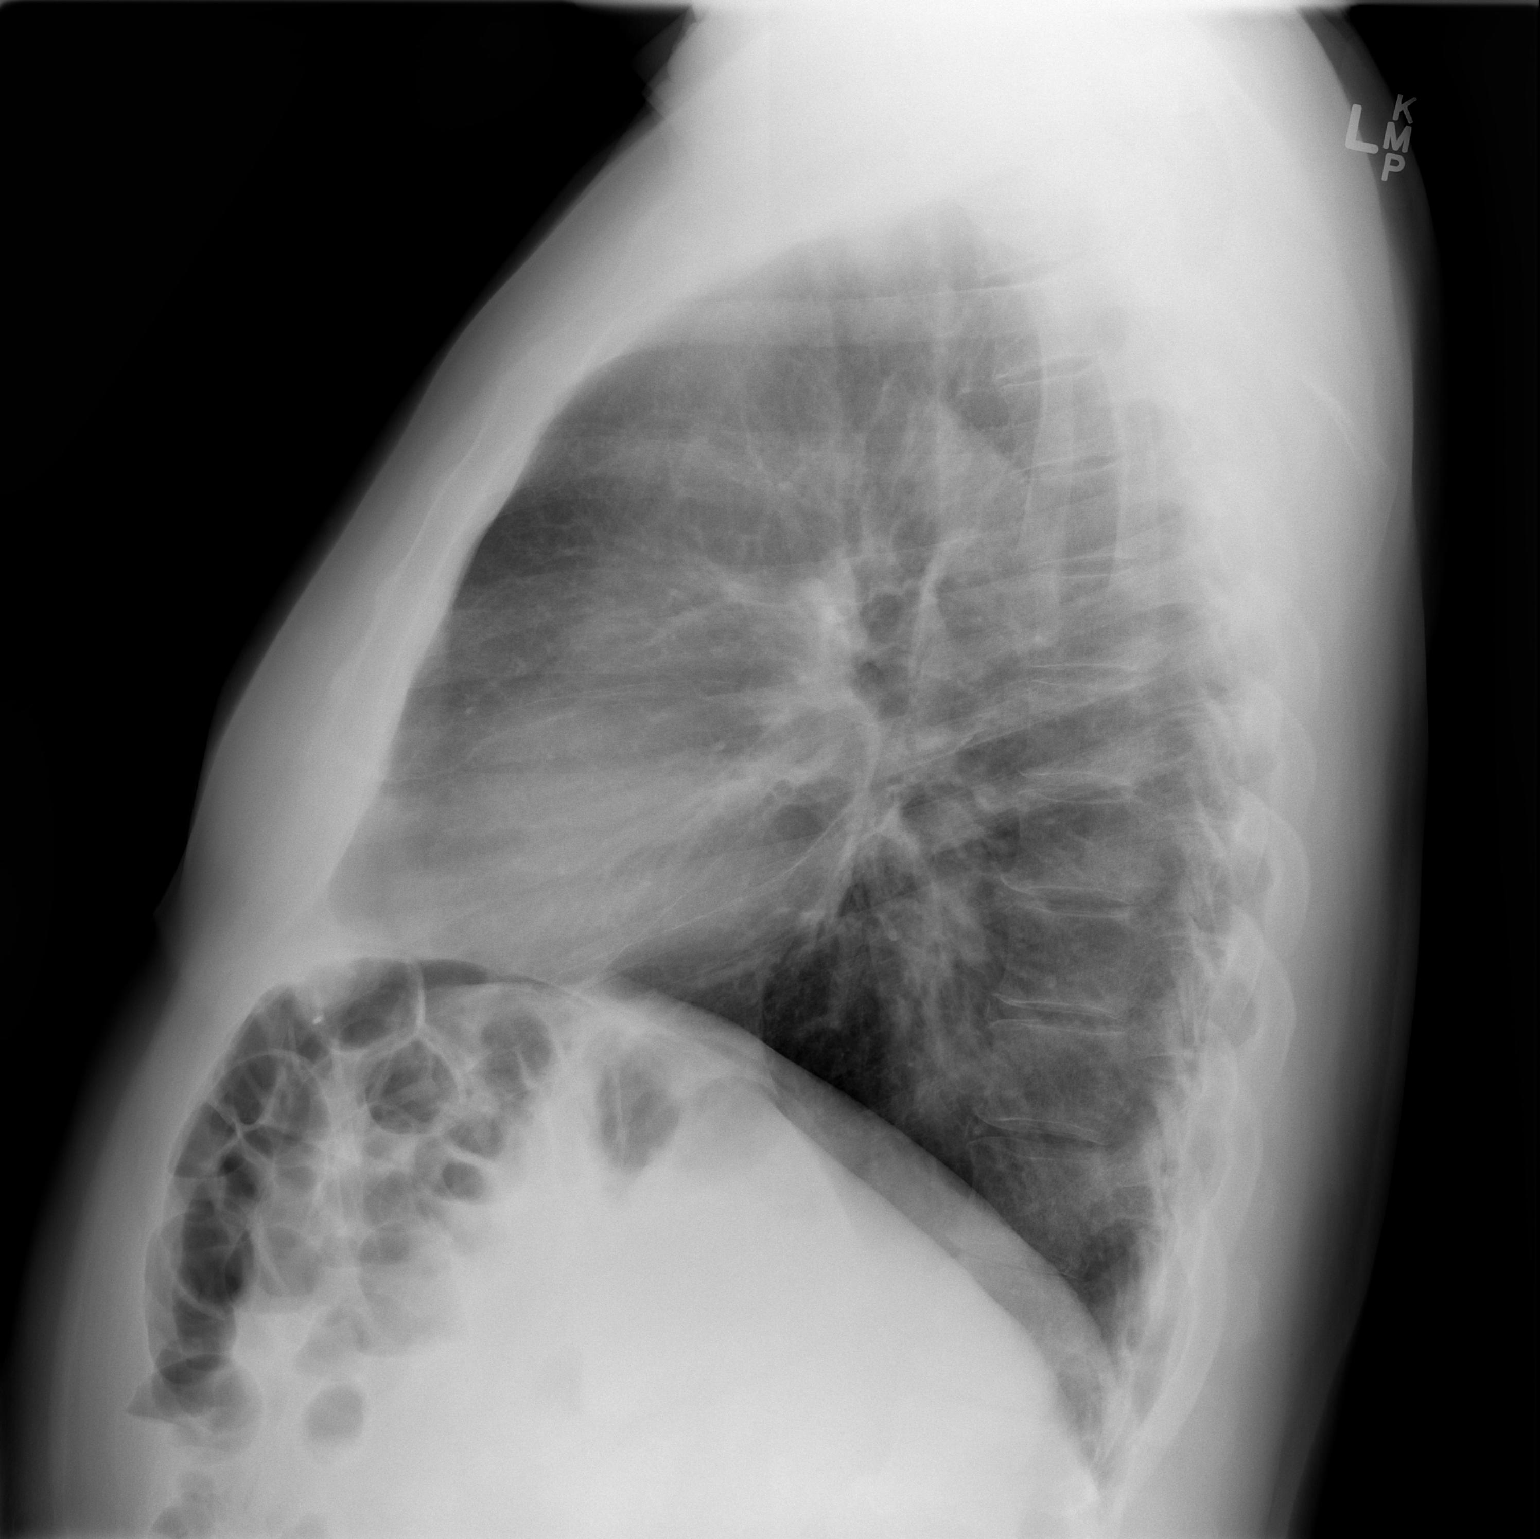

[2 of 2 positions shown; findings below may reference images not displayed]

FINDINGS: Lung volumes are stable and within normal limits. Normal cardiac
size and mediastinal contours. Visualized tracheal air column is
within normal limits. No pneumothorax, pulmonary edema, pleural
effusion or confluent pulmonary opacity. No osseous abnormality
identified. Negative visible bowel gas pattern.
IMPRESSION: Negative, no acute cardiopulmonary abnormality.

## 2017-03-30 ENCOUNTER — Other Ambulatory Visit: Payer: Self-pay

## 2017-03-30 ENCOUNTER — Ambulatory Visit: Payer: Medicare HMO | Admitting: Vascular Surgery

## 2017-03-30 ENCOUNTER — Encounter: Payer: Self-pay | Admitting: Vascular Surgery

## 2017-03-30 VITALS — BP 118/76 | HR 58 | Temp 97.2°F | Resp 18 | Ht 71.0 in | Wt 222.0 lb

## 2017-03-30 DIAGNOSIS — I83893 Varicose veins of bilateral lower extremities with other complications: Secondary | ICD-10-CM

## 2017-03-30 NOTE — Progress Notes (Signed)
Subjective:     Patient ID: Christian [name], male   DOB: 04-Mar-1945, 72 y.o.   MRN: [id number]  HPI This 72 year old male returns for final follow-up regarding his bilateral great saphenous vein laser ablations and bilateral multiple stab phlebectomy of painful varicosities performed 3 months ago.  He has had no significant swelling in the ankles.  He is no longer wearing elastic compression stockings.  He has no specific complaints.  Past Medical History:  Diagnosis Date  . Arthritis   . BPH (benign prostatic hypertrophy)   . Congenital posterior urethral valve   . DDD (degenerative disc disease), cervical   . DDD (degenerative disc disease), lumbar   . Factor V deficiency ([place])   . Family history of atrial fibrillation    His brother, sister have A. fib, suspects his father also had A. fib  . Frequency of urination   . H/O bundle branch block   . History of gastroesophageal reflux (GERD)   . Hypertension   . Nocturia   . Shortness of breath dyspnea     Social History   Tobacco Use  . Smoking status: Former Smoker    Packs/day: 1.00    Years: 20.00    Pack years: 20.00    Types: Cigarettes    Last attempt to quit: 02/28/1995    Years since quitting: 22.0  . Smokeless tobacco: Never Used  Substance Use Topics  . Alcohol use: Yes    Alcohol/week: 6.0 oz    Types: 10 Shots of liquor per week    Family History  Problem Relation Age of Onset  . Atrial fibrillation Father   . Atrial fibrillation Sister   . Factor V Leiden deficiency Sister   . Atrial fibrillation Brother   . Factor V Leiden deficiency Brother     Allergies  Allergen Reactions  . Gin [Alcohol] Other (See Comments)    Pins and needles feeling  . Adhesive [Tape] Other (See Comments)    Redness/ irritation  . Lobster [Shellfish Allergy] Other (See Comments)    Allergy test     Current Outpatient Medications:  .  atorvastatin (LIPITOR) 10 MG tablet, Take 1 tablet (10 mg total) by mouth daily at 6 PM.,  Disp: 30 tablet, Rfl: 2 .  Cholecalciferol (D-3-5) 5000 UNITS capsule, Take 5,000 Units by mouth daily., Disp: , Rfl:  .  Coenzyme Q10 (COQ10) 200 MG CAPS, Take 300 mg by mouth daily. , Disp: , Rfl:  .  fexofenadine (ALLEGRA) 180 MG tablet, Take 180 mg by mouth daily., Disp: , Rfl:  .  flecainide (TAMBOCOR) 100 MG tablet, Take 100 mg by mouth 2 (two) times daily., Disp: , Rfl: 1 .  fluticasone (FLONASE) 50 MCG/ACT nasal spray, Place 1-2 sprays into both nostrils daily as needed for allergies. , Disp: , Rfl:  .  levocetirizine (XYZAL) 5 MG tablet, , Disp: , Rfl:  .  losartan-hydrochlorothiazide (HYZAAR) 100-12.5 MG tablet, , Disp: , Rfl:  .  methocarbamol (ROBAXIN) 500 MG tablet, every 8 (eight) hours as needed. , Disp: , Rfl:  .  metoprolol tartrate (LOPRESSOR) 25 MG tablet, Take 1 tablet (25 mg total) by mouth 3 (three) times daily., Disp: 90 tablet, Rfl: 1 .  montelukast (SINGULAIR) 10 MG tablet, Take 10 mg by mouth at bedtime., Disp: , Rfl: 3 .  Multiple Vitamins-Minerals (CENTRUM SILVER PO), Take 1 tablet by mouth daily., Disp: , Rfl:  .  MYRBETRIQ 25 MG TB24 tablet, , Disp: , Rfl:  .  omeprazole (PRILOSEC) 20 MG capsule, Take 20 mg by mouth daily. TAKES PRILOSEC WITH DICLOFENAC, Disp: , Rfl:  .  PROAIR RESPICLICK 876 (90 Base) MCG/ACT AEPB, Inhale 1 puff into the lungs daily., Disp: , Rfl: 0 .  Probiotic Product (PROBIOTIC ADVANCED PO), Take by mouth daily., Disp: , Rfl:  .  rivaroxaban (XARELTO) 20 MG TABS tablet, Take 1 tablet (20 mg total) by mouth daily after supper., Disp: 30 tablet, Rfl: 2 .  SYMBICORT 160-4.5 MCG/ACT inhaler, , Disp: , Rfl:  .  traMADol (ULTRAM) 50 MG tablet, Take 1 tablet (50 mg total) by mouth every 6 (six) hours as needed for moderate pain., Disp: 60 tablet, Rfl: 1 .  Turmeric 500 MG CAPS, Take by mouth., Disp: , Rfl:  .  zolpidem (AMBIEN) 5 MG tablet, Take 5 mg by mouth at bedtime as needed for sleep. , Disp: , Rfl:   Vitals:   03/30/17 1039  BP: 118/76   Pulse: (!) 58  Resp: 18  Temp: (!) 97.2 F (36.2 C)  TempSrc: Oral  SpO2: 97%  Weight: 222 lb (100.7 kg)  Height: 5\' 11"  (1.803 m)    Body mass index is 30.96 kg/m.         Review of Systems     Objective:   Physical Exam BP 118/76 (BP Location: Left Arm, Patient Position: Sitting, Cuff Size: Large)   Pulse (!) 58   Temp (!) 97.2 F (36.2 C) (Oral)   Resp 18   Ht 5\' 11"  (1.803 m)   Wt 222 lb (100.7 kg)   SpO2 97%   BMI 30.96 kg/m   General well-developed well-nourished male no apparent distress alert and oriented x3 Right leg with some small residual varicosities in the medial calf but the stab phlebectomy sites mostly concentrated in the posterior calf all nicely healed with no distal edema No significant varicosities remain in the left leg with no distal edema     Assessment:     Successful bilateral great saphenous vein ablations with multiple stab phlebectomy of residual varicosities with no complaints-good early result    Plan:     Return to see me on a as needed basis

## 2017-04-30 DIAGNOSIS — I10 Essential (primary) hypertension: Secondary | ICD-10-CM | POA: Diagnosis not present

## 2017-04-30 DIAGNOSIS — R7303 Prediabetes: Secondary | ICD-10-CM | POA: Diagnosis not present

## 2017-04-30 DIAGNOSIS — H6123 Impacted cerumen, bilateral: Secondary | ICD-10-CM | POA: Diagnosis not present

## 2017-04-30 DIAGNOSIS — I4891 Unspecified atrial fibrillation: Secondary | ICD-10-CM | POA: Diagnosis not present

## 2017-07-25 DIAGNOSIS — H60393 Other infective otitis externa, bilateral: Secondary | ICD-10-CM | POA: Diagnosis not present

## 2017-07-25 DIAGNOSIS — I482 Chronic atrial fibrillation: Secondary | ICD-10-CM | POA: Diagnosis not present

## 2017-07-25 DIAGNOSIS — J3089 Other allergic rhinitis: Secondary | ICD-10-CM | POA: Diagnosis not present

## 2017-07-25 DIAGNOSIS — I10 Essential (primary) hypertension: Secondary | ICD-10-CM | POA: Diagnosis not present

## 2017-08-04 DIAGNOSIS — Z0189 Encounter for other specified special examinations: Secondary | ICD-10-CM | POA: Diagnosis not present

## 2017-08-04 DIAGNOSIS — I10 Essential (primary) hypertension: Secondary | ICD-10-CM | POA: Diagnosis not present

## 2017-08-04 DIAGNOSIS — R69 Illness, unspecified: Secondary | ICD-10-CM | POA: Diagnosis not present

## 2017-08-04 DIAGNOSIS — I480 Paroxysmal atrial fibrillation: Secondary | ICD-10-CM | POA: Diagnosis not present

## 2017-08-11 DIAGNOSIS — J42 Unspecified chronic bronchitis: Secondary | ICD-10-CM | POA: Diagnosis not present

## 2017-08-11 DIAGNOSIS — N400 Enlarged prostate without lower urinary tract symptoms: Secondary | ICD-10-CM | POA: Diagnosis not present

## 2017-08-11 DIAGNOSIS — I10 Essential (primary) hypertension: Secondary | ICD-10-CM | POA: Diagnosis not present

## 2017-08-11 DIAGNOSIS — M47812 Spondylosis without myelopathy or radiculopathy, cervical region: Secondary | ICD-10-CM | POA: Diagnosis not present

## 2017-08-11 DIAGNOSIS — I4891 Unspecified atrial fibrillation: Secondary | ICD-10-CM | POA: Diagnosis not present

## 2017-08-11 DIAGNOSIS — E785 Hyperlipidemia, unspecified: Secondary | ICD-10-CM | POA: Diagnosis not present

## 2017-08-11 DIAGNOSIS — N401 Enlarged prostate with lower urinary tract symptoms: Secondary | ICD-10-CM | POA: Diagnosis not present

## 2017-08-13 IMAGING — DX DG CHEST 1V PORT
1 series · 1 of 1 positions shown · non-contrast
Comparison: 04/12/2015

CLINICAL DATA: Dizziness and atrial fibrillation

EXAM:
PORTABLE CHEST 1 VIEW

[chest ap]
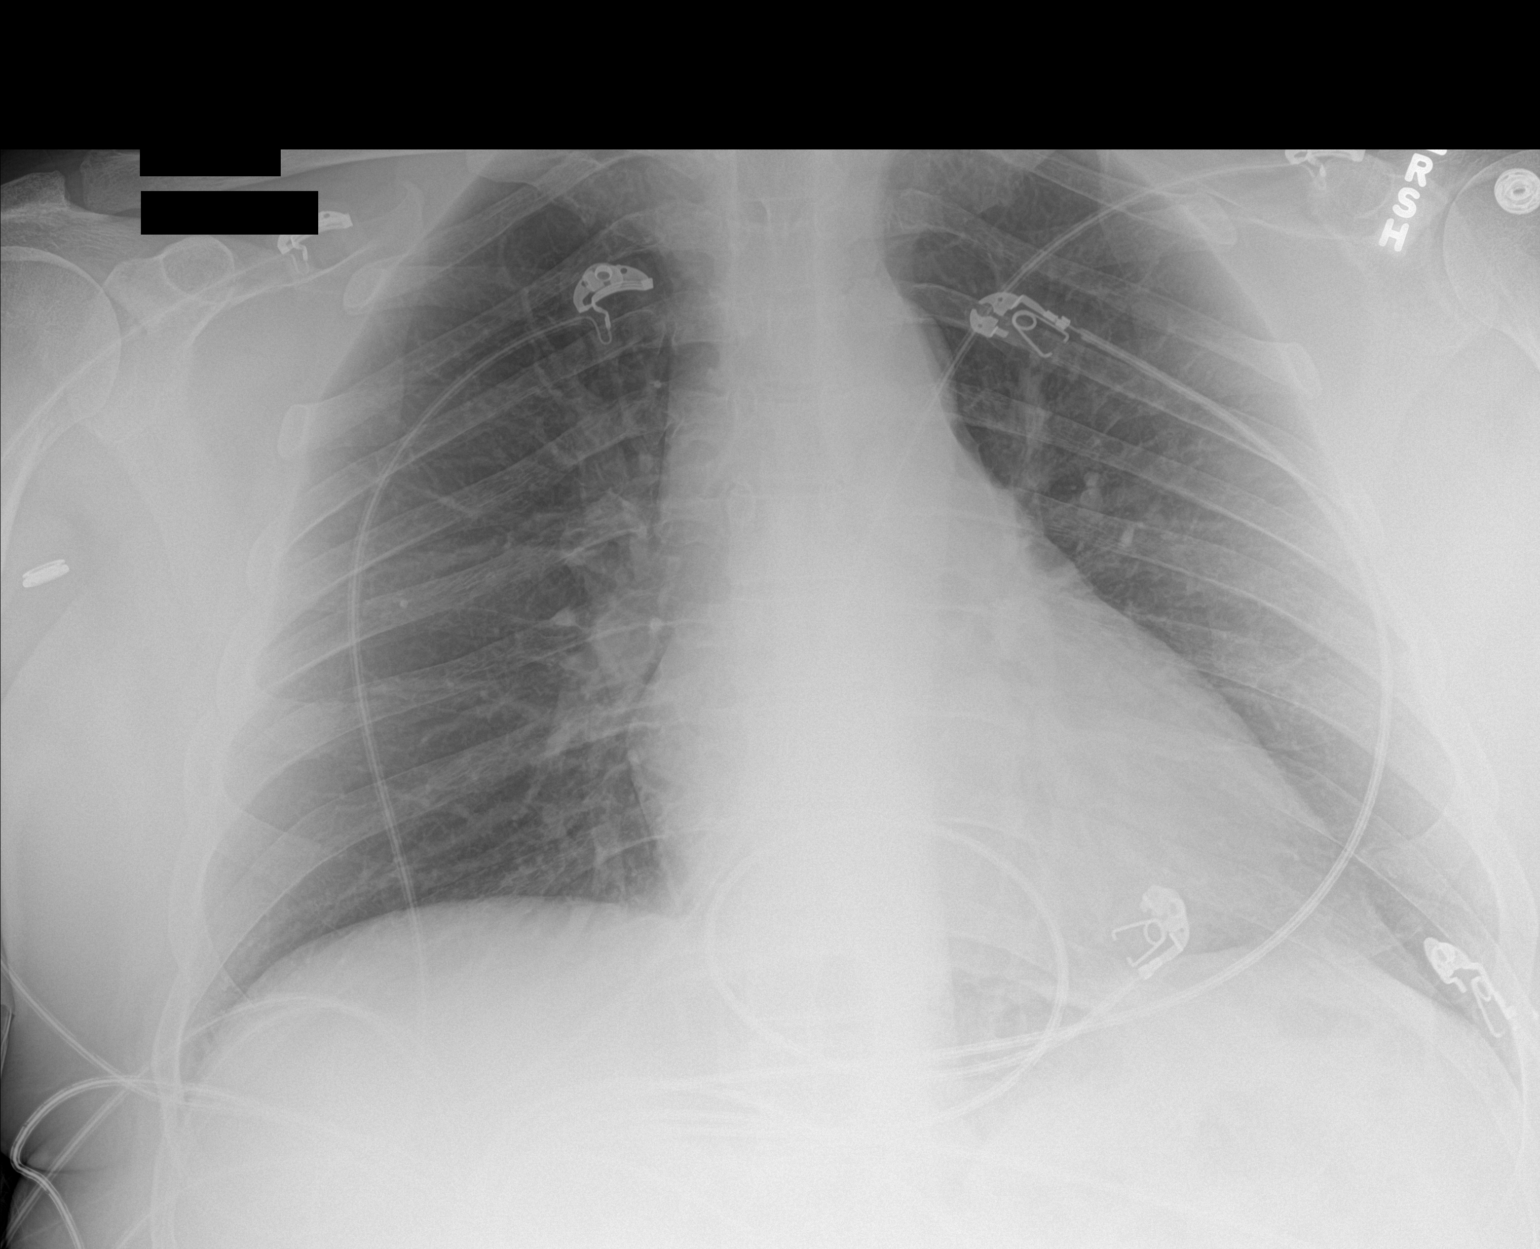

[1 of 1 positions shown; findings below may reference images not displayed]

FINDINGS: The heart size and mediastinal contours are within normal limits.
Both lungs are clear. The visualized skeletal structures are
unremarkable.
IMPRESSION: No active disease.

## 2017-08-31 DIAGNOSIS — I480 Paroxysmal atrial fibrillation: Secondary | ICD-10-CM | POA: Diagnosis not present

## 2017-08-31 DIAGNOSIS — I10 Essential (primary) hypertension: Secondary | ICD-10-CM | POA: Diagnosis not present

## 2017-09-13 DIAGNOSIS — R197 Diarrhea, unspecified: Secondary | ICD-10-CM | POA: Diagnosis not present

## 2017-09-22 DIAGNOSIS — R69 Illness, unspecified: Secondary | ICD-10-CM | POA: Diagnosis not present

## 2017-10-02 DIAGNOSIS — R05 Cough: Secondary | ICD-10-CM | POA: Diagnosis not present

## 2017-10-02 DIAGNOSIS — J301 Allergic rhinitis due to pollen: Secondary | ICD-10-CM | POA: Diagnosis not present

## 2017-10-02 DIAGNOSIS — J3089 Other allergic rhinitis: Secondary | ICD-10-CM | POA: Diagnosis not present

## 2017-10-02 DIAGNOSIS — H1045 Other chronic allergic conjunctivitis: Secondary | ICD-10-CM | POA: Diagnosis not present

## 2017-11-05 DIAGNOSIS — I10 Essential (primary) hypertension: Secondary | ICD-10-CM | POA: Diagnosis not present

## 2017-11-05 DIAGNOSIS — N401 Enlarged prostate with lower urinary tract symptoms: Secondary | ICD-10-CM | POA: Diagnosis not present

## 2017-11-05 DIAGNOSIS — I4891 Unspecified atrial fibrillation: Secondary | ICD-10-CM | POA: Diagnosis not present

## 2017-11-05 DIAGNOSIS — M47812 Spondylosis without myelopathy or radiculopathy, cervical region: Secondary | ICD-10-CM | POA: Diagnosis not present

## 2017-11-05 DIAGNOSIS — E785 Hyperlipidemia, unspecified: Secondary | ICD-10-CM | POA: Diagnosis not present

## 2017-11-05 DIAGNOSIS — Z0001 Encounter for general adult medical examination with abnormal findings: Secondary | ICD-10-CM | POA: Diagnosis not present

## 2017-11-05 DIAGNOSIS — Z1389 Encounter for screening for other disorder: Secondary | ICD-10-CM | POA: Diagnosis not present

## 2017-11-05 DIAGNOSIS — G479 Sleep disorder, unspecified: Secondary | ICD-10-CM | POA: Diagnosis not present

## 2017-11-05 DIAGNOSIS — J309 Allergic rhinitis, unspecified: Secondary | ICD-10-CM | POA: Diagnosis not present

## 2017-11-05 DIAGNOSIS — R7309 Other abnormal glucose: Secondary | ICD-10-CM | POA: Diagnosis not present

## 2017-11-18 DIAGNOSIS — D1722 Benign lipomatous neoplasm of skin and subcutaneous tissue of left arm: Secondary | ICD-10-CM | POA: Diagnosis not present

## 2017-11-18 DIAGNOSIS — L570 Actinic keratosis: Secondary | ICD-10-CM | POA: Diagnosis not present

## 2017-11-18 DIAGNOSIS — Z23 Encounter for immunization: Secondary | ICD-10-CM | POA: Diagnosis not present

## 2017-11-18 DIAGNOSIS — L821 Other seborrheic keratosis: Secondary | ICD-10-CM | POA: Diagnosis not present

## 2017-12-07 DIAGNOSIS — Z961 Presence of intraocular lens: Secondary | ICD-10-CM | POA: Diagnosis not present

## 2017-12-07 DIAGNOSIS — H04123 Dry eye syndrome of bilateral lacrimal glands: Secondary | ICD-10-CM | POA: Diagnosis not present

## 2017-12-07 DIAGNOSIS — D3131 Benign neoplasm of right choroid: Secondary | ICD-10-CM | POA: Diagnosis not present

## 2018-01-10 DIAGNOSIS — Z0100 Encounter for examination of eyes and vision without abnormal findings: Secondary | ICD-10-CM | POA: Diagnosis not present

## 2018-02-16 ENCOUNTER — Other Ambulatory Visit: Payer: Self-pay

## 2018-02-18 DIAGNOSIS — R351 Nocturia: Secondary | ICD-10-CM | POA: Diagnosis not present

## 2018-02-18 DIAGNOSIS — N528 Other male erectile dysfunction: Secondary | ICD-10-CM | POA: Diagnosis not present

## 2018-02-18 DIAGNOSIS — N401 Enlarged prostate with lower urinary tract symptoms: Secondary | ICD-10-CM | POA: Diagnosis not present

## 2018-03-09 DIAGNOSIS — J42 Unspecified chronic bronchitis: Secondary | ICD-10-CM | POA: Diagnosis not present

## 2018-03-09 DIAGNOSIS — E785 Hyperlipidemia, unspecified: Secondary | ICD-10-CM | POA: Diagnosis not present

## 2018-03-09 DIAGNOSIS — N401 Enlarged prostate with lower urinary tract symptoms: Secondary | ICD-10-CM | POA: Diagnosis not present

## 2018-03-09 DIAGNOSIS — J40 Bronchitis, not specified as acute or chronic: Secondary | ICD-10-CM | POA: Diagnosis not present

## 2018-03-09 DIAGNOSIS — N400 Enlarged prostate without lower urinary tract symptoms: Secondary | ICD-10-CM | POA: Diagnosis not present

## 2018-03-09 DIAGNOSIS — I10 Essential (primary) hypertension: Secondary | ICD-10-CM | POA: Diagnosis not present

## 2018-03-09 DIAGNOSIS — I4891 Unspecified atrial fibrillation: Secondary | ICD-10-CM | POA: Diagnosis not present

## 2018-03-09 DIAGNOSIS — M47812 Spondylosis without myelopathy or radiculopathy, cervical region: Secondary | ICD-10-CM | POA: Diagnosis not present

## 2018-03-21 NOTE — Progress Notes (Signed)
Subjective:  Primary Physician:  Josetta Huddle, MD  Patient ID: Christian [name], male    DOB: 1945/02/02, 73 y.o.   MRN: [id number]  Chief Complaint  Patient presents with  . Atrial Fibrillation    HPI: Christian [name]  is a 73 y.o. male  with  history of atrial fibrillation when he presented to the emergency room on 08/27/2015, started on anticoagulation and discharged home, however he converted back to sinus rhythm spontaneously. He has h/o bilateral venous insufficiency, ablation of left leg venous insufficiency in Jan 2019 with significant improvement in edema.  He is here on a six-month office visit and follow-up of paroxysmal atrial fibrillation and hypertension, states that he is doing well and denies any GI bleeding, no further palpitation since being on flecainide, no dizziness or syncope.  Past Medical History:  Diagnosis Date  . Arthritis   . BPH (benign prostatic hypertrophy)   . Congenital posterior urethral valve   . DDD (degenerative disc disease), cervical   . DDD (degenerative disc disease), lumbar   . Factor V deficiency ([place])   . Family history of atrial fibrillation    His brother, sister have A. fib, suspects his father also had A. fib  . Frequency of urination   . H/O bundle branch block   . History of gastroesophageal reflux (GERD)   . Hypertension   . Nocturia   . Shortness of breath dyspnea     Past Surgical History:  Procedure Laterality Date  . CATARACT EXTRACTION W/ INTRAOCULAR LENS  IMPLANT, BILATERAL  3 yrs ago  . COLONOSCOPY N/A 06/27/2014   Procedure: COLONOSCOPY;  Surgeon: Garlan Fair, MD;  Location: [facility];  Service: Endoscopy;  Laterality: N/A;  . ENDOVENOUS ABLATION SAPHENOUS VEIN W/ LASER Right 06/30/2016   EVLA R GSV by Tinnie Gens MD   . ENDOVENOUS ABLATION SAPHENOUS VEIN W/ LASER Left 07/14/2016   endovenous laser ablation Left greater saphenous vein by Tinnie Gens MD   . NO PAST SURGERIES    . REPAIR CONGENITAL URETHRAL  DEFECT  INFANT  . stab phlebectomy Left 11/24/2016   stab phlebectomy > 20 incisions left leg by Tinnie Gens MD   . stab phlebectomy  Right `12-29-2016   stab phlebectomy > 20 incisions right leg by Tinnie Gens MD   . 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 Marital status: Married    Spouse name: Not on file  . Number of children: Not on file  . Years of education: Not on file  . Highest education level: Not on file  Occupational History  . Not on file  Social Needs  . Financial resource strain: Not on file  . Food insecurity:    Worry: Not on file    Inability: Not on file  . Transportation needs:    Medical: Not on file    Non-medical: Not on file  Tobacco Use  . Smoking status: Former Smoker    Packs/day: 1.00    Years: 20.00    Pack years: 20.00    Types: Cigarettes    Last attempt to quit: 02/28/1995    Years since quitting: 23.0  . Smokeless tobacco: Never Used  Substance and Sexual Activity  . Alcohol  use: Yes    Alcohol/week: 10.0 standard drinks    Types: 10 Shots of liquor per week  . Drug use: No  . Sexual activity: Not on file  Lifestyle  . Physical activity:    Days per week: Not on file    Minutes per session: Not on file  . Stress: Not on file  Relationships  . Social connections:    Talks on phone: Not on file    Gets together: Not on file    Attends religious service: Not on file    Active member of club or organization: Not on file    Attends meetings of clubs or organizations: Not on file    Relationship status: Not on file  . Intimate partner violence:    Fear of current or ex partner: Not on file    Emotionally abused: Not on file    Physically abused: Not on file    Forced  sexual activity: Not on file  Other Topics Concern  . Not on file  Social History Narrative  . Not on file    Current Outpatient Medications on File Prior to Visit  Medication Sig Dispense Refill  . acetaminophen (TYLENOL) 500 MG tablet Take 1,000 mg by mouth every 6 (six) hours as needed.    Marland Kitchen atorvastatin (LIPITOR) 10 MG tablet Take 1 tablet (10 mg total) by mouth daily at 6 PM. 30 tablet 2  . Cholecalciferol (D-3-5) 5000 UNITS capsule Take 5,000 Units by mouth daily.    . Coenzyme Q10 (COQ10) 200 MG CAPS Take 300 mg by mouth daily.     . fexofenadine (ALLEGRA) 180 MG tablet Take 180 mg by mouth daily.    . flecainide (TAMBOCOR) 100 MG tablet Take 100 mg by mouth 2 (two) times daily.  1  . fluticasone (FLONASE) 50 MCG/ACT nasal spray Place 1-2 sprays into both nostrils daily as needed for allergies.     Marland Kitchen levocetirizine (XYZAL) 5 MG tablet     . losartan-hydrochlorothiazide (HYZAAR) 100-12.5 MG tablet     . methocarbamol (ROBAXIN) 500 MG tablet every 8 (eight) hours as needed.     . metoprolol tartrate (LOPRESSOR) 25 MG tablet Take 1 tablet (25 mg total) by mouth 3 (three) times daily. (Patient taking differently: Take 25 mg by mouth 2 (two) times daily. ) 90 tablet 1  . montelukast (SINGULAIR) 10 MG tablet Take 10 mg by mouth at bedtime.  3  . Multiple Vitamins-Minerals (CENTRUM SILVER PO) Take 1 tablet by mouth daily.    Marland Kitchen omeprazole (PRILOSEC) 20 MG capsule Take 20 mg by mouth daily. TAKES PRILOSEC WITH DICLOFENAC    . PROAIR RESPICLICK 631 (90 Base) MCG/ACT AEPB Inhale 1 puff into the lungs daily.  0  . rivaroxaban (XARELTO) 20 MG TABS tablet Take 1 tablet (20 mg total) by mouth daily after supper. 30 tablet 2  . sildenafil (VIAGRA) 50 MG tablet Take 50 mg by mouth daily as needed for erectile dysfunction.    . SYMBICORT 160-4.5 MCG/ACT inhaler     . tamsulosin (FLOMAX) 0.4 MG CAPS capsule Take 0.4 mg by mouth.    . traMADol (ULTRAM) 50 MG tablet Take 1 tablet (50 mg total) by mouth  every 6 (six) hours as needed for moderate pain. (Patient taking differently: Take 50 mg by mouth daily. ) 60 tablet 1  . zolpidem (AMBIEN) 5 MG tablet Take 5 mg by mouth at bedtime as needed for sleep.     . Probiotic Product (  PROBIOTIC ADVANCED PO) Take by mouth daily.     No current facility-administered medications on file prior to visit.     Review of Systems  Constitutional: Negative for malaise/fatigue and weight loss.  Respiratory: Negative for cough, hemoptysis and shortness of breath.   Cardiovascular: Negative for chest pain, palpitations, claudication and leg swelling.  Gastrointestinal: Negative for abdominal pain, blood in stool, constipation, heartburn and vomiting.  Genitourinary: Negative for dysuria.       Erectile dysfunction  Musculoskeletal: Negative for joint pain and myalgias.  Neurological: Negative for dizziness, focal weakness and headaches.  Endo/Heme/Allergies: Does not bruise/bleed easily.  Psychiatric/Behavioral: Negative for depression. The patient is not nervous/anxious.   All other systems reviewed and are negative.      Objective:  Blood pressure 116/85, pulse (!) 58, height 5\' 11"  (1.803 m), weight 223 lb (101.2 kg), SpO2 98 %. Body mass index is 31.1 kg/m.  Physical Exam  Constitutional: He appears well-developed. No distress.  Mildly obese  HENT:  Head: Atraumatic.  Eyes: Conjunctivae are normal.  Neck: Neck supple. No JVD present. No thyromegaly present.  Cardiovascular: Normal rate, regular rhythm, normal heart sounds and intact distal pulses. Exam reveals no gallop.  No murmur heard. Pulmonary/Chest: Effort normal and breath sounds normal.  Abdominal: Soft. Bowel sounds are normal.  Musculoskeletal: Normal range of motion.        General: No edema.  Neurological: He is alert.  Skin: Skin is warm and dry.  Psychiatric: He has a normal mood and affect.    CARDIAC STUDIES:    Echocardiogram 09/05/2015: Left ventricle cavity is  normal in size. Mild concentric hypertrophy of the left ventricle. Normal global wall motion. Normal diastolic filling pattern. Calculated EF 55%. Left atrial cavity is moderately dilated at 4.5 cm. Trace mitral regurgitation. Mild tricuspid regurgitation. No evidence of pulmonary hypertension.  Exercise Treadmill Stress Test 08/31/2017:  Indication: SoB  The patient exercised on Bruce protocol for  10:05 min. Patient achieved  10.16 METS and reached HR  124 bpm, which is   82% of maximum age-predicted HR.  Stress test terminated due to Fatigue.   Exercise capacity was normal. HR Response to Exercise: Attenuated secondary to medication (metoprololol 25 mg bid, last dose 15 hours ago. ) BP Response to Exercise: Normal resting BP- appropriate response. Chest Pain: none. Arrhythmias: none. Resting EKG demonstrates RBBB with ST-T changes in precordial peads related to RBBB Exercise EKG: Sinus tachycardia with RBBB with ST-T changes in precordial peads related to RBBB. No ischemic cahnges seen.  Overall Impression: Submaximal stress test with no ishcmic sympyoms or EKG changes Continue primary/secondary prevention. Consider alterante etiology for shortness of breath, or  further cardiac workup if pre test probability for CAD is high.  Lexiscan myoview stress test 09/03/2015: 1. Resting EKG demonstrated normal sinus rhythm, leftward axis, right bundle branch block.  Low-voltage complexes.  Stress EKG was nondiagnostic for ischemia as this is a pharmacologic stress test.  Patient attempt treadmill stress testing, however unable to complete stress test due to marked exercise intolerance. 2. The perfusion imaging study demonstrates mild gut uptake artifact in the inferior wall without any demonstrable ischemia or scar.  Left ventricular systolic function calculated by QGS was 60%. This is a low risk study.   Assessment & Recommendations:   1. Paroxysmal atrial fibrillation (HCC) CHA2DS2-VASc Score  is 2.  -(CHF; HTN; vasc disease DM,  Male = 1; Age <65 =0; 65-74 = 1,  >75 =2; stroke = 2).  -(  Yearly risk of stroke: Score of 1=1.3; 2=2.2; 3=3.2; 4=4; 5=6.7; 6=9.8; 7=>9.8)  EKG 03/22/2018: Sinus bradycardia at the rate of 52 bpm, borderline criteria for left atrial enlargement, leftward axis.  Right bundle branch block. No change from 08/04/17 and in 2018  08/27/2015 [facility]): EKG: Atrial fibrillation, with RVR, normal axis, right bundle branch block. Nonspecific ST-T abnormality.  2. Essential hypertension  3. Mild hyperlipidemia  4. Vasculogenic erectile dysfunction, unspecified vasculogenic erectile dysfunction type  Recommendation:  Patient presents for 6 month office visit and follow-up of paroxysmal atrial fibrillation, presently on flecainide 100 mg p.o. b.i.d.  He continues to have asymptomatic sinus bradycardia and change from 2017 through now.  A treadmill exercise stress test in September 2020 1920 with excellent exercise tolerance but chronotropic incompetence probably related to his antiarrhythmic drugs.  Blood pressure is well controlled, patient states that his lipids are also very well controlled.  He has responded well to Viagra with regard to erectile dysfunction.  No changes in the medications were done today.  I'll see him back in 6 months.    Adrian Prows, MD, [facility] 03/22/2018, 8:51 PM [facility] [place] Pager: [phone] Office: [phone] If no answer Cell [phone]

## 2018-03-22 ENCOUNTER — Ambulatory Visit: Payer: Medicare HMO | Admitting: Cardiology

## 2018-03-22 ENCOUNTER — Encounter: Payer: Self-pay | Admitting: Cardiology

## 2018-03-22 VITALS — BP 116/85 | HR 58 | Ht 71.0 in | Wt 223.0 lb

## 2018-03-22 DIAGNOSIS — N529 Male erectile dysfunction, unspecified: Secondary | ICD-10-CM | POA: Diagnosis not present

## 2018-03-22 DIAGNOSIS — I480 Paroxysmal atrial fibrillation: Secondary | ICD-10-CM | POA: Diagnosis not present

## 2018-03-22 DIAGNOSIS — E785 Hyperlipidemia, unspecified: Secondary | ICD-10-CM | POA: Diagnosis not present

## 2018-03-22 DIAGNOSIS — I10 Essential (primary) hypertension: Secondary | ICD-10-CM

## 2018-03-24 ENCOUNTER — Other Ambulatory Visit: Payer: Self-pay

## 2018-03-24 MED ORDER — LOSARTAN POTASSIUM-HCTZ 100-12.5 MG PO TABS: 1.0000 | ORAL_TABLET | Freq: Every day | ORAL | 2 refills | Status: DC

## 2018-03-30 DIAGNOSIS — R69 Illness, unspecified: Secondary | ICD-10-CM | POA: Diagnosis not present

## 2018-04-20 DIAGNOSIS — Z0100 Encounter for examination of eyes and vision without abnormal findings: Secondary | ICD-10-CM | POA: Diagnosis not present

## 2018-05-03 ENCOUNTER — Other Ambulatory Visit: Payer: Self-pay | Admitting: Cardiology

## 2018-05-03 DIAGNOSIS — I480 Paroxysmal atrial fibrillation: Secondary | ICD-10-CM

## 2018-05-03 MED ORDER — FLECAINIDE ACETATE 100 MG PO TABS: 100.0000 mg | ORAL_TABLET | Freq: Two times a day (BID) | ORAL | 1 refills | Status: DC

## 2018-05-05 DIAGNOSIS — I4891 Unspecified atrial fibrillation: Secondary | ICD-10-CM | POA: Diagnosis not present

## 2018-05-05 DIAGNOSIS — N401 Enlarged prostate with lower urinary tract symptoms: Secondary | ICD-10-CM | POA: Diagnosis not present

## 2018-05-05 DIAGNOSIS — I10 Essential (primary) hypertension: Secondary | ICD-10-CM | POA: Diagnosis not present

## 2018-05-05 DIAGNOSIS — M47812 Spondylosis without myelopathy or radiculopathy, cervical region: Secondary | ICD-10-CM | POA: Diagnosis not present

## 2018-05-05 DIAGNOSIS — E785 Hyperlipidemia, unspecified: Secondary | ICD-10-CM | POA: Diagnosis not present

## 2018-05-05 DIAGNOSIS — J42 Unspecified chronic bronchitis: Secondary | ICD-10-CM | POA: Diagnosis not present

## 2018-05-05 DIAGNOSIS — N400 Enlarged prostate without lower urinary tract symptoms: Secondary | ICD-10-CM | POA: Diagnosis not present

## 2018-05-31 DIAGNOSIS — J3089 Other allergic rhinitis: Secondary | ICD-10-CM | POA: Diagnosis not present

## 2018-05-31 DIAGNOSIS — R05 Cough: Secondary | ICD-10-CM | POA: Diagnosis not present

## 2018-05-31 DIAGNOSIS — J301 Allergic rhinitis due to pollen: Secondary | ICD-10-CM | POA: Diagnosis not present

## 2018-05-31 DIAGNOSIS — H1045 Other chronic allergic conjunctivitis: Secondary | ICD-10-CM | POA: Diagnosis not present

## 2018-06-02 DIAGNOSIS — R7309 Other abnormal glucose: Secondary | ICD-10-CM | POA: Diagnosis not present

## 2018-06-02 DIAGNOSIS — E785 Hyperlipidemia, unspecified: Secondary | ICD-10-CM | POA: Diagnosis not present

## 2018-06-22 DIAGNOSIS — J42 Unspecified chronic bronchitis: Secondary | ICD-10-CM | POA: Diagnosis not present

## 2018-06-22 DIAGNOSIS — J40 Bronchitis, not specified as acute or chronic: Secondary | ICD-10-CM | POA: Diagnosis not present

## 2018-06-22 DIAGNOSIS — E785 Hyperlipidemia, unspecified: Secondary | ICD-10-CM | POA: Diagnosis not present

## 2018-06-22 DIAGNOSIS — N401 Enlarged prostate with lower urinary tract symptoms: Secondary | ICD-10-CM | POA: Diagnosis not present

## 2018-06-22 DIAGNOSIS — M47812 Spondylosis without myelopathy or radiculopathy, cervical region: Secondary | ICD-10-CM | POA: Diagnosis not present

## 2018-06-22 DIAGNOSIS — I4891 Unspecified atrial fibrillation: Secondary | ICD-10-CM | POA: Diagnosis not present

## 2018-06-22 DIAGNOSIS — I10 Essential (primary) hypertension: Secondary | ICD-10-CM | POA: Diagnosis not present

## 2018-06-22 DIAGNOSIS — E7800 Pure hypercholesterolemia, unspecified: Secondary | ICD-10-CM | POA: Diagnosis not present

## 2018-06-22 DIAGNOSIS — N400 Enlarged prostate without lower urinary tract symptoms: Secondary | ICD-10-CM | POA: Diagnosis not present

## 2018-06-23 DIAGNOSIS — L821 Other seborrheic keratosis: Secondary | ICD-10-CM | POA: Diagnosis not present

## 2018-06-23 DIAGNOSIS — L820 Inflamed seborrheic keratosis: Secondary | ICD-10-CM | POA: Diagnosis not present

## 2018-06-23 DIAGNOSIS — L570 Actinic keratosis: Secondary | ICD-10-CM | POA: Diagnosis not present

## 2018-06-23 DIAGNOSIS — H61001 Unspecified perichondritis of right external ear: Secondary | ICD-10-CM | POA: Diagnosis not present

## 2018-06-23 DIAGNOSIS — L738 Other specified follicular disorders: Secondary | ICD-10-CM | POA: Diagnosis not present

## 2018-07-27 DIAGNOSIS — R69 Illness, unspecified: Secondary | ICD-10-CM | POA: Diagnosis not present

## 2018-08-27 DIAGNOSIS — R69 Illness, unspecified: Secondary | ICD-10-CM | POA: Diagnosis not present

## 2018-09-06 ENCOUNTER — Other Ambulatory Visit: Payer: Self-pay | Admitting: Cardiology

## 2018-09-06 DIAGNOSIS — I480 Paroxysmal atrial fibrillation: Secondary | ICD-10-CM

## 2018-09-06 NOTE — Telephone Encounter (Signed)
Please fill

## 2018-09-08 DIAGNOSIS — J42 Unspecified chronic bronchitis: Secondary | ICD-10-CM | POA: Diagnosis not present

## 2018-09-08 DIAGNOSIS — E785 Hyperlipidemia, unspecified: Secondary | ICD-10-CM | POA: Diagnosis not present

## 2018-09-08 DIAGNOSIS — I4891 Unspecified atrial fibrillation: Secondary | ICD-10-CM | POA: Diagnosis not present

## 2018-09-08 DIAGNOSIS — E7800 Pure hypercholesterolemia, unspecified: Secondary | ICD-10-CM | POA: Diagnosis not present

## 2018-09-08 DIAGNOSIS — N400 Enlarged prostate without lower urinary tract symptoms: Secondary | ICD-10-CM | POA: Diagnosis not present

## 2018-09-08 DIAGNOSIS — J40 Bronchitis, not specified as acute or chronic: Secondary | ICD-10-CM | POA: Diagnosis not present

## 2018-09-08 DIAGNOSIS — N401 Enlarged prostate with lower urinary tract symptoms: Secondary | ICD-10-CM | POA: Diagnosis not present

## 2018-09-08 DIAGNOSIS — M47812 Spondylosis without myelopathy or radiculopathy, cervical region: Secondary | ICD-10-CM | POA: Diagnosis not present

## 2018-09-08 DIAGNOSIS — I10 Essential (primary) hypertension: Secondary | ICD-10-CM | POA: Diagnosis not present

## 2018-09-16 DIAGNOSIS — L820 Inflamed seborrheic keratosis: Secondary | ICD-10-CM | POA: Diagnosis not present

## 2018-09-16 DIAGNOSIS — L821 Other seborrheic keratosis: Secondary | ICD-10-CM | POA: Diagnosis not present

## 2018-09-16 DIAGNOSIS — L570 Actinic keratosis: Secondary | ICD-10-CM | POA: Diagnosis not present

## 2018-09-19 ENCOUNTER — Other Ambulatory Visit: Payer: Self-pay | Admitting: Cardiology

## 2018-09-19 DIAGNOSIS — E785 Hyperlipidemia, unspecified: Secondary | ICD-10-CM

## 2018-10-06 ENCOUNTER — Ambulatory Visit: Payer: Medicare HMO | Admitting: Cardiology

## 2018-10-06 ENCOUNTER — Other Ambulatory Visit: Payer: Self-pay

## 2018-10-06 ENCOUNTER — Encounter: Payer: Self-pay | Admitting: Cardiology

## 2018-10-06 VITALS — BP 127/85 | HR 58 | Temp 97.8°F | Ht 71.0 in | Wt 224.8 lb

## 2018-10-06 DIAGNOSIS — I10 Essential (primary) hypertension: Secondary | ICD-10-CM | POA: Diagnosis not present

## 2018-10-06 DIAGNOSIS — I480 Paroxysmal atrial fibrillation: Secondary | ICD-10-CM

## 2018-10-06 NOTE — Progress Notes (Signed)
Primary Physician/Referring:  Josetta Huddle, MD  Patient ID: Christian [name], male    DOB: [birth year], 73 y.o.   MRN: [id number]  No chief complaint on file.  HPI:    Christian [name]  is a 73 y.o. Caucasian male with paroxysmal atrial fibrillation first episode on 08/27/2015, history of bilateral venous insufficiency S/P ablation left leg in 2019, hypertension presents for 72-month office visit and follow-up. His main complaint is recurrence of right leg varicose veins. He has swelling and discomfort from it and is wondering if he should have ablation again.   Patient is presently doing well and denies any recent palpitations or chest pain or dyspnea.  He is presently on flecainide and is tolerating this well, post flecainide initiation treadmill stress test was negative for any inducible arrhythmia.  He has chronic underlying bradycardia but remains asymptomatic and had excellent exercise tolerance by stress test in 2019.  He has had a negative nuclear stress test in 2017.  Past Medical History:  Diagnosis Date  . Arthritis   . BPH (benign prostatic hypertrophy)   . Congenital posterior urethral valve   . DDD (degenerative disc disease), cervical   . DDD (degenerative disc disease), lumbar   . Factor V deficiency ([place])   . Family history of atrial fibrillation    His brother, sister have A. fib, suspects his father also had A. fib  . Frequency of urination   . H/O bundle branch block   . History of gastroesophageal reflux (GERD)   . Hypertension   . Nocturia   . Shortness of breath dyspnea    Past Surgical History:  Procedure Laterality Date  . CATARACT EXTRACTION W/ INTRAOCULAR LENS  IMPLANT, BILATERAL  3 yrs ago  . COLONOSCOPY N/A 06/27/2014   Procedure: COLONOSCOPY;  Surgeon: Garlan Fair, MD;  Location: [facility];  Service: Endoscopy;  Laterality: N/A;  . ENDOVENOUS ABLATION SAPHENOUS VEIN W/ LASER Right 06/30/2016   EVLA R GSV by Tinnie Gens MD   . ENDOVENOUS ABLATION  SAPHENOUS VEIN W/ LASER Left 07/14/2016   endovenous laser ablation Left greater saphenous vein by Tinnie Gens MD   . NO PAST SURGERIES    . REPAIR CONGENITAL URETHRAL DEFECT  INFANT  . stab phlebectomy Left 11/24/2016   stab phlebectomy > 20 incisions left leg by Tinnie Gens MD   . stab phlebectomy  Right `12-29-2016   stab phlebectomy > 20 incisions right leg by Tinnie Gens MD   . 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 Marital status: Married    Spouse name: Not on file  . Number of children: Not on file  . Years of education: Not on file  . Highest education level: Not on file  Occupational History  . Not on file  Social Needs  . Financial resource strain: Not on file  . Food insecurity    Worry: Not on file    Inability: Not on file  . Transportation needs    Medical: Not on file    Non-medical: Not on file  Tobacco Use  . Smoking status: Former Smoker    Packs/day: 1.00    Years: 20.00    Pack years: 20.00    Types: Cigarettes  Quit date: 02/28/1995    Years since quitting: 23.6  . Smokeless tobacco: Never Used  Substance and Sexual Activity  . Alcohol use: Yes    Alcohol/week: 10.0 standard drinks    Types: 10 Shots of liquor per week  . Drug use: No  . Sexual activity: Not on file  Lifestyle  . Physical activity    Days per week: Not on file    Minutes per session: Not on file  . Stress: Not on file  Relationships  . Social Herbalist on phone: Not on file    Gets together: Not on file    Attends religious service: Not on file    Active member of club or organization: Not on file    Attends meetings of clubs or organizations: Not on file    Relationship status: Not on file  .  Intimate partner violence    Fear of current or ex partner: Not on file    Emotionally abused: Not on file    Physically abused: Not on file    Forced sexual activity: Not on file  Other Topics Concern  . Not on file  Social History Narrative  . Not on file   ROS  Review of Systems  Constitution: Negative for chills, decreased appetite, malaise/fatigue and weight gain.  Cardiovascular: Positive for leg swelling (mild due to varicose veins). Negative for dyspnea on exertion and syncope.  Endocrine: Negative for cold intolerance.  Hematologic/Lymphatic: Does not bruise/bleed easily.  Musculoskeletal: Negative for joint swelling.  Gastrointestinal: Negative for abdominal pain, anorexia, change in bowel habit, hematochezia and melena.  Neurological: Negative for headaches and light-headedness.  Psychiatric/Behavioral: Negative for depression and substance abuse.  All other systems reviewed and are negative.  Objective   Vitals with BMI 03/22/2018 08/04/2017 03/30/2017  Height 5\' 11"  5\' 11"  5\' 11"   Weight 223 lbs 225 lbs 3 oz 222 lbs  BMI 31.12 [id number] [id number]  Systolic [id number]  Diastolic 85 79 76  Pulse 58 50 58    There were no vitals taken for this visit. There is no height or weight on file to calculate BMI.   Physical Exam  Constitutional: He appears well-developed. No distress.  Mildly obese  HENT:  Head: Atraumatic.  Eyes: Conjunctivae are normal.  Neck: Neck supple. No JVD present. No thyromegaly present.  Cardiovascular: Normal rate, regular rhythm, normal heart sounds and intact distal pulses. Exam reveals no gallop.  No murmur heard. Bilateral lower extremity varicose veins noted right worse.  Pulmonary/Chest: Effort normal and breath sounds normal.  Abdominal: Soft. Bowel sounds are normal.  Musculoskeletal: Normal range of motion.        General: No edema.  Neurological: He is alert.  Skin: Skin is warm and dry.  Psychiatric: He has a normal mood and affect.    Radiology: No results found.  Laboratory examination:   No results for input(s): NA, K, CL, CO2, GLUCOSE, BUN, CREATININE, CALCIUM, GFRNONAA, GFRAA in the last 8760 hours. CMP Latest Ref Rng & Units 08/27/2015 03/10/2011  Glucose 65 - 99 mg/dL 99 102(H)  BUN 6 - 20 mg/dL 20 -  Creatinine 0.61 - 1.24 mg/dL 1.17 -  Sodium 135 - 145 mmol/L 139 144  Potassium 3.5 - 5.1 mmol/L 4.0 3.5  Chloride 101 - 111 mmol/L 108 -  CO2 22 - 32 mmol/L 22 -  Calcium 8.9 - 10.3 mg/dL 9.3 -   CBC Latest Ref Rng & Units 08/27/2015 03/10/2011  WBC 4.0 - 10.5 K/uL 8.3 -  Hemoglobin 13.0 - 17.0 g/dL 14.4 16.3  Hematocrit 39.0 - 52.0 % 43.4 48.0  Platelets 150 - 400 K/uL 278 -   Lipid Panel     Component Value Date/Time   CHOL 148 08/28/2015 0048   TRIG 157 (H) 08/28/2015 0048   HDL 45 08/28/2015 0048   CHOLHDL 3.3 08/28/2015 0048   VLDL 31 08/28/2015 0048   LDLCALC 72 08/28/2015 0048   HEMOGLOBIN A1C Lab Results  Component Value Date   HGBA1C 5.9 (H) 08/27/2015   MPG 123 08/27/2015   TSH No results for input(s): TSH in the last 8760 hours. Medications   Allergies  Allergen Reactions  . Gin [Alcohol] Other (See Comments)    Pins and needles feeling  . Adhesive [Tape] Other (See Comments)    Redness/ irritation  . Lobster [Shellfish Allergy] Other (See Comments)    Allergy test     Prior to Admission medications   Medication Sig Start Date End Date Taking? Authorizing Provider  acetaminophen (TYLENOL) 500 MG tablet Take 1,000 mg by mouth every 6 (six) hours as needed.    [provider]  atorvastatin (LIPITOR) 10 MG tablet TAKE 1 TABLET BY MOUTH EVERY DAY 09/21/18   Adrian Prows, MD  Cholecalciferol (D-3-5) 5000 UNITS capsule Take 5,000 Units by mouth daily.    [provider]  Coenzyme Q10 (COQ10) 200 MG CAPS Take 300 mg by mouth daily.     [provider]  fexofenadine (ALLEGRA) 180 MG tablet Take 180 mg by mouth daily.    [provider]  flecainide  (TAMBOCOR) 100 MG tablet TAKE 1 TABLET BY MOUTH TWICE A DAY 09/06/18   Miquel Dunn, NP  fluticasone [facility]) 50 MCG/ACT nasal spray Place 1-2 sprays into both nostrils daily as needed for allergies.     [provider]  levocetirizine (XYZAL) 5 MG tablet  03/03/16   [provider]  losartan-hydrochlorothiazide (HYZAAR) 100-12.5 MG tablet Take 1 tablet by mouth daily. 03/24/18   Adrian Prows, MD  methocarbamol (ROBAXIN) 500 MG tablet every 8 (eight) hours as needed.  01/16/16   [provider]  metoprolol tartrate (LOPRESSOR) 25 MG tablet Take 1 tablet (25 mg total) by mouth 3 (three) times daily. Patient taking differently: Take 25 mg by mouth 2 (two) times daily.  08/28/15   Adrian Prows, MD  montelukast (SINGULAIR) 10 MG tablet Take 10 mg by mouth at bedtime. 08/16/15   [provider]  Multiple Vitamins-Minerals (CENTRUM SILVER PO) Take 1 tablet by mouth daily.    [provider]  omeprazole (PRILOSEC) 20 MG capsule Take 20 mg by mouth daily. TAKES PRILOSEC WITH DICLOFENAC    [provider]  PROAIR RESPICLICK 123XX123 (90 Base) MCG/ACT AEPB Inhale 1 puff into the lungs daily. 07/24/15   [provider]  Probiotic Product (PROBIOTIC ADVANCED PO) Take by mouth daily.    [provider]  rivaroxaban (XARELTO) 20 MG TABS tablet Take 1 tablet (20 mg total) by mouth daily after supper. 08/28/15   Adrian Prows, MD  sildenafil (VIAGRA) 50 MG tablet Take 50 mg by mouth daily as needed for erectile dysfunction.    [provider]  [facility] 160-4.5 MCG/ACT inhaler  02/21/17   [provider]  tamsulosin (FLOMAX) 0.4 MG CAPS capsule Take 0.4 mg by mouth.    [provider]  traMADol (ULTRAM) 50 MG tablet Take 1 tablet (50 mg total) by mouth every 6 (  six) hours as needed for moderate pain. Patient taking differently: Take 50 mg by mouth daily.  08/28/15   Adrian Prows, MD  zolpidem (AMBIEN) 5 MG tablet Take 5 mg by mouth  at bedtime as needed for sleep.     [provider]     Current Outpatient Medications  Medication Instructions  . acetaminophen (TYLENOL) 1,000 mg, Oral, Every 6 hours PRN  . atorvastatin (LIPITOR) 10 MG tablet TAKE 1 TABLET BY MOUTH EVERY DAY  . Cholecalciferol (D-3-5) 5,000 Units, Oral, Daily  . CoQ10 300 mg, Oral, Daily  . fexofenadine (ALLEGRA) 180 mg, Oral, Daily  . flecainide (TAMBOCOR) 100 MG tablet TAKE 1 TABLET BY MOUTH TWICE A DAY  . fluticasone (FLONASE) 50 MCG/ACT nasal spray 1-2 sprays, Each Nare, Daily PRN  . levocetirizine (XYZAL) 5 MG tablet No dose, route, or frequency recorded.  Marland Kitchen losartan-hydrochlorothiazide (HYZAAR) 100-12.5 MG tablet 1 tablet, Oral, Daily  . methocarbamol (ROBAXIN) 500 MG tablet Every 8 hours PRN  . metoprolol tartrate (LOPRESSOR) 25 mg, Oral, 3 times daily  . montelukast (SINGULAIR) 10 mg, Oral, Daily at bedtime  . Multiple Vitamins-Minerals (CENTRUM SILVER PO) 1 tablet, Oral, Daily  . omeprazole (PRILOSEC) 20 mg, Oral, Daily, TAKES PRILOSEC WITH DICLOFENAC  . PROAIR RESPICLICK 123XX123 (90 Base) MCG/ACT AEPB 1 puff, Inhalation, Daily  . Probiotic Product (PROBIOTIC ADVANCED PO) Oral, Daily  . rivaroxaban (XARELTO) 20 mg, Oral, Daily after supper  . sildenafil (VIAGRA) 50 mg, Oral, Daily PRN  . SYMBICORT 160-4.5 MCG/ACT inhaler No dose, route, or frequency recorded.  . tamsulosin (FLOMAX) 0.4 mg, Oral  . traMADol (ULTRAM) 50 mg, Oral, Every 6 hours PRN  . zolpidem (AMBIEN) 5 mg, Oral, At bedtime PRN    Cardiac Studies:   Echocardiogram 09/05/2015: Left ventricle cavity is normal in size. Mild concentric hypertrophy of the left ventricle. Normal global wall motion. Normal diastolic filling pattern. Calculated EF 55%. Left atrial cavity is moderately dilated at 4.5 cm. Trace mitral regurgitation. Mild tricuspid regurgitation. No evidence of pulmonary hypertension.  Lexiscan myoview stress test 09/03/2015: 1. Resting EKG demonstrated  normal sinus rhythm, leftward axis, right bundle branch block.  Low-voltage complexes.  Stress EKG was nondiagnostic for ischemia as this is a pharmacologic stress test.  Patient attempt treadmill stress testing, however unable to complete stress test due to marked exercise intolerance. 2. The perfusion imaging study demonstrates mild gut uptake artifact in the inferior wall without any demonstrable ischemia or scar.  Left ventricular systolic function calculated by QGS was 60%. This is a low risk study.   Exercise Treadmill Stress Test 08/31/2017:  Indication: SoB  The patient exercised on Bruce protocol for  10:05 min. Patient achieved  10.16 METS and reached HR  124 bpm, which is   82% of maximum age-predicted HR.  Stress test terminated due to Fatigue.   Exercise capacity was normal. HR Response to Exercise: Attenuated secondary to medication (metoprololol 25 mg bid, last dose 15 hours ago. ) BP Response to Exercise: Normal resting BP- appropriate response. Chest Pain: none. Arrhythmias: none. Resting EKG demonstrates RBBB with ST-T changes in precordial peads related to RBBB Exercise EKG: Sinus tachycardia with RBBB with ST-T changes in precordial peads related to RBBB. No ischemic cahnges seen.  Overall Impression: Submaximal stress test with no ishcmic sympyoms or EKG changes Continue primary/secondary prevention. Consider alterante etiology for shortness of breath, or  further cardiac workup if pre test probability for CAD is high.  Assessment     ICD-10-CM  1. Paroxysmal atrial fibrillation (HCC)  I48.0    CHA2DS2-VASc Score is 2. Yearly risk of stroke: 2.3%  2. Essential hypertension  I10     EKG 10/06/2018: Sinus bradycardia at the rate of 53 bpm, left atrial enlargement, leftward axis.  Right bundle branch block.  No evidence of ischemia. No significant change from  EKG 03/22/2018.   Recommendations:   Patient presents for 6 month office visit and follow-up of paroxysmal  atrial fibrillation, presently on flecainide 100 mg p.o. b.i.d.  He continues to have asymptomatic sinus bradycardia and change from 2017 through now.    A treadmill exercise stress test in September 2020 1920 with excellent exercise tolerance but chronotropic incompetence probably related to his antiarrhythmic drugs.  Blood pressure is well controlled, I do not have his recent labs,, but patient states renal function and blood count normal.  With regard to his varicose veins involving the right leg worse than the left, she could continue to wear support stockings, but he is also on anticoagulants, there is certainly bleeding risk from ruptured varicose veins.  He may consider reevaluation by [facility] at some point. Otherwise stable from cardiac standpoint I'll see him back in a year.  Adrian Prows, MD, [facility] 10/06/2018, 10:51 AM [facility]. [place] Pager: [phone] Office: [phone] If no answer Cell [phone]

## 2018-10-08 DIAGNOSIS — I4891 Unspecified atrial fibrillation: Secondary | ICD-10-CM | POA: Diagnosis not present

## 2018-10-08 DIAGNOSIS — N400 Enlarged prostate without lower urinary tract symptoms: Secondary | ICD-10-CM | POA: Diagnosis not present

## 2018-10-08 DIAGNOSIS — J42 Unspecified chronic bronchitis: Secondary | ICD-10-CM | POA: Diagnosis not present

## 2018-10-08 DIAGNOSIS — I10 Essential (primary) hypertension: Secondary | ICD-10-CM | POA: Diagnosis not present

## 2018-10-08 DIAGNOSIS — M47812 Spondylosis without myelopathy or radiculopathy, cervical region: Secondary | ICD-10-CM | POA: Diagnosis not present

## 2018-10-08 DIAGNOSIS — N401 Enlarged prostate with lower urinary tract symptoms: Secondary | ICD-10-CM | POA: Diagnosis not present

## 2018-10-08 DIAGNOSIS — E785 Hyperlipidemia, unspecified: Secondary | ICD-10-CM | POA: Diagnosis not present

## 2018-10-08 DIAGNOSIS — J40 Bronchitis, not specified as acute or chronic: Secondary | ICD-10-CM | POA: Diagnosis not present

## 2018-10-08 DIAGNOSIS — E7800 Pure hypercholesterolemia, unspecified: Secondary | ICD-10-CM | POA: Diagnosis not present

## 2018-11-10 DIAGNOSIS — E785 Hyperlipidemia, unspecified: Secondary | ICD-10-CM | POA: Diagnosis not present

## 2018-11-10 DIAGNOSIS — J42 Unspecified chronic bronchitis: Secondary | ICD-10-CM | POA: Diagnosis not present

## 2018-11-10 DIAGNOSIS — J40 Bronchitis, not specified as acute or chronic: Secondary | ICD-10-CM | POA: Diagnosis not present

## 2018-11-10 DIAGNOSIS — E7800 Pure hypercholesterolemia, unspecified: Secondary | ICD-10-CM | POA: Diagnosis not present

## 2018-11-10 DIAGNOSIS — N400 Enlarged prostate without lower urinary tract symptoms: Secondary | ICD-10-CM | POA: Diagnosis not present

## 2018-11-10 DIAGNOSIS — N401 Enlarged prostate with lower urinary tract symptoms: Secondary | ICD-10-CM | POA: Diagnosis not present

## 2018-11-10 DIAGNOSIS — M47812 Spondylosis without myelopathy or radiculopathy, cervical region: Secondary | ICD-10-CM | POA: Diagnosis not present

## 2018-11-10 DIAGNOSIS — I4891 Unspecified atrial fibrillation: Secondary | ICD-10-CM | POA: Diagnosis not present

## 2018-11-10 DIAGNOSIS — I10 Essential (primary) hypertension: Secondary | ICD-10-CM | POA: Diagnosis not present

## 2018-11-23 DIAGNOSIS — R69 Illness, unspecified: Secondary | ICD-10-CM | POA: Diagnosis not present

## 2018-11-24 DIAGNOSIS — N401 Enlarged prostate with lower urinary tract symptoms: Secondary | ICD-10-CM | POA: Diagnosis not present

## 2018-11-24 DIAGNOSIS — M47812 Spondylosis without myelopathy or radiculopathy, cervical region: Secondary | ICD-10-CM | POA: Diagnosis not present

## 2018-11-24 DIAGNOSIS — Z1211 Encounter for screening for malignant neoplasm of colon: Secondary | ICD-10-CM | POA: Diagnosis not present

## 2018-11-24 DIAGNOSIS — Z0000 Encounter for general adult medical examination without abnormal findings: Secondary | ICD-10-CM | POA: Diagnosis not present

## 2018-11-24 DIAGNOSIS — Z23 Encounter for immunization: Secondary | ICD-10-CM | POA: Diagnosis not present

## 2018-11-24 DIAGNOSIS — E7800 Pure hypercholesterolemia, unspecified: Secondary | ICD-10-CM | POA: Diagnosis not present

## 2018-11-24 DIAGNOSIS — I4891 Unspecified atrial fibrillation: Secondary | ICD-10-CM | POA: Diagnosis not present

## 2018-11-24 DIAGNOSIS — Z125 Encounter for screening for malignant neoplasm of prostate: Secondary | ICD-10-CM | POA: Diagnosis not present

## 2018-11-24 DIAGNOSIS — J45909 Unspecified asthma, uncomplicated: Secondary | ICD-10-CM | POA: Diagnosis not present

## 2018-11-24 DIAGNOSIS — Z1389 Encounter for screening for other disorder: Secondary | ICD-10-CM | POA: Diagnosis not present

## 2018-11-24 DIAGNOSIS — G4700 Insomnia, unspecified: Secondary | ICD-10-CM | POA: Diagnosis not present

## 2018-11-24 DIAGNOSIS — K219 Gastro-esophageal reflux disease without esophagitis: Secondary | ICD-10-CM | POA: Diagnosis not present

## 2018-11-24 DIAGNOSIS — I10 Essential (primary) hypertension: Secondary | ICD-10-CM | POA: Diagnosis not present

## 2018-12-02 DIAGNOSIS — L820 Inflamed seborrheic keratosis: Secondary | ICD-10-CM | POA: Diagnosis not present

## 2018-12-02 DIAGNOSIS — L720 Epidermal cyst: Secondary | ICD-10-CM | POA: Diagnosis not present

## 2018-12-02 DIAGNOSIS — L570 Actinic keratosis: Secondary | ICD-10-CM | POA: Diagnosis not present

## 2018-12-02 DIAGNOSIS — Z23 Encounter for immunization: Secondary | ICD-10-CM | POA: Diagnosis not present

## 2018-12-02 DIAGNOSIS — L723 Sebaceous cyst: Secondary | ICD-10-CM | POA: Diagnosis not present

## 2018-12-02 DIAGNOSIS — D2272 Melanocytic nevi of left lower limb, including hip: Secondary | ICD-10-CM | POA: Diagnosis not present

## 2018-12-02 DIAGNOSIS — D1722 Benign lipomatous neoplasm of skin and subcutaneous tissue of left arm: Secondary | ICD-10-CM | POA: Diagnosis not present

## 2018-12-02 DIAGNOSIS — D485 Neoplasm of uncertain behavior of skin: Secondary | ICD-10-CM | POA: Diagnosis not present

## 2018-12-02 DIAGNOSIS — L821 Other seborrheic keratosis: Secondary | ICD-10-CM | POA: Diagnosis not present

## 2018-12-13 DIAGNOSIS — D3131 Benign neoplasm of right choroid: Secondary | ICD-10-CM | POA: Diagnosis not present

## 2018-12-13 DIAGNOSIS — H02831 Dermatochalasis of right upper eyelid: Secondary | ICD-10-CM | POA: Diagnosis not present

## 2018-12-13 DIAGNOSIS — Z961 Presence of intraocular lens: Secondary | ICD-10-CM | POA: Diagnosis not present

## 2018-12-20 ENCOUNTER — Other Ambulatory Visit: Payer: Self-pay

## 2018-12-20 DIAGNOSIS — I83891 Varicose veins of right lower extremities with other complications: Secondary | ICD-10-CM

## 2018-12-21 ENCOUNTER — Other Ambulatory Visit: Payer: Self-pay

## 2018-12-21 ENCOUNTER — Telehealth (HOSPITAL_COMMUNITY): Payer: Self-pay

## 2018-12-21 ENCOUNTER — Ambulatory Visit (HOSPITAL_COMMUNITY)
Admission: RE | Admit: 2018-12-21 | Discharge: 2018-12-21 | Disposition: A | Discharge status: Home / Self Care | Payer: Medicare HMO | Source: Ambulatory Visit | Attending: Family | Admitting: Family

## 2018-12-21 DIAGNOSIS — I83891 Varicose veins of right lower extremities with other complications: Secondary | ICD-10-CM | POA: Diagnosis not present

## 2018-12-21 NOTE — Telephone Encounter (Signed)

## 2018-12-22 ENCOUNTER — Ambulatory Visit: Payer: Medicare HMO | Admitting: Vascular Surgery

## 2018-12-22 ENCOUNTER — Encounter: Payer: Self-pay | Admitting: Vascular Surgery

## 2018-12-22 VITALS — BP 129/89 | HR 56 | Resp 16 | Ht 71.0 in | Wt 226.0 lb

## 2018-12-22 DIAGNOSIS — I83811 Varicose veins of right lower extremities with pain: Secondary | ICD-10-CM | POA: Diagnosis not present

## 2018-12-22 DIAGNOSIS — I83893 Varicose veins of bilateral lower extremities with other complications: Secondary | ICD-10-CM

## 2018-12-22 NOTE — Progress Notes (Signed)
Patient name: Christian [name] MRN: [id number] DOB: 13-Jun-1945 Sex: male  REASON FOR CONSULT: Symptomatic varicose veins right leg with pain and swelling  HPI: Christian [name] is a 73 y.o. male, with a several year history of pain and swelling from varicose veins.  He previously underwent bilateral greater saphenous vein laser ablation and bilateral stab avulsions by my partner Dr. Kellie Simmering in 2018.  About 6 months ago he noticed that he was having a very large recurrent varicose vein behind his right knee.  He experiences pain when things brushed across this.  He started wearing his compression stockings again about 3 weeks ago.  He does not really have much of a complaint in the left leg.  He developed swelling around his right ankle during the course of the day.  This is improved with elevation and rest.  He does have some fullness heaviness and aching in the right leg occasionally.  Other medical problems include atrial fibrillation for which he is on Xarelto.  He also has a history of factor V deficiency and a family history of this.  He does not have any history of DVT.  He has had several prior superficial venous thrombosis episodes in the past but none recently.  Past Medical History:  Diagnosis Date  . Arthritis   . BPH (benign prostatic hypertrophy)   . Congenital posterior urethral valve   . DDD (degenerative disc disease), cervical   . DDD (degenerative disc disease), lumbar   . Factor V deficiency ([place])   . Family history of atrial fibrillation    His brother, sister have A. fib, suspects his father also had A. fib  . Frequency of urination   . H/O bundle branch block   . History of gastroesophageal reflux (GERD)   . Hypertension   . Nocturia   . Shortness of breath dyspnea    Past Surgical History:  Procedure Laterality Date  . CATARACT EXTRACTION W/ INTRAOCULAR LENS  IMPLANT, BILATERAL  3 yrs ago  . COLONOSCOPY N/A 06/27/2014   Procedure: COLONOSCOPY;  Surgeon: Garlan Fair,  MD;  Location: [facility];  Service: Endoscopy;  Laterality: N/A;  . ENDOVENOUS ABLATION SAPHENOUS VEIN W/ LASER Right 06/30/2016   EVLA R GSV by Tinnie Gens MD   . ENDOVENOUS ABLATION SAPHENOUS VEIN W/ LASER Left 07/14/2016   endovenous laser ablation Left greater saphenous vein by Tinnie Gens MD   . NO PAST SURGERIES    . REPAIR CONGENITAL URETHRAL DEFECT  INFANT  . stab phlebectomy Left 11/24/2016   stab phlebectomy > 20 incisions left leg by Tinnie Gens MD   . stab phlebectomy  Right `12-29-2016   stab phlebectomy > 20 incisions right leg by Tinnie Gens MD   . 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 Atrial fibrillation Father   . Atrial fibrillation Sister   . Factor V Leiden deficiency Sister   . Atrial fibrillation Brother   . Factor V Leiden deficiency Brother   . Hyperlipidemia Mother     SOCIAL HISTORY: Social History   Socioeconomic History  . Marital status: Married    Spouse name: Not on file  . Number of children: 2  . Years of education: Not on file  .  Highest education level: Not on file  Occupational History  . Not on file  Social Needs  . Financial resource strain: Not on file  . Food insecurity    Worry: Not on file    Inability: Not on file  . Transportation needs    Medical: Not on file    Non-medical: Not on file  Tobacco Use  . Smoking status: Former Smoker    Packs/day: 1.00    Years: 20.00    Pack years: 20.00    Types: Cigarettes    Quit date: 02/28/1995    Years since quitting: 23.8  . Smokeless tobacco: Never Used  Substance and Sexual Activity  . Alcohol use: Yes    Alcohol/week: 10.0 standard drinks    Types: 10 Shots of liquor per week  . Drug use: No  . Sexual  activity: Not on file  Lifestyle  . Physical activity    Days per week: Not on file    Minutes per session: Not on file  . Stress: Not on file  Relationships  . Social Herbalist on phone: Not on file    Gets together: Not on file    Attends religious service: Not on file    Active member of club or organization: Not on file    Attends meetings of clubs or organizations: Not on file    Relationship status: Not on file  . Intimate partner violence    Fear of current or ex partner: Not on file    Emotionally abused: Not on file    Physically abused: Not on file    Forced sexual activity: Not on file  Other Topics Concern  . Not on file  Social History Narrative  . Not on file    Allergies  Allergen Reactions  . Gin [Alcohol] Other (See Comments)    Pins and needles feeling  . Adhesive [Tape] Other (See Comments)    Redness/ irritation  . Lobster [Shellfish Allergy] Other (See Comments)    Allergy test    Current Outpatient Medications  Medication Sig Dispense Refill  . acetaminophen (TYLENOL) 500 MG tablet Take 1,000 mg by mouth every 6 (six) hours as needed.    Marland Kitchen atorvastatin (LIPITOR) 10 MG tablet TAKE 1 TABLET BY MOUTH EVERY DAY 90 tablet 2  . Cholecalciferol (D-3-5) 5000 UNITS capsule Take 5,000 Units by mouth daily.    . Coenzyme Q10 (COQ10) 200 MG CAPS Take 300 mg by mouth daily.     . finasteride (PROSCAR) 5 MG tablet Take 5 mg by mouth daily.    . flecainide (TAMBOCOR) 100 MG tablet TAKE 1 TABLET BY MOUTH TWICE A DAY 180 tablet 1  . fluticasone (FLONASE) 50 MCG/ACT nasal spray Place 1-2 sprays into both nostrils daily as needed for allergies.     Marland Kitchen levocetirizine (XYZAL) 5 MG tablet     . losartan-hydrochlorothiazide (HYZAAR) 100-12.5 MG tablet Take 1 tablet by mouth daily. 90 tablet 2  . metoprolol tartrate (LOPRESSOR) 25 MG tablet Take 1 tablet (25 mg total) by mouth 3 (three) times daily. (Patient taking differently: Take 25 mg by mouth 2 (two) times  daily. ) 90 tablet 1  . montelukast (SINGULAIR) 10 MG tablet Take 10 mg by mouth at bedtime.  3  . Multiple Vitamins-Minerals (CENTRUM SILVER PO) Take 1 tablet by mouth daily.    Marland Kitchen omeprazole (PRILOSEC) 20 MG capsule Take 20 mg by mouth daily. TAKES PRILOSEC WITH DICLOFENAC    .  PROAIR RESPICLICK 123XX123 (90 Base) MCG/ACT AEPB Inhale 1 puff into the lungs daily.  0  . rivaroxaban (XARELTO) 20 MG TABS tablet Take 1 tablet (20 mg total) by mouth daily after supper. 30 tablet 2  . sildenafil (VIAGRA) 50 MG tablet Take 50 mg by mouth daily as needed for erectile dysfunction.    . SYMBICORT 160-4.5 MCG/ACT inhaler     . tamsulosin (FLOMAX) 0.4 MG CAPS capsule Take 0.4 mg by mouth.    . traMADol (ULTRAM) 50 MG tablet Take 1 tablet (50 mg total) by mouth every 6 (six) hours as needed for moderate pain. (Patient taking differently: Take 50 mg by mouth daily. ) 60 tablet 1  . zolpidem (AMBIEN) 5 MG tablet Take 5 mg by mouth at bedtime as needed for sleep.      No current facility-administered medications for this visit.     ROS:   General:  No weight loss, Fever, chills  HEENT: No recent headaches, no nasal bleeding, no visual changes, no sore throat  Neurologic: No dizziness, blackouts, seizures. No recent symptoms of stroke or mini- stroke. No recent episodes of slurred speech, or temporary blindness.  Cardiac: No recent episodes of chest pain/pressure, no shortness of breath at rest.  No shortness of breath with exertion.  Denies history of atrial fibrillation or irregular heartbeat  Vascular: No history of rest pain in feet.  No history of claudication.  No history of non-healing ulcer, No history of DVT   Pulmonary: No home oxygen, no productive cough, no hemoptysis,  No asthma or wheezing  Musculoskeletal:  [ ]  Arthritis, [ ]  Low back pain,  [ ]  Joint pain  Hematologic:No history of hypercoagulable state.  No history of easy bleeding.  No history of anemia  Gastrointestinal: No hematochezia  or melena,  No gastroesophageal reflux, no trouble swallowing  Urinary: [ ]  chronic Kidney disease, [ ]  on HD - [ ]  MWF or [ ]  TTHS, [ ]  Burning with urination, [ ]  Frequent urination, [ ]  Difficulty urinating;   Skin: No rashes  Psychological: No history of anxiety,  No history of depression   Physical Examination  Vitals:   12/22/18 0829  BP: 129/89  Pulse: (!) 56  Resp: 16  SpO2: 98%  Weight: 226 lb (102.5 kg)  Height: 5\' 11"  (1.803 m)    Body mass index is 31.52 kg/m.  General:  Alert and oriented, no acute distress HEENT: Normal Neck: No bruit or JVD Cardiac: Regular Rate and Rhythm  Skin: No rash, recent skin punch biopsy site right posterior lateral calf healing large varicosity right posterior knee area with some 3 to 4 mm varicosities scattered across the anterior aspect of the right leg Extremity Pulses:  2+ radial, brachial, femoral, 1+ dorsalis pedis, 2+ posterior tibial pulses bilaterally Musculoskeletal: No deformity mild pretibial edema bilaterally  Neurologic: Upper and lower extremity motor 5/5 and symmetric        DATA:  Patient underwent a venous duplex for reflux a few days ago.  This showed good closure of the saphenous vein bilaterally.  There was some mild reflux in the lesser saphenous vein but the vein diameter was of less than 3 mm.  There was a large varicose vein originating off of the popliteal vein which accounts for the vein the patient sees externally.  There was mild deep vein reflux.  A right leg study was done.  ASSESSMENT: Symptomatic varicose veins with pain right leg.  Varicosities are recurrent despite prior  laser ablation.  Primary area of concern is posterior to the right knee.   PLAN: Patient was given a prescription today for bilateral lower extremity compression stockings to improve symptoms.  I did discuss with him today the possibility of doing recurrent stab avulsions in the right leg to improve his symptoms.  At this point  he is opted for conservative therapy with compression therapy alone.  He will call us at some point in future if he wishes to pursue possible redo stab avulsions in the right leg.  Patient was reassured that the recurrent varicosities do not represent a worsening of his overall health condition or put him at increased risk for DVT or emboli.  Ruta Hinds, MD [facility] Office: [phone] Pager: [phone]

## 2018-12-23 DIAGNOSIS — I83893 Varicose veins of bilateral lower extremities with other complications: Secondary | ICD-10-CM

## 2018-12-31 ENCOUNTER — Other Ambulatory Visit: Payer: Self-pay

## 2018-12-31 DIAGNOSIS — I480 Paroxysmal atrial fibrillation: Secondary | ICD-10-CM

## 2018-12-31 MED ORDER — RIVAROXABAN 20 MG PO TABS: 20.0000 mg | ORAL_TABLET | Freq: Every day | ORAL | 2 refills | Status: DC

## 2018-12-31 MED ORDER — FLECAINIDE ACETATE 100 MG PO TABS: 100.0000 mg | ORAL_TABLET | Freq: Two times a day (BID) | ORAL | 1 refills | Status: DC

## 2018-12-31 NOTE — Telephone Encounter (Signed)
Can we send this?

## 2019-01-05 DIAGNOSIS — N400 Enlarged prostate without lower urinary tract symptoms: Secondary | ICD-10-CM | POA: Diagnosis not present

## 2019-01-05 DIAGNOSIS — J40 Bronchitis, not specified as acute or chronic: Secondary | ICD-10-CM | POA: Diagnosis not present

## 2019-01-05 DIAGNOSIS — I10 Essential (primary) hypertension: Secondary | ICD-10-CM | POA: Diagnosis not present

## 2019-01-05 DIAGNOSIS — N401 Enlarged prostate with lower urinary tract symptoms: Secondary | ICD-10-CM | POA: Diagnosis not present

## 2019-01-05 DIAGNOSIS — M47812 Spondylosis without myelopathy or radiculopathy, cervical region: Secondary | ICD-10-CM | POA: Diagnosis not present

## 2019-01-05 DIAGNOSIS — J45909 Unspecified asthma, uncomplicated: Secondary | ICD-10-CM | POA: Diagnosis not present

## 2019-01-05 DIAGNOSIS — E7800 Pure hypercholesterolemia, unspecified: Secondary | ICD-10-CM | POA: Diagnosis not present

## 2019-01-05 DIAGNOSIS — I4891 Unspecified atrial fibrillation: Secondary | ICD-10-CM | POA: Diagnosis not present

## 2019-01-05 DIAGNOSIS — E785 Hyperlipidemia, unspecified: Secondary | ICD-10-CM | POA: Diagnosis not present

## 2019-01-05 DIAGNOSIS — J42 Unspecified chronic bronchitis: Secondary | ICD-10-CM | POA: Diagnosis not present

## 2019-01-06 ENCOUNTER — Other Ambulatory Visit: Payer: Self-pay | Admitting: Cardiology

## 2019-01-14 DIAGNOSIS — G8929 Other chronic pain: Secondary | ICD-10-CM | POA: Insufficient documentation

## 2019-02-07 DIAGNOSIS — N400 Enlarged prostate without lower urinary tract symptoms: Secondary | ICD-10-CM | POA: Diagnosis not present

## 2019-02-07 DIAGNOSIS — E7800 Pure hypercholesterolemia, unspecified: Secondary | ICD-10-CM | POA: Diagnosis not present

## 2019-02-07 DIAGNOSIS — J40 Bronchitis, not specified as acute or chronic: Secondary | ICD-10-CM | POA: Diagnosis not present

## 2019-02-07 DIAGNOSIS — M47812 Spondylosis without myelopathy or radiculopathy, cervical region: Secondary | ICD-10-CM | POA: Diagnosis not present

## 2019-02-07 DIAGNOSIS — I4891 Unspecified atrial fibrillation: Secondary | ICD-10-CM | POA: Diagnosis not present

## 2019-02-07 DIAGNOSIS — J42 Unspecified chronic bronchitis: Secondary | ICD-10-CM | POA: Diagnosis not present

## 2019-02-07 DIAGNOSIS — I10 Essential (primary) hypertension: Secondary | ICD-10-CM | POA: Diagnosis not present

## 2019-02-07 DIAGNOSIS — N401 Enlarged prostate with lower urinary tract symptoms: Secondary | ICD-10-CM | POA: Diagnosis not present

## 2019-02-07 DIAGNOSIS — J45909 Unspecified asthma, uncomplicated: Secondary | ICD-10-CM | POA: Diagnosis not present

## 2019-02-07 DIAGNOSIS — E785 Hyperlipidemia, unspecified: Secondary | ICD-10-CM | POA: Diagnosis not present

## 2019-02-12 ENCOUNTER — Ambulatory Visit: Payer: Medicare HMO

## 2019-02-17 ENCOUNTER — Ambulatory Visit: Payer: Medicare HMO

## 2019-02-19 ENCOUNTER — Ambulatory Visit: Payer: Medicare HMO | Attending: Internal Medicine

## 2019-02-19 DIAGNOSIS — Z23 Encounter for immunization: Secondary | ICD-10-CM | POA: Insufficient documentation

## 2019-02-19 NOTE — Progress Notes (Signed)
   Covid-19 Vaccination Clinic  Name:  Christian [name]    MRN: [id number] DOB: February 18, 1945  02/19/2019  [name] was observed post Covid-19 immunization for 15 minutes without incidence. He was provided with Vaccine Information Sheet and instruction to access the V-Safe system.   [name] was instructed to call 911 with any severe reactions post vaccine: Marland Kitchen Difficulty breathing  . Swelling of your face and throat  . A fast heartbeat  . A bad rash all over your body  . Dizziness and weakness    Immunizations Administered    Name Date Dose VIS Date Route   Pfizer COVID-19 Vaccine 02/19/2019  4:35 PM 0.3 mL 12/24/2018 Intramuscular   Manufacturer: [place]   Lot: [id number]   [place]: [id number]

## 2019-02-23 ENCOUNTER — Ambulatory Visit: Payer: Medicare HMO

## 2019-03-16 ENCOUNTER — Ambulatory Visit: Payer: Medicare HMO | Attending: Internal Medicine

## 2019-03-16 DIAGNOSIS — Z23 Encounter for immunization: Secondary | ICD-10-CM | POA: Insufficient documentation

## 2019-03-16 NOTE — Progress Notes (Signed)
   Covid-19 Vaccination Clinic  Name:  CALLON HERRIDGE    MRN: [id number] DOB: 1945/01/26  03/16/2019  Mr. Gane was observed post Covid-19 immunization for 15 minutes without incident. He was provided with Vaccine Information Sheet and instruction to access the V-Safe system.   Mr. Knuckles was instructed to call 911 with any severe reactions post vaccine: Marland Kitchen Difficulty breathing  . Swelling of face and throat  . A fast heartbeat  . A bad rash all over body  . Dizziness and weakness   Immunizations Administered    Name Date Dose VIS Date Route   Pfizer COVID-19 Vaccine 03/16/2019  1:22 PM 0.3 mL 12/24/2018 Intramuscular   Manufacturer: [place]   Lot: [id number]   [place]: [id number]

## 2019-03-20 DIAGNOSIS — R69 Illness, unspecified: Secondary | ICD-10-CM | POA: Diagnosis not present

## 2019-03-20 DIAGNOSIS — Z0100 Encounter for examination of eyes and vision without abnormal findings: Secondary | ICD-10-CM | POA: Diagnosis not present

## 2019-03-29 DIAGNOSIS — R69 Illness, unspecified: Secondary | ICD-10-CM | POA: Diagnosis not present

## 2019-04-01 DIAGNOSIS — J40 Bronchitis, not specified as acute or chronic: Secondary | ICD-10-CM | POA: Diagnosis not present

## 2019-04-01 DIAGNOSIS — G4700 Insomnia, unspecified: Secondary | ICD-10-CM | POA: Diagnosis not present

## 2019-04-01 DIAGNOSIS — J45909 Unspecified asthma, uncomplicated: Secondary | ICD-10-CM | POA: Diagnosis not present

## 2019-04-01 DIAGNOSIS — M47812 Spondylosis without myelopathy or radiculopathy, cervical region: Secondary | ICD-10-CM | POA: Diagnosis not present

## 2019-04-01 DIAGNOSIS — I10 Essential (primary) hypertension: Secondary | ICD-10-CM | POA: Diagnosis not present

## 2019-04-01 DIAGNOSIS — E7800 Pure hypercholesterolemia, unspecified: Secondary | ICD-10-CM | POA: Diagnosis not present

## 2019-04-01 DIAGNOSIS — I4891 Unspecified atrial fibrillation: Secondary | ICD-10-CM | POA: Diagnosis not present

## 2019-04-01 DIAGNOSIS — E785 Hyperlipidemia, unspecified: Secondary | ICD-10-CM | POA: Diagnosis not present

## 2019-04-01 DIAGNOSIS — N400 Enlarged prostate without lower urinary tract symptoms: Secondary | ICD-10-CM | POA: Diagnosis not present

## 2019-04-01 DIAGNOSIS — J42 Unspecified chronic bronchitis: Secondary | ICD-10-CM | POA: Diagnosis not present

## 2019-04-07 DIAGNOSIS — R310 Gross hematuria: Secondary | ICD-10-CM | POA: Diagnosis not present

## 2019-04-07 DIAGNOSIS — N528 Other male erectile dysfunction: Secondary | ICD-10-CM | POA: Diagnosis not present

## 2019-04-07 DIAGNOSIS — N401 Enlarged prostate with lower urinary tract symptoms: Secondary | ICD-10-CM | POA: Diagnosis not present

## 2019-04-07 DIAGNOSIS — R351 Nocturia: Secondary | ICD-10-CM | POA: Diagnosis not present

## 2019-04-11 DIAGNOSIS — R69 Illness, unspecified: Secondary | ICD-10-CM | POA: Diagnosis not present

## 2019-04-22 DIAGNOSIS — R310 Gross hematuria: Secondary | ICD-10-CM | POA: Diagnosis not present

## 2019-05-02 DIAGNOSIS — M545 Low back pain: Secondary | ICD-10-CM | POA: Diagnosis not present

## 2019-05-03 DIAGNOSIS — H04123 Dry eye syndrome of bilateral lacrimal glands: Secondary | ICD-10-CM | POA: Diagnosis not present

## 2019-05-04 DIAGNOSIS — M47896 Other spondylosis, lumbar region: Secondary | ICD-10-CM | POA: Diagnosis not present

## 2019-05-10 DIAGNOSIS — M47896 Other spondylosis, lumbar region: Secondary | ICD-10-CM | POA: Diagnosis not present

## 2019-05-12 DIAGNOSIS — E785 Hyperlipidemia, unspecified: Secondary | ICD-10-CM | POA: Diagnosis not present

## 2019-05-12 DIAGNOSIS — M47812 Spondylosis without myelopathy or radiculopathy, cervical region: Secondary | ICD-10-CM | POA: Diagnosis not present

## 2019-05-12 DIAGNOSIS — I4891 Unspecified atrial fibrillation: Secondary | ICD-10-CM | POA: Diagnosis not present

## 2019-05-12 DIAGNOSIS — I10 Essential (primary) hypertension: Secondary | ICD-10-CM | POA: Diagnosis not present

## 2019-05-12 DIAGNOSIS — J45909 Unspecified asthma, uncomplicated: Secondary | ICD-10-CM | POA: Diagnosis not present

## 2019-05-12 DIAGNOSIS — J42 Unspecified chronic bronchitis: Secondary | ICD-10-CM | POA: Diagnosis not present

## 2019-05-12 DIAGNOSIS — N400 Enlarged prostate without lower urinary tract symptoms: Secondary | ICD-10-CM | POA: Diagnosis not present

## 2019-05-12 DIAGNOSIS — J40 Bronchitis, not specified as acute or chronic: Secondary | ICD-10-CM | POA: Diagnosis not present

## 2019-05-12 DIAGNOSIS — G4700 Insomnia, unspecified: Secondary | ICD-10-CM | POA: Diagnosis not present

## 2019-05-12 DIAGNOSIS — E7800 Pure hypercholesterolemia, unspecified: Secondary | ICD-10-CM | POA: Diagnosis not present

## 2019-05-13 DIAGNOSIS — C672 Malignant neoplasm of lateral wall of bladder: Secondary | ICD-10-CM | POA: Diagnosis not present

## 2019-05-13 DIAGNOSIS — Q549 Hypospadias, unspecified: Secondary | ICD-10-CM | POA: Diagnosis not present

## 2019-05-14 DIAGNOSIS — U071 COVID-19: Secondary | ICD-10-CM

## 2019-05-14 HISTORY — DX: COVID-19: U07.1

## 2019-05-18 DIAGNOSIS — L821 Other seborrheic keratosis: Secondary | ICD-10-CM | POA: Diagnosis not present

## 2019-05-18 DIAGNOSIS — L578 Other skin changes due to chronic exposure to nonionizing radiation: Secondary | ICD-10-CM | POA: Diagnosis not present

## 2019-05-18 DIAGNOSIS — L820 Inflamed seborrheic keratosis: Secondary | ICD-10-CM | POA: Diagnosis not present

## 2019-05-19 ENCOUNTER — Other Ambulatory Visit: Payer: Self-pay | Admitting: Urology

## 2019-05-23 ENCOUNTER — Encounter: Payer: Self-pay | Admitting: Cardiology

## 2019-05-24 DIAGNOSIS — M47896 Other spondylosis, lumbar region: Secondary | ICD-10-CM | POA: Diagnosis not present

## 2019-05-26 ENCOUNTER — Other Ambulatory Visit: Payer: Self-pay

## 2019-05-26 ENCOUNTER — Encounter (HOSPITAL_BASED_OUTPATIENT_CLINIC_OR_DEPARTMENT_OTHER): Payer: Self-pay | Admitting: Urology

## 2019-05-26 DIAGNOSIS — M545 Low back pain: Secondary | ICD-10-CM | POA: Diagnosis not present

## 2019-05-26 DIAGNOSIS — Z6830 Body mass index (BMI) 30.0-30.9, adult: Secondary | ICD-10-CM | POA: Diagnosis not present

## 2019-05-26 NOTE — Progress Notes (Addendum)
ADDENDUM:  Chart to be reviewed by anesthesia, ok to proceed.   Spoke w/ via phone for pre-op interview--- PT Lab needs dos----  [place]             Lab results------ current ekg, 10-11-2018, in epic/ chart COVID test ------ 05-28-2019 @ 0950 Arrive at ------- 1030 NPO after ------ MN Medications to take morning of surgery ----- Flecainide, Lopressor, Claritin, Flomax w/ sips of water and do Symbicort inhaler Diabetic medication ----- n/a Patient Special Instructions ----- asked to bring rescue inhaler dos Pre-Op special Istructions ----- cardiac letter  clearance for xarelto by dr Einar Gip dated 05-23-2019 w/ chart Patient verbalized understanding of instructions that were given at this phone interview. Patient denies shortness of breath, chest pain, fever, cough a this phone interview.   Anesthesia Review: hx HTN, PAF, Venous insuff., Factor V.  Pt denies any cardiac s&s, sob, or difficulty breathing. Chart to be reviewed by Konrad Felix PA.  PCP: Dr Josetta Huddle (per pt lov 01/ 2021) Cardiologist : Dr Einar Gip (lov 10-06-2018 epic) Chest x-ray : 08-26-2012 epic EKG : 10-06-2018 epic Echo : 09-05-2015 epic Stress test:  Nuclear 09-09-2015 epic;  ETT 08-31-2017 epic Cardiac Cath :  no Sleep Study/ CPAP : NO Fasting Blood Sugar :      / Checks Blood Sugar -- times a day:   N/A Blood Thinner/ Instructions /Last Dose: Xarelto/  Pt stated was given instructions to stop 2 days prior to surgery by dr Einar Gip, whom manages it/  Stated last dose to be evening of 05-29-2019 ASA / Instructions/ Last Dose :  NO

## 2019-05-27 NOTE — Progress Notes (Signed)
Anesthesia Chart Review   Case: [id number] Date/Time: 06/01/19 1215   Procedure: CYSTOSCOPY WITH BLADDER BIOPSY/ FULGERATION (N/A ) - ONLY NEEDS 30 MIN   Anesthesia type: General   Pre-op diagnosis: BLADDER TUMOR   Location: [facility] OR ROOM 2 / [place]   Surgeons: Ceasar Mons, MD      DISCUSSION:74 y.o. h/o former smoker (20 pack years, quit 02/28/1995) with h/o HTN, asthma, symptomatic varicose veins followed by Dr. Oneida Alar, Factor V Leiden, PAF (on Xarelto), GERD, RBBB, BPH, bladder tumor scheduled for above procedure 06/01/2019 with Dr. Harrell Gave Lovena Neighbours.   Pt last seen by cardiologist 10/06/2018.  Per OV note stable at this visit with 1 year follow up.  Treadmill exercise stress test 09/2018 with excellent exercise tolerance.  Per Dr. Einar Gip, "Burna Cash is at low risk from a cardiac standpoint.  For his upcoming procedure cystoscopy with bladder biopsy.  It is ok to proceed without further cardiac testing.  Hold Xarelto for 2 days prior and restart 4-5 days post op.  Low cardio-embolic risk."  Anticipate pt can proceed with planned procedure barring acute status change.   VS: Ht 5\' 11"  (1.803 m)   Wt 102 kg   BMI 31.36 kg/m   PROVIDERS: Josetta Huddle, MD is PCP   Adrian Prows, MD is Cardiologist   Ruta Hinds, MD is Vascular Surgeon LABS: labs DOS (all labs ordered are listed, but only abnormal results are displayed)  Labs Reviewed - No data to display   IMAGES:   EKG: 10/06/2018 Rate 53 bpm  Sinus bradycardia  Right bundle branch block   CV:  Past Medical History:  Diagnosis Date  . Allergic rhinitis   . Anticoagulant long-term use    xarelto--- managed by dr Einar Gip  . Arthritis   . Asthma   . Benign localized prostatic hyperplasia with lower urinary tract symptoms (LUTS)   . Bladder tumor   . BPH (benign prostatic hypertrophy)   . DDD (degenerative disc disease), cervical   . DDD (degenerative disc disease), lumbar   . Edema of  right lower extremity    wears ted hose  . Factor V deficiency ([place])    05-26-2019 per pt has never had a dvt or pe but has had severeal episodes of superficial venous thrombosis, none recently  . Family history of atrial fibrillation    His brother, sister have A. fib, suspects his father also had A. fib  . Family history of factor V deficiency   . Frequency of urination   . GERD (gastroesophageal reflux disease)   . Hematuria   . History of chronic bronchitis   . Hypertension    followed by pcp  and dr Einar Gip   Brantley Fling 09-09-2015 low risk with normal perfusion and ef 60%;  ETT 08-31-2017 normal with no evidence ischemia : both in epic)  . Nocturia   . PAF (paroxysmal atrial fibrillation) [facility])    cardiologist----  dr Einar Gip-- dx 2017  . RBBB (right bundle branch block)   . Varicose veins of right lower extremity with pain   . Venous insufficiency of both lower extremities    vascular--- dr fields--  right > left ;  s/p  ablation and phlebectomies  . Wears glasses     Past Surgical History:  Procedure Laterality Date  . CATARACT EXTRACTION W/ INTRAOCULAR LENS  IMPLANT, BILATERAL Bilateral 2014  . COLONOSCOPY N/A 06/27/2014   Procedure: COLONOSCOPY;  Surgeon: Garlan Fair, MD;  Location: [facility];  Service: Endoscopy;  Laterality: N/A;  . ENDOVENOUS ABLATION SAPHENOUS VEIN W/ LASER Right 06/30/2016   EVLA R GSV by Tinnie Gens MD   . ENDOVENOUS ABLATION SAPHENOUS VEIN W/ LASER Left 07/14/2016   endovenous laser ablation Left greater saphenous vein by Tinnie Gens MD   . REPAIR CONGENITAL URETHRAL DEFECT  INFANT  . stab phlebectomy Left 11/24/2016   stab phlebectomy > 20 incisions left leg by Tinnie Gens MD   . stab phlebectomy  Right `12-29-2016   stab phlebectomy > 20 incisions right leg by Tinnie Gens MD   . 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   Marland Kitchen acetaminophen (TYLENOL) 500 MG tablet  . atorvastatin (LIPITOR) 10 MG tablet  . Cholecalciferol (D-3-5) 5000 UNITS capsule  . Coenzyme Q10 (COQ10) 100 MG CAPS  . finasteride (PROSCAR) 5 MG tablet  . flecainide (TAMBOCOR) 100 MG tablet  . fluticasone (FLONASE) 50 MCG/ACT nasal spray  . loratadine (CLARITIN) 10 MG tablet  . losartan-hydrochlorothiazide (HYZAAR) 100-12.5 MG tablet  . melatonin 5 MG TABS  . metoprolol tartrate (LOPRESSOR) 25 MG tablet  . montelukast (SINGULAIR) 10 MG tablet  . Multiple Vitamins-Minerals (CENTRUM SILVER PO)  . omeprazole (PRILOSEC) 20 MG capsule  . Psyllium (METAMUCIL PO)  . rivaroxaban (XARELTO) 20 MG TABS tablet  . sildenafil (VIAGRA) 50 MG tablet  . tamsulosin (FLOMAX) 0.4 MG CAPS capsule  . traMADol (ULTRAM) 50 MG tablet  . zolpidem (AMBIEN) 5 MG tablet  . PROAIR RESPICLICK 123XX123 (90 Base) MCG/ACT AEPB  . SYMBICORT 160-4.5 MCG/ACT inhaler    Maia Plan [facility] Pre-Surgical Testing [phone] 05/27/19  3:45 PM

## 2019-05-28 ENCOUNTER — Other Ambulatory Visit (HOSPITAL_COMMUNITY)
Admission: RE | Admit: 2019-05-28 | Discharge: 2019-05-28 | Disposition: A | Discharge status: Home / Self Care | Payer: Medicare HMO | Source: Ambulatory Visit | Attending: Urology | Admitting: Urology

## 2019-05-28 DIAGNOSIS — Z01812 Encounter for preprocedural laboratory examination: Secondary | ICD-10-CM | POA: Diagnosis not present

## 2019-05-28 DIAGNOSIS — Z20822 Contact with and (suspected) exposure to covid-19: Secondary | ICD-10-CM | POA: Diagnosis not present

## 2019-05-29 LAB — SARS CORONAVIRUS 2 (TAT 6-24 HRS): SARS Coronavirus 2: NEGATIVE

## 2019-06-01 ENCOUNTER — Ambulatory Visit (HOSPITAL_BASED_OUTPATIENT_CLINIC_OR_DEPARTMENT_OTHER): Payer: Medicare HMO | Admitting: Physician Assistant

## 2019-06-01 ENCOUNTER — Encounter (HOSPITAL_BASED_OUTPATIENT_CLINIC_OR_DEPARTMENT_OTHER): Payer: Self-pay | Admitting: Urology

## 2019-06-01 ENCOUNTER — Encounter (HOSPITAL_BASED_OUTPATIENT_CLINIC_OR_DEPARTMENT_OTHER)
Admission: RE | Disposition: A | Discharge status: Home / Self Care | Payer: Self-pay | Source: Home / Self Care | Attending: Urology

## 2019-06-01 ENCOUNTER — Ambulatory Visit (HOSPITAL_BASED_OUTPATIENT_CLINIC_OR_DEPARTMENT_OTHER)
Admission: RE | Admit: 2019-06-01 | Discharge: 2019-06-01 | Disposition: A | Discharge status: Home / Self Care | Payer: Medicare HMO | Attending: Urology | Admitting: Urology

## 2019-06-01 DIAGNOSIS — E785 Hyperlipidemia, unspecified: Secondary | ICD-10-CM | POA: Diagnosis not present

## 2019-06-01 DIAGNOSIS — Z6830 Body mass index (BMI) 30.0-30.9, adult: Secondary | ICD-10-CM | POA: Diagnosis not present

## 2019-06-01 DIAGNOSIS — Z79899 Other long term (current) drug therapy: Secondary | ICD-10-CM | POA: Insufficient documentation

## 2019-06-01 DIAGNOSIS — C679 Malignant neoplasm of bladder, unspecified: Secondary | ICD-10-CM | POA: Insufficient documentation

## 2019-06-01 DIAGNOSIS — J45909 Unspecified asthma, uncomplicated: Secondary | ICD-10-CM | POA: Insufficient documentation

## 2019-06-01 DIAGNOSIS — D6851 Activated protein C resistance: Secondary | ICD-10-CM | POA: Insufficient documentation

## 2019-06-01 DIAGNOSIS — Z87891 Personal history of nicotine dependence: Secondary | ICD-10-CM | POA: Insufficient documentation

## 2019-06-01 DIAGNOSIS — J90 Pleural effusion, not elsewhere classified: Secondary | ICD-10-CM | POA: Insufficient documentation

## 2019-06-01 DIAGNOSIS — D494 Neoplasm of unspecified behavior of bladder: Secondary | ICD-10-CM | POA: Diagnosis not present

## 2019-06-01 DIAGNOSIS — Z7951 Long term (current) use of inhaled steroids: Secondary | ICD-10-CM | POA: Diagnosis not present

## 2019-06-01 DIAGNOSIS — I700 Atherosclerosis of aorta: Secondary | ICD-10-CM | POA: Insufficient documentation

## 2019-06-01 DIAGNOSIS — M1990 Unspecified osteoarthritis, unspecified site: Secondary | ICD-10-CM | POA: Diagnosis not present

## 2019-06-01 DIAGNOSIS — N401 Enlarged prostate with lower urinary tract symptoms: Secondary | ICD-10-CM | POA: Diagnosis not present

## 2019-06-01 DIAGNOSIS — K219 Gastro-esophageal reflux disease without esophagitis: Secondary | ICD-10-CM | POA: Insufficient documentation

## 2019-06-01 DIAGNOSIS — I10 Essential (primary) hypertension: Secondary | ICD-10-CM | POA: Diagnosis not present

## 2019-06-01 DIAGNOSIS — N529 Male erectile dysfunction, unspecified: Secondary | ICD-10-CM | POA: Insufficient documentation

## 2019-06-01 DIAGNOSIS — D090 Carcinoma in situ of bladder: Secondary | ICD-10-CM | POA: Diagnosis not present

## 2019-06-01 DIAGNOSIS — E669 Obesity, unspecified: Secondary | ICD-10-CM | POA: Diagnosis not present

## 2019-06-01 DIAGNOSIS — I4510 Unspecified right bundle-branch block: Secondary | ICD-10-CM | POA: Insufficient documentation

## 2019-06-01 DIAGNOSIS — Z7901 Long term (current) use of anticoagulants: Secondary | ICD-10-CM | POA: Insufficient documentation

## 2019-06-01 HISTORY — DX: Venous insufficiency (chronic) (peripheral): I87.2

## 2019-06-01 HISTORY — DX: Family history of diseases of the blood and blood-forming organs and certain disorders involving the immune mechanism: Z83.2

## 2019-06-01 HISTORY — PX: CYSTOSCOPY WITH FULGERATION: SHX6638

## 2019-06-01 HISTORY — DX: Localized edema: R60.0

## 2019-06-01 HISTORY — DX: Hematuria, unspecified: R31.9

## 2019-06-01 HISTORY — DX: Personal history of other diseases of the respiratory system: Z87.09

## 2019-06-01 HISTORY — DX: Paroxysmal atrial fibrillation: I48.0

## 2019-06-01 HISTORY — DX: Presence of spectacles and contact lenses: Z97.3

## 2019-06-01 HISTORY — DX: Unspecified right bundle-branch block: I45.10

## 2019-06-01 HISTORY — DX: Neoplasm of unspecified behavior of bladder: D49.4

## 2019-06-01 HISTORY — DX: Benign prostatic hyperplasia with lower urinary tract symptoms: N40.1

## 2019-06-01 HISTORY — DX: Unspecified asthma, uncomplicated: J45.909

## 2019-06-01 HISTORY — DX: Gastro-esophageal reflux disease without esophagitis: K21.9

## 2019-06-01 HISTORY — DX: Allergic rhinitis, unspecified: J30.9

## 2019-06-01 HISTORY — DX: Varicose veins of right lower extremity with pain: I83.811

## 2019-06-01 HISTORY — DX: Long term (current) use of anticoagulants: Z79.01

## 2019-06-01 LAB — POCT I-STAT, CHEM 8
BUN: 22 mg/dL (ref 8–23)
Calcium, Ion: 1.22 mmol/L (ref 1.15–1.40)
Chloride: 102 mmol/L (ref 98–111)
Creatinine, Ser: 1 mg/dL (ref 0.61–1.24)
Glucose, Bld: 107 mg/dL — ABNORMAL HIGH (ref 70–99)
HCT: 47 % (ref 39.0–52.0)
Hemoglobin: 16 g/dL (ref 13.0–17.0)
Potassium: 4.5 mmol/L (ref 3.5–5.1)
Sodium: 139 mmol/L (ref 135–145)
TCO2: 27 mmol/L (ref 22–32)

## 2019-06-01 SURGERY — CYSTOSCOPY, WITH BLADDER FULGURATION
Anesthesia: General | Site: Bladder

## 2019-06-01 MED ORDER — LIDOCAINE 2% (20 MG/ML) 5 ML SYRINGE
INTRAMUSCULAR | Status: AC
  Filled 2019-06-01: qty 5

## 2019-06-01 MED ORDER — CEFAZOLIN SODIUM-DEXTROSE 2-4 GM/100ML-% IV SOLN
2.0000 g | Freq: Once | INTRAVENOUS | Status: AC
  Administered 2019-06-01: 2 g via INTRAVENOUS

## 2019-06-01 MED ORDER — PHENAZOPYRIDINE HCL 200 MG PO TABS
200.0000 mg | ORAL_TABLET | Freq: Three times a day (TID) | ORAL | 0 refills | Status: DC | PRN
Start: 2019-06-01 — End: 2019-10-26

## 2019-06-01 MED ORDER — MIDAZOLAM HCL 5 MG/5ML IJ SOLN
INTRAMUSCULAR | Status: DC | PRN
  Administered 2019-06-01 (×2): 1 mg via INTRAVENOUS

## 2019-06-01 MED ORDER — DEXAMETHASONE SODIUM PHOSPHATE 4 MG/ML IJ SOLN
INTRAMUSCULAR | Status: DC | PRN
Start: 2019-06-01 — End: 2019-06-01
  Administered 2019-06-01: 4 mg via INTRAVENOUS

## 2019-06-01 MED ORDER — ONDANSETRON HCL 4 MG/2ML IJ SOLN
INTRAMUSCULAR | Status: AC
  Filled 2019-06-01: qty 2

## 2019-06-01 MED ORDER — PROPOFOL 10 MG/ML IV BOLUS
INTRAVENOUS | Status: DC | PRN
  Administered 2019-06-01: 170 mg via INTRAVENOUS

## 2019-06-01 MED ORDER — CEFAZOLIN SODIUM-DEXTROSE 2-4 GM/100ML-% IV SOLN
INTRAVENOUS | Status: AC
  Filled 2019-06-01: qty 100

## 2019-06-01 MED ORDER — ONDANSETRON HCL 4 MG PO TABS: 4.0000 mg | ORAL_TABLET | Freq: Every day | ORAL | 1 refills | Status: DC | PRN

## 2019-06-01 MED ORDER — MIDAZOLAM HCL 2 MG/2ML IJ SOLN
INTRAMUSCULAR | Status: AC
  Filled 2019-06-01: qty 2

## 2019-06-01 MED ORDER — DEXAMETHASONE SODIUM PHOSPHATE 10 MG/ML IJ SOLN
INTRAMUSCULAR | Status: AC
  Filled 2019-06-01: qty 1

## 2019-06-01 MED ORDER — FENTANYL CITRATE (PF) 100 MCG/2ML IJ SOLN
INTRAMUSCULAR | Status: AC
  Filled 2019-06-01: qty 2

## 2019-06-01 MED ORDER — FENTANYL CITRATE (PF) 100 MCG/2ML IJ SOLN: 25.0000 ug | INTRAMUSCULAR | Status: DC | PRN

## 2019-06-01 MED ORDER — FENTANYL CITRATE (PF) 100 MCG/2ML IJ SOLN
INTRAMUSCULAR | Status: DC | PRN
  Administered 2019-06-01 (×2): 25 ug via INTRAVENOUS
  Administered 2019-06-01: 50 ug via INTRAVENOUS

## 2019-06-01 MED ORDER — ONDANSETRON HCL 4 MG/2ML IJ SOLN: 4.0000 mg | Freq: Once | INTRAMUSCULAR | Status: DC | PRN

## 2019-06-01 MED ORDER — LACTATED RINGERS IV SOLN: INTRAVENOUS | Status: DC

## 2019-06-01 MED ORDER — ONDANSETRON HCL 4 MG/2ML IJ SOLN
INTRAMUSCULAR | Status: DC | PRN
  Administered 2019-06-01: 4 mg via INTRAVENOUS

## 2019-06-01 MED ORDER — LIDOCAINE HCL 1 % IJ SOLN
INTRAMUSCULAR | Status: DC | PRN
  Administered 2019-06-01: 50 mg via INTRADERMAL

## 2019-06-01 MED ORDER — PROPOFOL 10 MG/ML IV BOLUS
INTRAVENOUS | Status: AC
  Filled 2019-06-01: qty 20

## 2019-06-01 MED ORDER — OXYBUTYNIN CHLORIDE 5 MG PO TABS: 5.0000 mg | ORAL_TABLET | Freq: Three times a day (TID) | ORAL | 1 refills | Status: DC | PRN

## 2019-06-01 MED ORDER — STERILE WATER FOR IRRIGATION IR SOLN
Status: DC | PRN
  Administered 2019-06-01 (×2): 3000 mL

## 2019-06-01 SURGICAL SUPPLY — 23 items
BAG DRAIN URO-CYSTO SKYTR STRL (DRAIN) ×2 IMPLANT
CATH ROBINSON RED A/P 14FR (CATHETERS) IMPLANT
CLOTH BEACON ORANGE TIMEOUT ST (SAFETY) ×2 IMPLANT
ELECT REM PT RETURN 9FT ADLT (ELECTROSURGICAL) ×2
ELECTRODE REM PT RTRN 9FT ADLT (ELECTROSURGICAL) ×1 IMPLANT
GLOVE BIO SURGEON STRL SZ 6.5 (GLOVE) ×2 IMPLANT
GLOVE BIO SURGEON STRL SZ7.5 (GLOVE) ×2 IMPLANT
GLOVE BIOGEL PI IND STRL 7.0 (GLOVE) IMPLANT
GLOVE BIOGEL PI IND STRL 7.5 (GLOVE) IMPLANT
GLOVE BIOGEL PI INDICATOR 7.0 (GLOVE) ×2
GLOVE BIOGEL PI INDICATOR 7.5 (GLOVE) ×1
GOWN STRL REUS W/ TWL LRG LVL3 (GOWN DISPOSABLE) IMPLANT
GOWN STRL REUS W/ TWL XL LVL3 (GOWN DISPOSABLE) ×1 IMPLANT
GOWN STRL REUS W/TWL LRG LVL3 (GOWN DISPOSABLE) ×4
GOWN STRL REUS W/TWL XL LVL3 (GOWN DISPOSABLE) ×2
KIT TURNOVER CYSTO (KITS) ×2 IMPLANT
MANIFOLD NEPTUNE II (INSTRUMENTS) ×2 IMPLANT
NDL SAFETY ECLIPSE 18X1.5 (NEEDLE) IMPLANT
NEEDLE HYPO 18GX1.5 SHARP (NEEDLE)
SYR 20ML LL LF (SYRINGE) IMPLANT
TRAY CYSTO PACK (CUSTOM PROCEDURE TRAY) ×2 IMPLANT
TUBE CONNECTING 12X1/4 (SUCTIONS) ×2 IMPLANT
WATER STERILE IRR 3000ML UROMA (IV SOLUTION) ×3 IMPLANT

## 2019-06-01 NOTE — H&P (Signed)
PRE-OP H&P  Office Visit Report     05/13/2019   Christian [name]  MRN: [id number]  DOB: 02-28-1945, 74 year old Male  PRIMARY CARE:  R Marcellus Scott, MD  REFERRING:  R Marcellus Scott, MD  PROVIDER:  Ellison Hughs, M.D.  LOCATION:  [facility] [phone]    CC/HPI: CC: Enlarged prostate   HPI: The patient is a 74 year old male with a history of BPH with lower urinary tract symptoms, status post TUIP in 2013. 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   Last PSA- 0.98 (2015)   05/13/19: The patient is here today for a routine follow-up and cystoscopy to complete his hematuria evaluation. CTU showed a 5 mm nodule involving the right base of the bladder with no other concerning features. He reports an episode of hematuria last Wednesday with mild dysuria following the void that has since resolved.     ALLERGIES: No Allergies    MEDICATIONS: Metoprolol Tartrate 25 mg tablet  Tamsulosin Hcl 0.4 mg capsule 1 capsule PO Q 12 H  Atorvastatin Calcium 10 mg tablet  CoQ10 CAPS 0 Oral  Flecainide Acetate  Fluticasone Propionate 50 mcg/actuation spray, suspension  Levocetirizine Dihydrochloride 5 mg tablet  Losartan-Hydrochlorothiazide 100 mg-25 mg tablet Oral  Montelukast Sodium 10 mg tablet  Multi-Vitamin TABS Oral  Proair Hfa PRN  Sildenafil Citrate 100 mg tablet 1 tablet PO PRN  Tramadol Hcl 50 mg tablet tablet PRN  Vitamin D TABS 0 Oral  Xarelto 20 mg tablet  Zolpidem Tartrate PRN     GU PSH: Cystoscopy TURP - 2013 Locm 300-399Mg /Ml Iodine,1Ml - 04/22/2019       PSH Notes: Transurethral Resection Of Prostate (TURP), Cataract Surgery, vein ablation    NON-GU PSH: Phleb Veins - Extrem - To 20     GU PMH: Gross hematuria - 04/07/2019 Other male ED - 02/18/2018 Nocturia (Stable) - 12/31/2016, Nocturia, - 2015 Weak Urinary Stream (Stable) -  12/31/2016, - 2018 Renal cyst - 2017 BPH w/LUTS, Benign prostatic hyperplasia with urinary obstruction - 2016 Male ED, unspecified, Organic erectile dysfunction - 2016 Urinary Frequency, Urinary frequency - 2015 Inflammatory Disease Prostate, Unspec, Prostatitis - 2014 Testicular pain, unspecified, Testicular pain - 2014      PMH Notes:  2006-04-06 13:45:51 - Note: Venous Thrombosis   NON-GU PMH: Encounter for general adult medical examination without abnormal findings, Encounter for preventive health examination - 2016 Atrial Fibrillation    FAMILY HISTORY: Family Health Status Number - Runs In Family Heart Rhythm Irregular - Father, Sister Urologic Disorder - Father Varicose Veins Of Lower Extremities - Mother, Sister   SOCIAL HISTORY: Marital Status: Married Preferred Language: English; Ethnicity: Not Hispanic Or Latino; Race: White Current Smoking Status: Patient does not smoke anymore. Has not smoked since 12/14/1995.   Tobacco Use Assessment Completed: Used Tobacco in last 30 days? Drinks 2 drinks per day. Types of alcohol consumed: Wine.  Drinks 1 caffeinated drink per day.     Notes: Former smoker, Occupation:, Tobacco Use, Marital History - Currently Married, Alcohol Use, Caffeine Use   REVIEW OF SYSTEMS:    GU Review Male:   Patient reports frequent urination, burning/ pain with urination, and get up at night to urinate. Patient denies hard to postpone urination, leakage of urine, stream starts and stops, trouble starting your stream, have to strain to urinate , erection problems, and penile pain.  Gastrointestinal (Upper):   Patient denies nausea, vomiting, and indigestion/ heartburn.  Gastrointestinal (Lower):   Patient denies diarrhea and constipation.  Constitutional:   Patient denies fever, night sweats, weight loss, and fatigue.  Skin:   Patient denies itching and skin rash/ lesion.  Eyes:   Patient denies blurred vision and double vision.  Ears/ Nose/ Throat:    Patient denies sore throat and sinus problems.  Hematologic/Lymphatic:   Patient denies swollen glands and easy bruising.  Cardiovascular:   Patient denies leg swelling and chest pains.  Respiratory:   Patient denies cough and shortness of breath.  Endocrine:   Patient denies excessive thirst.  Musculoskeletal:   Patient reports back pain. Patient denies joint pain.  Neurological:   Patient denies headaches and dizziness.  Psychologic:   Patient denies depression and anxiety.   Notes: blood in urine     VITAL SIGNS:      05/13/2019 09:08 AM  Weight 220 lb / 99.79 kg  Height 71 in / 180.34 cm  BP 122/83 mmHg  Heart Rate 65 /min  BMI 30.7 kg/m   GU PHYSICAL EXAMINATION:    Urethral Meatus: Subcoronal hypospadias . Normal size. No lesion, no wart, no polyp, no balanitis, no discharge.    MULTI-SYSTEM PHYSICAL EXAMINATION:    Constitutional: Well-nourished. No physical deformities. Normally developed. Good grooming.  Neck: Neck symmetrical, not swollen. Normal tracheal position.  Respiratory: No labored breathing, no use of accessory muscles.   Cardiovascular: Normal temperature, normal extremity pulses, no swelling, no varicosities.  Lymphatic: No enlargement of neck, axillae, groin.  Skin: No paleness, no jaundice, no cyanosis. No lesion, no ulcer, no rash.  Neurologic / Psychiatric: Oriented to time, oriented to place, oriented to person. No depression, no anxiety, no agitation.  Gastrointestinal: No mass, no tenderness, no rigidity, non obese abdomen.  Eyes: Normal conjunctivae. Normal eyelids.  Ears, Nose, Mouth, and Throat: Left ear no scars, no lesions, no masses. Right ear no scars, no lesions, no masses. Nose no scars, no lesions, no masses. Normal hearing. Normal lips.  Musculoskeletal: Normal gait and station of head and neck.     Complexity of Data:  X-Ray Review: C.T. Hematuria: Reviewed Films. Reviewed Report. Discussed With Patient.     06/17/13 06/16/12  PSA   Total PSA 0.98  0.98    Notes:                     CLINICAL DATA: Gross hematuria.   EXAM:  CT ABDOMEN AND PELVIS WITHOUT AND WITH CONTRAST   TECHNIQUE:  Multidetector CT imaging of the abdomen and pelvis was performed  following the standard protocol before and following the bolus  administration of intravenous contrast.   CONTRAST: 125 cc Omnipaque 300.   COMPARISON: None.   FINDINGS:  Lower chest: Lung bases show no acute findings. Mild dependent  atelectasis bilaterally. Heart size normal. No pericardial effusion.  Trace bilateral pleural effusions. Distal esophagus is unremarkable.   Hepatobiliary: 12 mm low-attenuation lesion in the dome of the liver  is too small to characterize. Liver and gallbladder are otherwise  unremarkable. No biliary ductal dilatation.   Pancreas: Negative.   Spleen: Negative.   Adrenals/Urinary Tract: Adrenal glands are unremarkable. 3.2 cm cyst  in the lower pole right kidney. Probable 9 mm cyst off the upper  pole left kidney, too small for definitive characterization. No  urinary stones. Ureters are decompressed. Patient was unable to lay  in the prone position. Distal left  ureter is poorly opacified.  Otherwise, no filling defects in the opacified portions of the  intrarenal collecting systems or ureters. There is a 7 x 8 mm nodule  along the right lateral base of the bladder (series 607, image 83  and axial series 11, image 29). TURP defect. There may be very mild  thickening of the ventral wall of the bladder.   Stomach/Bowel: Stomach, small bowel, appendix and colon  unremarkable.   Vascular/Lymphatic: Atherosclerotic calcification of the aorta  without aneurysm. No pathologically enlarged lymph nodes.   Reproductive: Trans urethral resection of the prostate per report.   Other: No free fluid. Mesenteries and peritoneum are unremarkable.   Musculoskeletal: Degenerative changes in the spine. No worrisome  lytic or sclerotic  lesions.   IMPRESSION:  1. Small nodule along the right lateral bladder base, worrisome for  urothelial carcinoma.  2. Distal left ureter is poorly opacified evaluation. Possible mild  thickening of the ventral bladder wall. No additional findings to  explain patient's hematuria.  3. Trace bilateral pleural effusions.  4. Aortic atherosclerosis (ICD10-I70.0).    Electronically Signed  By: Lorin Picket M.D.  On: 04/22/2019 13:17     PROCEDURES:         Flexible Cystoscopy - 52000  Risks, benefits, and some of the potential complications of the procedure were discussed at length with the patient including infection, bleeding, voiding discomfort, urinary retention, fever, chills, sepsis, and others. All questions were answered. Informed consent was obtained. Antibiotic prophylaxis was given. Sterile technique and intraurethral analgesia were used.  Meatus:  Normal size. Normal location. Normal condition.  Urethra:  No strictures.  External Sphincter:  Normal.  Verumontanum:  Normal.  Prostate:  Non-obstructing. No hyperplasia.  Bladder Neck:  Non-obstructing.  Ureteral Orifices:  Normal location. Normal size. Normal shape. Effluxed clear urine.  Bladder:  A right lateral wall tumor. < 1/2 cm tumor. No trabeculation. Normal mucosa. No stones.      The lower urinary tract was carefully examined. The procedure was well-tolerated and without complications. Antibiotic instructions were given. Instructions were given to call the office immediately for bloody urine, difficulty urinating, urinary retention, painful or frequent urination, fever, chills, nausea, vomiting or other illness. The patient stated that he understood these instructions and would comply with them.         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 The risks include, but are not limited to, bleeding, urinary tract infection, bladder perforation requiring prolonged catheterization and/or open bladder repair, ureteral obstruction, voiding dysfunction and the inherent risks of general anesthesia. The patient voices  understanding and wishes to proceed.

## 2019-06-01 NOTE — Anesthesia Preprocedure Evaluation (Addendum)
Anesthesia Evaluation  Patient identified by MRN, date of birth, ID band Patient awake    Reviewed: Allergy & Precautions, NPO status , Patient's Chart, lab work & pertinent test results  Airway Mallampati: II  TM Distance: >3 FB Neck ROM: Full    Dental no notable dental hx.    Pulmonary asthma (controlled) , former smoker,    Pulmonary exam normal breath sounds clear to auscultation       Cardiovascular hypertension, Pt. on medications and Pt. on home beta blockers Normal cardiovascular exam+ dysrhythmias Atrial Fibrillation  Rhythm:Regular Rate:Normal  ECG: SB, rate 53. RBBB   Neuro/Psych negative neurological ROS  negative psych ROS   GI/Hepatic Neg liver ROS, GERD  Medicated and Controlled,  Endo/Other  negative endocrine ROS  Renal/GU negative Renal ROS     Musculoskeletal  (+) Arthritis ,   Abdominal (+) + obese,   Peds  Hematology  (+) Blood dyscrasia, , HLD Factor V deficiency    Anesthesia Other Findings  BLADDER TUMOR  Reproductive/Obstetrics                            Anesthesia Physical Anesthesia Plan  ASA: III  Anesthesia Plan: General   Post-op Pain Management:    Induction: Intravenous  PONV Risk Score and Plan: 2 and Ondansetron, Dexamethasone and Treatment may vary due to age or medical condition  Airway Management Planned: LMA  Additional Equipment:   Intra-op Plan:   Post-operative Plan: Extubation in OR  Informed Consent: I have reviewed the patients History and Physical, chart, labs and discussed the procedure including the risks, benefits and alternatives for the proposed anesthesia with the patient or authorized representative who has indicated his/her understanding and acceptance.     Dental advisory given  Plan Discussed with: CRNA  Anesthesia Plan Comments:        Anesthesia Quick Evaluation

## 2019-06-01 NOTE — Op Note (Signed)
Operative Note  Preoperative diagnosis:  1. 1 cm papillary bladder tumor involving the 10 o'clock position at the rightward anterior bladder wall/bladder dome 2. Distal hypospadias  Postoperative diagnosis: 1. 1 cm papillary bladder tumor involving the 10 o'clock position at the rightward anterior bladder wall/bladder dome 2. Distal hypospadias 3. 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 Cystoscopy with bladder biopsy and fulguration  Surgeon: Ellison Hughs, MD  Assistants:  None  Anesthesia:  General  Complications:  None  EBL: 5 mL  Specimens: 1. Superficial and deep margins of right anterior bladder dome tumor  Drains/Catheters: 1. None  Intraoperative findings:   1. 1 cm papillary bladder tumor  Indication:  Christian [name] is a 74 y.o. male with a recent history of gross hematuria. CT urogram showed a 1 cm bladder lesion, concerning for urothelial carcinoma. He eventually had an office cystoscopy that confirmed the presence of a papillary bladder tumor, concerning for urothelial carcinoma. He has been consented for the above procedures, voices understanding and wishes to proceed.  Description of procedure:  After informed consent was obtained, the patient was brought to the operating room and general LMA anesthesia was administered. The patient was then placed in the dorsolithotomy position and prepped and draped in the usual sterile fashion. A timeout was performed. A 23 French rigid cystoscope was then inserted into the urethral meatus and advanced into the bladder under direct vision. A complete bladder survey revealed a 1 cm papillary bladder tumor involving the anterior bladder wall/bladder dome. No other intravesical pathology was identified. He was noted to have a naturally occurring false passage immediately posterior to his verumontanum.  Biopsy forceps were then used to remove the papillary bladder tumor with  superficial and deep margins taken for permanent section. The area of biopsy was then extensively fulgurated until hemostasis was achieved. The patient's bladder was then drained. He tolerated the procedure well and was transferred to the postanesthesia in stable condition.  Plan: Follow-up on 06/08/2019 to discuss pathology results.

## 2019-06-01 NOTE — Anesthesia Procedure Notes (Signed)
Procedure Name: LMA Insertion Date/Time: 06/01/2019 10:55 AM Performed by: Garrel Ridgel, CRNA Pre-anesthesia Checklist: Patient identified, Emergency Drugs available, Suction available, Patient being monitored and Timeout performed Patient Re-evaluated:Patient Re-evaluated prior to induction Oxygen Delivery Method: Circle system utilized Preoxygenation: Pre-oxygenation with 100% oxygen Induction Type: IV induction Ventilation: Mask ventilation without difficulty LMA: LMA inserted LMA Size: 4.0 Number of attempts: 1 Placement Confirmation: breath sounds checked- equal and bilateral and positive ETCO2 Tube secured with: Tape Dental Injury: Teeth and Oropharynx as per pre-operative assessment

## 2019-06-01 NOTE — Transfer of Care (Signed)
Immediate Anesthesia Transfer of Care Note  Patient: Christian [name]  Procedure(s) Performed: CYSTOSCOPY WITH BLADDER BIOPSY/ FULGERATION (N/A Bladder)  Patient Location: PACU  Anesthesia Type:General  Level of Consciousness: awake, alert , oriented and patient cooperative  Airway & Oxygen Therapy: Patient Spontanous Breathing and Patient connected to face mask oxygen  Post-op Assessment: Report given to RN and Post -op Vital signs reviewed and stable  Post vital signs: Reviewed and stable  Last Vitals:  Vitals Value Taken Time  BP 111/72 06/01/19 1133  Temp    Pulse 54 06/01/19 1136  Resp 12 06/01/19 1136  SpO2 100 % 06/01/19 1136  Vitals shown include unvalidated device data.  Last Pain:  Vitals:   06/01/19 0921  TempSrc: Oral  PainSc: 6       Patients Stated Pain Goal: 5 ([id number])  Complications: No apparent anesthesia complications

## 2019-06-01 NOTE — Discharge Instructions (Signed)

## 2019-06-01 NOTE — Anesthesia Postprocedure Evaluation (Signed)
Anesthesia Post Note  Patient: EDHER GING  Procedure(s) Performed: CYSTOSCOPY WITH BLADDER BIOPSY/ FULGERATION (N/A Bladder)     Patient location during evaluation: PACU Anesthesia Type: General Level of consciousness: awake Pain management: pain level controlled Vital Signs Assessment: post-procedure vital signs reviewed and stable Respiratory status: spontaneous breathing, nonlabored ventilation, respiratory function stable and patient connected to nasal cannula oxygen Cardiovascular status: blood pressure returned to baseline and stable Postop Assessment: no apparent nausea or vomiting Anesthetic complications: no    Last Vitals:  Vitals:   06/01/19 1200 06/01/19 1250  BP: 124/69 127/81  Pulse: (!) 55 (!) 55  Resp: 15 15  Temp:  36.7 C  SpO2: 100% 100%    Last Pain:  Vitals:   06/01/19 1250  TempSrc:   PainSc: 0-No pain                 Ryan P Ellender

## 2019-06-02 LAB — SURGICAL PATHOLOGY

## 2019-06-08 DIAGNOSIS — C672 Malignant neoplasm of lateral wall of bladder: Secondary | ICD-10-CM | POA: Diagnosis not present

## 2019-06-08 DIAGNOSIS — Q549 Hypospadias, unspecified: Secondary | ICD-10-CM | POA: Diagnosis not present

## 2019-06-15 DIAGNOSIS — M545 Low back pain: Secondary | ICD-10-CM | POA: Diagnosis not present

## 2019-06-15 DIAGNOSIS — M48061 Spinal stenosis, lumbar region without neurogenic claudication: Secondary | ICD-10-CM | POA: Diagnosis not present

## 2019-06-16 DIAGNOSIS — M48061 Spinal stenosis, lumbar region without neurogenic claudication: Secondary | ICD-10-CM | POA: Diagnosis not present

## 2019-06-16 DIAGNOSIS — M7062 Trochanteric bursitis, left hip: Secondary | ICD-10-CM | POA: Diagnosis not present

## 2019-06-20 ENCOUNTER — Other Ambulatory Visit: Payer: Self-pay | Admitting: Cardiology

## 2019-06-20 DIAGNOSIS — I480 Paroxysmal atrial fibrillation: Secondary | ICD-10-CM

## 2019-06-20 DIAGNOSIS — J301 Allergic rhinitis due to pollen: Secondary | ICD-10-CM | POA: Diagnosis not present

## 2019-06-20 DIAGNOSIS — H1045 Other chronic allergic conjunctivitis: Secondary | ICD-10-CM | POA: Diagnosis not present

## 2019-06-20 DIAGNOSIS — J3089 Other allergic rhinitis: Secondary | ICD-10-CM | POA: Diagnosis not present

## 2019-06-20 DIAGNOSIS — R05 Cough: Secondary | ICD-10-CM | POA: Diagnosis not present

## 2019-06-20 NOTE — Telephone Encounter (Signed)
Is this okay to refill? 

## 2019-06-20 NOTE — Telephone Encounter (Signed)
Refill request

## 2019-06-27 ENCOUNTER — Other Ambulatory Visit: Payer: Self-pay | Admitting: Cardiology

## 2019-06-27 ENCOUNTER — Encounter: Payer: Self-pay | Admitting: Cardiology

## 2019-06-27 DIAGNOSIS — I480 Paroxysmal atrial fibrillation: Secondary | ICD-10-CM

## 2019-06-27 MED ORDER — FLECAINIDE ACETATE 100 MG PO TABS: 100.0000 mg | ORAL_TABLET | Freq: Two times a day (BID) | ORAL | 3 refills | Status: DC

## 2019-06-27 NOTE — Telephone Encounter (Signed)
I have sent the Rx.

## 2019-06-27 NOTE — Telephone Encounter (Signed)
Is this okay to refill? 

## 2019-07-04 ENCOUNTER — Other Ambulatory Visit: Payer: Self-pay

## 2019-07-04 MED ORDER — RIVAROXABAN 20 MG PO TABS: 20.0000 mg | ORAL_TABLET | Freq: Every day | ORAL | 1 refills | Status: DC

## 2019-07-05 DIAGNOSIS — E785 Hyperlipidemia, unspecified: Secondary | ICD-10-CM | POA: Diagnosis not present

## 2019-07-05 DIAGNOSIS — G4700 Insomnia, unspecified: Secondary | ICD-10-CM | POA: Diagnosis not present

## 2019-07-05 DIAGNOSIS — M48061 Spinal stenosis, lumbar region without neurogenic claudication: Secondary | ICD-10-CM | POA: Diagnosis not present

## 2019-07-05 DIAGNOSIS — I10 Essential (primary) hypertension: Secondary | ICD-10-CM | POA: Diagnosis not present

## 2019-07-05 DIAGNOSIS — I4891 Unspecified atrial fibrillation: Secondary | ICD-10-CM | POA: Diagnosis not present

## 2019-07-05 DIAGNOSIS — M545 Low back pain: Secondary | ICD-10-CM | POA: Diagnosis not present

## 2019-07-05 DIAGNOSIS — J42 Unspecified chronic bronchitis: Secondary | ICD-10-CM | POA: Diagnosis not present

## 2019-07-05 DIAGNOSIS — E7800 Pure hypercholesterolemia, unspecified: Secondary | ICD-10-CM | POA: Diagnosis not present

## 2019-07-05 DIAGNOSIS — N401 Enlarged prostate with lower urinary tract symptoms: Secondary | ICD-10-CM | POA: Diagnosis not present

## 2019-07-05 DIAGNOSIS — M47812 Spondylosis without myelopathy or radiculopathy, cervical region: Secondary | ICD-10-CM | POA: Diagnosis not present

## 2019-07-05 DIAGNOSIS — J40 Bronchitis, not specified as acute or chronic: Secondary | ICD-10-CM | POA: Diagnosis not present

## 2019-07-05 DIAGNOSIS — N400 Enlarged prostate without lower urinary tract symptoms: Secondary | ICD-10-CM | POA: Diagnosis not present

## 2019-07-12 DIAGNOSIS — M48061 Spinal stenosis, lumbar region without neurogenic claudication: Secondary | ICD-10-CM | POA: Diagnosis not present

## 2019-07-12 DIAGNOSIS — M545 Low back pain: Secondary | ICD-10-CM | POA: Diagnosis not present

## 2019-07-14 DIAGNOSIS — M48061 Spinal stenosis, lumbar region without neurogenic claudication: Secondary | ICD-10-CM | POA: Diagnosis not present

## 2019-07-14 DIAGNOSIS — M545 Low back pain: Secondary | ICD-10-CM | POA: Diagnosis not present

## 2019-07-21 DIAGNOSIS — C672 Malignant neoplasm of lateral wall of bladder: Secondary | ICD-10-CM | POA: Diagnosis not present

## 2019-07-21 DIAGNOSIS — Q549 Hypospadias, unspecified: Secondary | ICD-10-CM | POA: Diagnosis not present

## 2019-07-25 ENCOUNTER — Telehealth: Payer: Self-pay

## 2019-07-25 NOTE — Telephone Encounter (Signed)
Patient stated that he has been checking his resting HR and it has 80, but an hour later, he is at resting HR 60, and back up to resting HR 90 and is concerned that he may be in A-Fib, even though his smartwatch hasn't told him he is.  Also, he started BCG Treatments for bladder cancer on Thurs and he is having side effects and the on call Urologist told him to [place], because he had bright red blood in his urine. Should he be concerned at all or come in for an OV? Please advise.   Call back #: [phone]

## 2019-07-25 NOTE — Telephone Encounter (Signed)
Called patient several times and the phone kept picking up, then hanging up.

## 2019-07-25 NOTE — Telephone Encounter (Signed)
Agree with stopping Xarelto due to bleeding. Continue to monitor and if needed can come in for an EKG

## 2019-07-26 DIAGNOSIS — R310 Gross hematuria: Secondary | ICD-10-CM | POA: Diagnosis not present

## 2019-07-26 NOTE — Telephone Encounter (Signed)
Called and spoke with patient regarding his concerns about his concerns about his HR.

## 2019-07-28 DIAGNOSIS — N3000 Acute cystitis without hematuria: Secondary | ICD-10-CM | POA: Diagnosis not present

## 2019-07-28 DIAGNOSIS — C672 Malignant neoplasm of lateral wall of bladder: Secondary | ICD-10-CM | POA: Diagnosis not present

## 2019-08-03 DIAGNOSIS — M545 Low back pain: Secondary | ICD-10-CM | POA: Diagnosis not present

## 2019-08-03 DIAGNOSIS — M47812 Spondylosis without myelopathy or radiculopathy, cervical region: Secondary | ICD-10-CM | POA: Diagnosis not present

## 2019-08-03 DIAGNOSIS — I10 Essential (primary) hypertension: Secondary | ICD-10-CM | POA: Diagnosis not present

## 2019-08-03 DIAGNOSIS — G4700 Insomnia, unspecified: Secondary | ICD-10-CM | POA: Diagnosis not present

## 2019-08-03 DIAGNOSIS — E7800 Pure hypercholesterolemia, unspecified: Secondary | ICD-10-CM | POA: Diagnosis not present

## 2019-08-03 DIAGNOSIS — J42 Unspecified chronic bronchitis: Secondary | ICD-10-CM | POA: Diagnosis not present

## 2019-08-03 DIAGNOSIS — I4891 Unspecified atrial fibrillation: Secondary | ICD-10-CM | POA: Diagnosis not present

## 2019-08-03 DIAGNOSIS — J40 Bronchitis, not specified as acute or chronic: Secondary | ICD-10-CM | POA: Diagnosis not present

## 2019-08-03 DIAGNOSIS — M48061 Spinal stenosis, lumbar region without neurogenic claudication: Secondary | ICD-10-CM | POA: Diagnosis not present

## 2019-08-03 DIAGNOSIS — N400 Enlarged prostate without lower urinary tract symptoms: Secondary | ICD-10-CM | POA: Diagnosis not present

## 2019-08-03 DIAGNOSIS — N401 Enlarged prostate with lower urinary tract symptoms: Secondary | ICD-10-CM | POA: Diagnosis not present

## 2019-08-03 DIAGNOSIS — E785 Hyperlipidemia, unspecified: Secondary | ICD-10-CM | POA: Diagnosis not present

## 2019-08-16 DIAGNOSIS — R69 Illness, unspecified: Secondary | ICD-10-CM | POA: Diagnosis not present

## 2019-08-24 DIAGNOSIS — J40 Bronchitis, not specified as acute or chronic: Secondary | ICD-10-CM | POA: Diagnosis not present

## 2019-08-24 DIAGNOSIS — I4891 Unspecified atrial fibrillation: Secondary | ICD-10-CM | POA: Diagnosis not present

## 2019-08-24 DIAGNOSIS — I10 Essential (primary) hypertension: Secondary | ICD-10-CM | POA: Diagnosis not present

## 2019-08-24 DIAGNOSIS — N400 Enlarged prostate without lower urinary tract symptoms: Secondary | ICD-10-CM | POA: Diagnosis not present

## 2019-08-24 DIAGNOSIS — M47812 Spondylosis without myelopathy or radiculopathy, cervical region: Secondary | ICD-10-CM | POA: Diagnosis not present

## 2019-08-24 DIAGNOSIS — G4700 Insomnia, unspecified: Secondary | ICD-10-CM | POA: Diagnosis not present

## 2019-08-24 DIAGNOSIS — E7800 Pure hypercholesterolemia, unspecified: Secondary | ICD-10-CM | POA: Diagnosis not present

## 2019-08-24 DIAGNOSIS — N401 Enlarged prostate with lower urinary tract symptoms: Secondary | ICD-10-CM | POA: Diagnosis not present

## 2019-08-24 DIAGNOSIS — E785 Hyperlipidemia, unspecified: Secondary | ICD-10-CM | POA: Diagnosis not present

## 2019-08-24 DIAGNOSIS — J42 Unspecified chronic bronchitis: Secondary | ICD-10-CM | POA: Diagnosis not present

## 2019-09-12 DIAGNOSIS — Z8551 Personal history of malignant neoplasm of bladder: Secondary | ICD-10-CM | POA: Diagnosis not present

## 2019-09-20 DIAGNOSIS — I10 Essential (primary) hypertension: Secondary | ICD-10-CM | POA: Diagnosis not present

## 2019-09-20 DIAGNOSIS — N401 Enlarged prostate with lower urinary tract symptoms: Secondary | ICD-10-CM | POA: Diagnosis not present

## 2019-09-20 DIAGNOSIS — E7800 Pure hypercholesterolemia, unspecified: Secondary | ICD-10-CM | POA: Diagnosis not present

## 2019-09-20 DIAGNOSIS — M47812 Spondylosis without myelopathy or radiculopathy, cervical region: Secondary | ICD-10-CM | POA: Diagnosis not present

## 2019-09-20 DIAGNOSIS — N400 Enlarged prostate without lower urinary tract symptoms: Secondary | ICD-10-CM | POA: Diagnosis not present

## 2019-09-20 DIAGNOSIS — E785 Hyperlipidemia, unspecified: Secondary | ICD-10-CM | POA: Diagnosis not present

## 2019-09-20 DIAGNOSIS — I4891 Unspecified atrial fibrillation: Secondary | ICD-10-CM | POA: Diagnosis not present

## 2019-09-20 DIAGNOSIS — J42 Unspecified chronic bronchitis: Secondary | ICD-10-CM | POA: Diagnosis not present

## 2019-09-20 DIAGNOSIS — G4700 Insomnia, unspecified: Secondary | ICD-10-CM | POA: Diagnosis not present

## 2019-09-20 DIAGNOSIS — J40 Bronchitis, not specified as acute or chronic: Secondary | ICD-10-CM | POA: Diagnosis not present

## 2019-09-22 DIAGNOSIS — R69 Illness, unspecified: Secondary | ICD-10-CM | POA: Diagnosis not present

## 2019-10-05 DIAGNOSIS — W57XXXA Bitten or stung by nonvenomous insect and other nonvenomous arthropods, initial encounter: Secondary | ICD-10-CM | POA: Diagnosis not present

## 2019-10-05 DIAGNOSIS — R21 Rash and other nonspecific skin eruption: Secondary | ICD-10-CM | POA: Diagnosis not present

## 2019-10-06 ENCOUNTER — Ambulatory Visit: Payer: Medicare HMO | Admitting: Cardiology

## 2019-10-21 DIAGNOSIS — L308 Other specified dermatitis: Secondary | ICD-10-CM | POA: Diagnosis not present

## 2019-10-21 DIAGNOSIS — L309 Dermatitis, unspecified: Secondary | ICD-10-CM | POA: Diagnosis not present

## 2019-10-21 DIAGNOSIS — D485 Neoplasm of uncertain behavior of skin: Secondary | ICD-10-CM | POA: Diagnosis not present

## 2019-10-21 DIAGNOSIS — L820 Inflamed seborrheic keratosis: Secondary | ICD-10-CM | POA: Diagnosis not present

## 2019-10-26 ENCOUNTER — Other Ambulatory Visit: Payer: Self-pay

## 2019-10-26 ENCOUNTER — Encounter: Payer: Self-pay | Admitting: Cardiology

## 2019-10-26 ENCOUNTER — Ambulatory Visit: Payer: Medicare HMO | Admitting: Cardiology

## 2019-10-26 VITALS — BP 129/69 | HR 52 | Resp 16 | Ht 71.0 in | Wt 225.0 lb

## 2019-10-26 DIAGNOSIS — E7800 Pure hypercholesterolemia, unspecified: Secondary | ICD-10-CM | POA: Diagnosis not present

## 2019-10-26 DIAGNOSIS — I4891 Unspecified atrial fibrillation: Secondary | ICD-10-CM | POA: Diagnosis not present

## 2019-10-26 DIAGNOSIS — E785 Hyperlipidemia, unspecified: Secondary | ICD-10-CM

## 2019-10-26 DIAGNOSIS — G4700 Insomnia, unspecified: Secondary | ICD-10-CM | POA: Diagnosis not present

## 2019-10-26 DIAGNOSIS — N400 Enlarged prostate without lower urinary tract symptoms: Secondary | ICD-10-CM | POA: Diagnosis not present

## 2019-10-26 DIAGNOSIS — I10 Essential (primary) hypertension: Secondary | ICD-10-CM

## 2019-10-26 DIAGNOSIS — I480 Paroxysmal atrial fibrillation: Secondary | ICD-10-CM

## 2019-10-26 DIAGNOSIS — M47812 Spondylosis without myelopathy or radiculopathy, cervical region: Secondary | ICD-10-CM | POA: Diagnosis not present

## 2019-10-26 DIAGNOSIS — J42 Unspecified chronic bronchitis: Secondary | ICD-10-CM | POA: Diagnosis not present

## 2019-10-26 DIAGNOSIS — J40 Bronchitis, not specified as acute or chronic: Secondary | ICD-10-CM | POA: Diagnosis not present

## 2019-10-26 DIAGNOSIS — N401 Enlarged prostate with lower urinary tract symptoms: Secondary | ICD-10-CM | POA: Diagnosis not present

## 2019-10-26 NOTE — Progress Notes (Signed)
Primary Physician/Referring:  Josetta Huddle, MD  Patient ID: Christian [name], male    DOB: 23-Sep-1945, 74 y.o.   MRN: [id number]  Chief Complaint  Patient presents with  . Atrial Fibrillation  . Follow-up    1 year   HPI:    Christian [name]  is a 74 y.o. Caucasian male with paroxysmal atrial fibrillation first episode on 08/27/2015, history of bilateral venous insufficiency S/P ablation left leg in 2019, hypertension. He is presently on flecainide and is tolerating this well, post flecainide initiation treadmill stress test was negative for any inducible arrhythmia.  He has chronic underlying bradycardia but remains asymptomatic and had excellent exercise tolerance by stress test in 2019.  He has had a negative nuclear stress test in 2017.  He presents for an annual follow up visit. Since his last visit he has been feeling well from a cardiac standpoint. Denies chest pain, dyspnea, orthopnea, palpitations, syncope. He still has varicose veins in his legs, worse on the right. However he manages these by wearing compression stockings and they don't cause him discomfort. He notes he was diagnosed with a bladder tumor and is being followed by urology for this with regular observation.  Past Medical History:  Diagnosis Date  . Allergic rhinitis   . Anticoagulant long-term use    xarelto--- managed by dr Einar Gip  . Arthritis   . Asthma   . Benign localized prostatic hyperplasia with lower urinary tract symptoms (LUTS)   . Bladder tumor   . BPH (benign prostatic hypertrophy)   . DDD (degenerative disc disease), cervical   . DDD (degenerative disc disease), lumbar   . Edema of right lower extremity    wears ted hose  . Factor V deficiency ([place])    05-26-2019 per pt has never had a dvt or pe but has had severeal episodes of superficial venous thrombosis, none recently  . Family history of atrial fibrillation    His brother, sister have A. fib, suspects his father also had A. fib  . Family history  of factor V deficiency   . Frequency of urination   . GERD (gastroesophageal reflux disease)   . Hematuria   . History of chronic bronchitis   . Hypertension    followed by pcp  and dr Einar Gip   Christian [name] 09-09-2015 low risk with normal perfusion and ef 60%;  ETT 08-31-2017 normal with no evidence ischemia : both in epic)  . Nocturia   . PAF (paroxysmal atrial fibrillation) [facility])    cardiologist----  dr Einar Gip-- dx 2017  . RBBB (right bundle branch block)   . Varicose veins of right lower extremity with pain   . Venous insufficiency of both lower extremities    vascular--- dr fields--  right > left ;  s/p  ablation and phlebectomies  . Wears glasses    Past Surgical History:  Procedure Laterality Date  . CATARACT EXTRACTION W/ INTRAOCULAR LENS  IMPLANT, BILATERAL Bilateral 2014  . COLONOSCOPY N/A 06/27/2014   Procedure: COLONOSCOPY;  Surgeon: Garlan Fair, MD;  Location: [facility];  Service: Endoscopy;  Laterality: N/A;  . CYSTOSCOPY WITH FULGERATION N/A 06/01/2019   Procedure: CYSTOSCOPY WITH BLADDER BIOPSY/ FULGERATION;  Surgeon: Ceasar Mons, MD;  Location: [facility];  Service: Urology;  Laterality: N/A;  . ENDOVENOUS ABLATION SAPHENOUS VEIN W/ LASER Right 06/30/2016   EVLA R GSV by Tinnie Gens MD   . ENDOVENOUS ABLATION SAPHENOUS VEIN W/ LASER Left 07/14/2016   endovenous laser  ablation Left greater saphenous vein by Tinnie Gens MD   . REPAIR CONGENITAL URETHRAL DEFECT  INFANT  . stab phlebectomy Left 11/24/2016   stab phlebectomy > 20 incisions left leg by Tinnie Gens MD   . stab phlebectomy  Right `12-29-2016   stab phlebectomy > 20 incisions right leg by Tinnie Gens MD   . TRANSURETHRAL RESECTION OF PROSTATE  03/10/2011   Procedure: TRANSURETHRAL RESECTION OF THE PROSTATE WITH GYRUS INSTRUMENTS;  Surgeon: Bernestine Amass, MD;  Location: [facility];  Service: Urology;  Laterality: N/A;  [place] TURP OWER    Social  History   Tobacco Use  . Smoking status: Former Smoker    Packs/day: 1.00    Years: 20.00    Pack years: 20.00    Types: Cigarettes    Quit date: 02/28/1995    Years since quitting: 24.6  . Smokeless tobacco: Never Used  Substance Use Topics  . Alcohol use: Yes    Alcohol/week: 14.0 standard drinks    Types: 14 Shots of liquor per week    Comment: 2 drinks daily  Marital Status: Married  ROS  Review of Systems  Cardiovascular: Positive for leg swelling (mild, bilateral ankles). Negative for chest pain, dyspnea on exertion, palpitations and syncope.   Objective   Vitals with BMI 10/26/2019 06/01/2019 06/01/2019  Height 5\' 11"  - -  Weight 225 lbs - -  BMI 78.46 - -  Systolic 962 952 841  Diastolic 69 81 69  Pulse 52 55 55    Blood pressure 129/69, pulse (!) 52, resp. rate 16, height 5\' 11"  (1.803 m), weight 225 lb (102.1 kg), SpO2 100 %. Body mass index is 31.38 kg/m.   Physical Exam Constitutional:      General: He is not in acute distress.    Appearance: He is well-developed.     Comments: Mildly obese  Neck:     Thyroid: No thyromegaly.     Vascular: No JVD.  Cardiovascular:     Rate and Rhythm: Regular rhythm. Bradycardia present.     Pulses: Intact distal pulses.     Heart sounds: Normal heart sounds. No murmur heard.  No gallop.      Comments: Bilateral lower extremity varicose veins noted, right worse. Mild edema at bilateral ankles, right worse than left.  Pulmonary:     Effort: Pulmonary effort is normal.     Breath sounds: Normal breath sounds.  Abdominal:     General: Bowel sounds are normal.     Palpations: Abdomen is soft.    Radiology: No results found.  Laboratory examination:   Recent Labs    06/01/19 0937  NA 139  K 4.5  CL 102  GLUCOSE 107*  BUN 22  CREATININE 1.00   CMP Latest Ref Rng & Units 06/01/2019 08/27/2015 03/10/2011  Glucose 70 - 99 mg/dL 107(H) 99 102(H)  BUN 8 - 23 mg/dL 22 20 -  Creatinine 0.61 - 1.24 mg/dL 1.00 1.17 -   Sodium 135 - 145 mmol/L 139 139 144  Potassium 3.5 - 5.1 mmol/L 4.5 4.0 3.5  Chloride 98 - 111 mmol/L 102 108 -  CO2 22 - 32 mmol/L - 22 -  Calcium 8.9 - 10.3 mg/dL - 9.3 -   CBC Latest Ref Rng & Units 06/01/2019 08/27/2015 03/10/2011  WBC 4.0 - 10.5 K/uL - 8.3 -  Hemoglobin 13.0 - 17.0 g/dL 16.0 14.4 16.3  Hematocrit 39 - 52 % 47.0 43.4 48.0  Platelets 150 -  400 K/uL - 278 -   Lipid Panel     Component Value Date/Time   CHOL 148 08/28/2015 0048   TRIG 157 (H) 08/28/2015 0048   HDL 45 08/28/2015 0048   CHOLHDL 3.3 08/28/2015 0048   VLDL 31 08/28/2015 0048   LDLCALC 72 08/28/2015 0048   HEMOGLOBIN A1C Lab Results  Component Value Date   HGBA1C 5.9 (H) 08/27/2015   MPG 123 08/27/2015   TSH No results for input(s): TSH in the last 8760 hours.   External Labs: Cholesterol, total 164.000 m 11/24/2018 HDL 63.000 mg 11/24/2018 LDL-C 79.000 mg 11/24/2018 Triglycerides 109.000 m 11/24/2018  Hemoglobin 16.000 g/d 06/01/2019 Creatinine, Serum 1.000 mg/ 06/01/2019 Potassium 4.500 mm 06/01/2019  TSH 1.860 11/24/2018  Medications   Allergies  Allergen Reactions  . Gin [Alcohol] Other (See Comments)    "Pins and needles feeling and blotches on my neck"  . Adhesive [Tape] Other (See Comments)    Redness/ irritation  . Lobster [Shellfish Allergy] Hives    And per pt positive Allergy test    Current Outpatient Medications on File Prior to Visit  Medication Sig Dispense Refill  . acetaminophen (TYLENOL) 500 MG tablet Take 1,000 mg by mouth 3 (three) times daily.     Marland Kitchen atorvastatin (LIPITOR) 10 MG tablet TAKE 1 TABLET BY MOUTH EVERY DAY 90 tablet 2  . Cholecalciferol (D-3-5) 5000 UNITS capsule Take 5,000 Units by mouth at bedtime.     . Coenzyme Q10 (COQ10) 100 MG CAPS Take 1 capsule by mouth at bedtime.    . finasteride (PROSCAR) 5 MG tablet Take 5 mg by mouth at bedtime.     . flecainide (TAMBOCOR) 100 MG tablet Take 1 tablet (100 mg total) by mouth 2 (two) times daily. 180  tablet 3  . fluticasone (FLONASE) 50 MCG/ACT nasal spray Place 1-2 sprays into both nostrils daily.     Marland Kitchen loratadine (CLARITIN) 10 MG tablet Take 10 mg by mouth daily.    Marland Kitchen losartan-hydrochlorothiazide (HYZAAR) 100-12.5 MG tablet Take 1 tablet by mouth daily. (Patient taking differently: Take 1 tablet by mouth daily. ) 90 tablet 2  . melatonin 5 MG TABS Take 5 mg by mouth at bedtime.    . montelukast (SINGULAIR) 10 MG tablet Take 10 mg by mouth at bedtime.  3  . Multiple Vitamins-Minerals (CENTRUM SILVER PO) Take 1 tablet by mouth at bedtime.     Marland Kitchen omeprazole (PRILOSEC) 20 MG capsule Take 20 mg by mouth every other day. At qhs    . rivaroxaban (XARELTO) 20 MG TABS tablet Take 1 tablet (20 mg total) by mouth daily after supper. 90 tablet 1  . sildenafil (VIAGRA) 50 MG tablet Take 50 mg by mouth daily as needed for erectile dysfunction.     . SYMBICORT 160-4.5 MCG/ACT inhaler Inhale 2 puffs into the lungs in the morning and at bedtime.     . tamsulosin (FLOMAX) 0.4 MG CAPS capsule Take 0.4 mg by mouth in the morning and at bedtime.     . traMADol (ULTRAM) 50 MG tablet Take 50 mg by mouth at bedtime.    . triamcinolone cream (KENALOG) 0.1 % Apply topically.    Marland Kitchen zolpidem (AMBIEN) 5 MG tablet Take 5 mg by mouth at bedtime as needed for sleep.      No current facility-administered medications on file prior to visit.    Cardiac Studies:   Echocardiogram 09/05/2015: Left ventricle cavity is normal in size. Mild concentric hypertrophy of the left ventricle.  Normal global wall motion. Normal diastolic filling pattern. Calculated EF 55%. Left atrial cavity is moderately dilated at 4.5 cm. Trace mitral regurgitation. Mild tricuspid regurgitation. No evidence of pulmonary hypertension.  Lexiscan myoview stress test 09/03/2015: 1. Resting EKG demonstrated normal sinus rhythm, leftward axis, right bundle branch block.  Low-voltage complexes.  Stress EKG was nondiagnostic for ischemia as this is a  pharmacologic stress test.  Patient attempt treadmill stress testing, however unable to complete stress test due to marked exercise intolerance. 2. The perfusion imaging study demonstrates mild gut uptake artifact in the inferior wall without any demonstrable ischemia or scar.  Left ventricular systolic function calculated by QGS was 60%. This is a low risk study.   Exercise Treadmill Stress Test 08/31/2017:  Indication: SoB  The patient exercised on Bruce protocol for  10:05 min. Patient achieved  10.16 METS and reached HR  124 bpm, which is   82% of maximum age-predicted HR.  Stress test terminated due to Fatigue.   Exercise capacity was normal. HR Response to Exercise: Attenuated secondary to medication (metoprololol 25 mg bid, last dose 15 hours ago. ) BP Response to Exercise: Normal resting BP- appropriate response. Chest Pain: none. Arrhythmias: none. Resting EKG demonstrates RBBB with ST-T changes in precordial peads related to RBBB Exercise EKG: Sinus tachycardia with RBBB with ST-T changes in precordial peads related to RBBB. No ischemic cahnges seen.  Overall Impression: Submaximal stress test with no ishcmic sympyoms or EKG changes Continue primary/secondary prevention. Consider alterante etiology for shortness of breath, or  further cardiac workup if pre test probability for CAD is high.  EKG:   EKG 10/26/2019: Sinus bradycardia at rate of 51 bpm, normal axis, right bundle branch block.  No evidence of ischemia.    EKG 10/06/2018: Sinus bradycardia at the rate of 53 bpm, left atrial enlargement, leftward axis.  Right bundle branch block.  No evidence of ischemia. No significant change from  EKG 03/22/2018.  Assessment     ICD-10-CM   1. Paroxysmal atrial fibrillation (HCC)  I48.0 EKG 12-Lead  2. Essential hypertension  I10   3. Mild hyperlipidemia  E78.5     Medications Discontinued During This Encounter  Medication Reason  . metoprolol tartrate (LOPRESSOR) 25 MG tablet  Change in therapy  . ondansetron (ZOFRAN) 4 MG tablet Patient Preference  . oxybutynin (DITROPAN) 5 MG tablet Patient Preference  . phenazopyridine (PYRIDIUM) 200 MG tablet Patient Preference  . PROAIR RESPICLICK 937 (90 Base) MCG/ACT AEPB Patient Preference  . Psyllium (METAMUCIL PO) Patient Preference   No orders of the defined types were placed in this encounter.  Recommendations:   Christian [name] is a 74 y.o. male with a history of paroxysmal atrial fibrillation, presently on flecainide 100 mg p.o. b.i.d, history of bilateral venous insufficiency S/P ablation left leg in 2019, hypertension. He continues to have asymptomatic sinus bradycardia and change from 2017 through now. A treadmill exercise stress test in September 2020 with excellent exercise tolerance but chronotropic incompetence probably related to his antiarrhythmic drugs.  The patient presents for annual follow up for paroxysmal atrial fibrillation. He has been essentially asymptomatic from a cardiac standpoint. He is on flecainide and tolerating this well, anticoagulated on Xarelto without bleeding diathesis. EKG today shows sinus bradycardia.   He has mild edema at the ankles bilaterally, right worse than the left. I suspect this is related to his chronic venous insufficiency. He has bilateral varicose veins. He manages this by wearing compression stockings and does not find  it bothersome.   I reviewed his most recent labs. Lipids are well controlled. No anemia. Renal function is normal. Blood pressure is very well controlled. Overall from a cardiac standpoint he is stable. No changes to medications are needed today. I will see him back in one year for follow up.  Blair Heys, PA Student 10/29/19  11:21 AM  Patient seen and examined in conjunction with Blair Heys, PA second year student at [employer].  Time spent is in direct patient face to face encounter not including the teaching and training involved.    Adrian Prows,  MD, [facility] 10/29/2019, 11:21 AM Office: [phone]

## 2019-11-07 DIAGNOSIS — M7062 Trochanteric bursitis, left hip: Secondary | ICD-10-CM | POA: Diagnosis not present

## 2019-11-07 DIAGNOSIS — M5136 Other intervertebral disc degeneration, lumbar region: Secondary | ICD-10-CM | POA: Diagnosis not present

## 2019-11-07 DIAGNOSIS — M48061 Spinal stenosis, lumbar region without neurogenic claudication: Secondary | ICD-10-CM | POA: Diagnosis not present

## 2019-11-22 DIAGNOSIS — G4700 Insomnia, unspecified: Secondary | ICD-10-CM | POA: Diagnosis not present

## 2019-11-22 DIAGNOSIS — N400 Enlarged prostate without lower urinary tract symptoms: Secondary | ICD-10-CM | POA: Diagnosis not present

## 2019-11-22 DIAGNOSIS — E785 Hyperlipidemia, unspecified: Secondary | ICD-10-CM | POA: Diagnosis not present

## 2019-11-22 DIAGNOSIS — J42 Unspecified chronic bronchitis: Secondary | ICD-10-CM | POA: Diagnosis not present

## 2019-11-22 DIAGNOSIS — M47812 Spondylosis without myelopathy or radiculopathy, cervical region: Secondary | ICD-10-CM | POA: Diagnosis not present

## 2019-11-22 DIAGNOSIS — J40 Bronchitis, not specified as acute or chronic: Secondary | ICD-10-CM | POA: Diagnosis not present

## 2019-11-22 DIAGNOSIS — K219 Gastro-esophageal reflux disease without esophagitis: Secondary | ICD-10-CM | POA: Diagnosis not present

## 2019-11-22 DIAGNOSIS — I10 Essential (primary) hypertension: Secondary | ICD-10-CM | POA: Diagnosis not present

## 2019-11-22 DIAGNOSIS — E7800 Pure hypercholesterolemia, unspecified: Secondary | ICD-10-CM | POA: Diagnosis not present

## 2019-11-22 DIAGNOSIS — I4891 Unspecified atrial fibrillation: Secondary | ICD-10-CM | POA: Diagnosis not present

## 2019-11-30 DIAGNOSIS — M5136 Other intervertebral disc degeneration, lumbar region: Secondary | ICD-10-CM | POA: Diagnosis not present

## 2019-12-13 DIAGNOSIS — D3131 Benign neoplasm of right choroid: Secondary | ICD-10-CM | POA: Diagnosis not present

## 2019-12-13 DIAGNOSIS — Z961 Presence of intraocular lens: Secondary | ICD-10-CM | POA: Diagnosis not present

## 2019-12-13 DIAGNOSIS — H04123 Dry eye syndrome of bilateral lacrimal glands: Secondary | ICD-10-CM | POA: Diagnosis not present

## 2019-12-16 ENCOUNTER — Other Ambulatory Visit: Payer: Self-pay | Admitting: Cardiology

## 2019-12-19 DIAGNOSIS — N401 Enlarged prostate with lower urinary tract symptoms: Secondary | ICD-10-CM | POA: Diagnosis not present

## 2019-12-19 DIAGNOSIS — Z8551 Personal history of malignant neoplasm of bladder: Secondary | ICD-10-CM | POA: Diagnosis not present

## 2019-12-19 DIAGNOSIS — R3912 Poor urinary stream: Secondary | ICD-10-CM | POA: Diagnosis not present

## 2019-12-19 DIAGNOSIS — R310 Gross hematuria: Secondary | ICD-10-CM | POA: Diagnosis not present

## 2019-12-20 DIAGNOSIS — Z79899 Other long term (current) drug therapy: Secondary | ICD-10-CM | POA: Diagnosis not present

## 2019-12-20 DIAGNOSIS — E7800 Pure hypercholesterolemia, unspecified: Secondary | ICD-10-CM | POA: Diagnosis not present

## 2019-12-20 DIAGNOSIS — M47812 Spondylosis without myelopathy or radiculopathy, cervical region: Secondary | ICD-10-CM | POA: Diagnosis not present

## 2019-12-20 DIAGNOSIS — Z0000 Encounter for general adult medical examination without abnormal findings: Secondary | ICD-10-CM | POA: Diagnosis not present

## 2019-12-20 DIAGNOSIS — C679 Malignant neoplasm of bladder, unspecified: Secondary | ICD-10-CM | POA: Diagnosis not present

## 2019-12-20 DIAGNOSIS — J45909 Unspecified asthma, uncomplicated: Secondary | ICD-10-CM | POA: Diagnosis not present

## 2019-12-20 DIAGNOSIS — I10 Essential (primary) hypertension: Secondary | ICD-10-CM | POA: Diagnosis not present

## 2019-12-20 DIAGNOSIS — Z1389 Encounter for screening for other disorder: Secondary | ICD-10-CM | POA: Diagnosis not present

## 2019-12-20 DIAGNOSIS — K219 Gastro-esophageal reflux disease without esophagitis: Secondary | ICD-10-CM | POA: Diagnosis not present

## 2019-12-20 DIAGNOSIS — I4891 Unspecified atrial fibrillation: Secondary | ICD-10-CM | POA: Diagnosis not present

## 2019-12-20 DIAGNOSIS — E559 Vitamin D deficiency, unspecified: Secondary | ICD-10-CM | POA: Diagnosis not present

## 2019-12-20 DIAGNOSIS — D6869 Other thrombophilia: Secondary | ICD-10-CM | POA: Diagnosis not present

## 2019-12-22 DIAGNOSIS — R69 Illness, unspecified: Secondary | ICD-10-CM | POA: Diagnosis not present

## 2019-12-28 DIAGNOSIS — E7800 Pure hypercholesterolemia, unspecified: Secondary | ICD-10-CM | POA: Diagnosis not present

## 2019-12-28 DIAGNOSIS — J40 Bronchitis, not specified as acute or chronic: Secondary | ICD-10-CM | POA: Diagnosis not present

## 2019-12-28 DIAGNOSIS — I4891 Unspecified atrial fibrillation: Secondary | ICD-10-CM | POA: Diagnosis not present

## 2019-12-28 DIAGNOSIS — E785 Hyperlipidemia, unspecified: Secondary | ICD-10-CM | POA: Diagnosis not present

## 2019-12-28 DIAGNOSIS — N400 Enlarged prostate without lower urinary tract symptoms: Secondary | ICD-10-CM | POA: Diagnosis not present

## 2019-12-28 DIAGNOSIS — M47812 Spondylosis without myelopathy or radiculopathy, cervical region: Secondary | ICD-10-CM | POA: Diagnosis not present

## 2019-12-28 DIAGNOSIS — N401 Enlarged prostate with lower urinary tract symptoms: Secondary | ICD-10-CM | POA: Diagnosis not present

## 2019-12-28 DIAGNOSIS — J42 Unspecified chronic bronchitis: Secondary | ICD-10-CM | POA: Diagnosis not present

## 2019-12-28 DIAGNOSIS — G4700 Insomnia, unspecified: Secondary | ICD-10-CM | POA: Diagnosis not present

## 2019-12-28 DIAGNOSIS — I10 Essential (primary) hypertension: Secondary | ICD-10-CM | POA: Diagnosis not present

## 2019-12-29 DIAGNOSIS — L578 Other skin changes due to chronic exposure to nonionizing radiation: Secondary | ICD-10-CM | POA: Diagnosis not present

## 2019-12-29 DIAGNOSIS — D2272 Melanocytic nevi of left lower limb, including hip: Secondary | ICD-10-CM | POA: Diagnosis not present

## 2019-12-29 DIAGNOSIS — L723 Sebaceous cyst: Secondary | ICD-10-CM | POA: Diagnosis not present

## 2019-12-29 DIAGNOSIS — D1722 Benign lipomatous neoplasm of skin and subcutaneous tissue of left arm: Secondary | ICD-10-CM | POA: Diagnosis not present

## 2019-12-29 DIAGNOSIS — L821 Other seborrheic keratosis: Secondary | ICD-10-CM | POA: Diagnosis not present

## 2019-12-29 DIAGNOSIS — L570 Actinic keratosis: Secondary | ICD-10-CM | POA: Diagnosis not present

## 2020-01-02 DIAGNOSIS — R69 Illness, unspecified: Secondary | ICD-10-CM | POA: Diagnosis not present

## 2020-01-02 DIAGNOSIS — H6121 Impacted cerumen, right ear: Secondary | ICD-10-CM | POA: Diagnosis not present

## 2020-01-06 DIAGNOSIS — N390 Urinary tract infection, site not specified: Secondary | ICD-10-CM | POA: Diagnosis not present

## 2020-01-06 DIAGNOSIS — N342 Other urethritis: Secondary | ICD-10-CM | POA: Diagnosis not present

## 2020-01-17 ENCOUNTER — Telehealth: Payer: Self-pay | Admitting: Unknown Physician Specialty

## 2020-01-17 ENCOUNTER — Telehealth: Payer: Self-pay

## 2020-01-17 NOTE — Telephone Encounter (Signed)
Called to discuss with patient about Covid symptoms and the use of a monoclonal antibody infusion for those with mild to moderate Covid symptoms and at a high risk of hospitalization.   Pt is qualified for the monoclonal antibody infusion, but is vaccinated and feeling better.   Gabriel Cirri, NP  01/17/2020 3:46 PM     Sx onset 1/1. Does not quality for oral with drug interaction.

## 2020-01-17 NOTE — Telephone Encounter (Signed)
Pt's pcp recommended he take 20 mg of viagra for dementia prevention. He wanted to make sure you were ok with this, please advise.

## 2020-01-18 NOTE — Telephone Encounter (Signed)
Pt's phone hangs  up every time I try to call

## 2020-01-19 NOTE — Telephone Encounter (Signed)
Call will not go through. We have made several attempts.

## 2020-01-24 DIAGNOSIS — I4891 Unspecified atrial fibrillation: Secondary | ICD-10-CM | POA: Diagnosis not present

## 2020-01-24 DIAGNOSIS — N401 Enlarged prostate with lower urinary tract symptoms: Secondary | ICD-10-CM | POA: Diagnosis not present

## 2020-01-24 DIAGNOSIS — M47812 Spondylosis without myelopathy or radiculopathy, cervical region: Secondary | ICD-10-CM | POA: Diagnosis not present

## 2020-01-24 DIAGNOSIS — E7800 Pure hypercholesterolemia, unspecified: Secondary | ICD-10-CM | POA: Diagnosis not present

## 2020-01-24 DIAGNOSIS — J40 Bronchitis, not specified as acute or chronic: Secondary | ICD-10-CM | POA: Diagnosis not present

## 2020-01-24 DIAGNOSIS — N400 Enlarged prostate without lower urinary tract symptoms: Secondary | ICD-10-CM | POA: Diagnosis not present

## 2020-01-24 DIAGNOSIS — J42 Unspecified chronic bronchitis: Secondary | ICD-10-CM | POA: Diagnosis not present

## 2020-01-24 DIAGNOSIS — I10 Essential (primary) hypertension: Secondary | ICD-10-CM | POA: Diagnosis not present

## 2020-01-24 DIAGNOSIS — G4700 Insomnia, unspecified: Secondary | ICD-10-CM | POA: Diagnosis not present

## 2020-01-24 DIAGNOSIS — E785 Hyperlipidemia, unspecified: Secondary | ICD-10-CM | POA: Diagnosis not present

## 2020-01-26 DIAGNOSIS — R310 Gross hematuria: Secondary | ICD-10-CM | POA: Diagnosis not present

## 2020-02-02 DIAGNOSIS — M25552 Pain in left hip: Secondary | ICD-10-CM | POA: Diagnosis not present

## 2020-02-02 DIAGNOSIS — M25551 Pain in right hip: Secondary | ICD-10-CM | POA: Diagnosis not present

## 2020-02-13 DIAGNOSIS — R351 Nocturia: Secondary | ICD-10-CM | POA: Diagnosis not present

## 2020-02-13 DIAGNOSIS — N401 Enlarged prostate with lower urinary tract symptoms: Secondary | ICD-10-CM | POA: Diagnosis not present

## 2020-02-27 DIAGNOSIS — I4891 Unspecified atrial fibrillation: Secondary | ICD-10-CM | POA: Diagnosis not present

## 2020-02-27 DIAGNOSIS — N401 Enlarged prostate with lower urinary tract symptoms: Secondary | ICD-10-CM | POA: Diagnosis not present

## 2020-02-27 DIAGNOSIS — J45909 Unspecified asthma, uncomplicated: Secondary | ICD-10-CM | POA: Diagnosis not present

## 2020-02-27 DIAGNOSIS — I10 Essential (primary) hypertension: Secondary | ICD-10-CM | POA: Diagnosis not present

## 2020-02-27 DIAGNOSIS — G4700 Insomnia, unspecified: Secondary | ICD-10-CM | POA: Diagnosis not present

## 2020-02-27 DIAGNOSIS — E785 Hyperlipidemia, unspecified: Secondary | ICD-10-CM | POA: Diagnosis not present

## 2020-02-27 DIAGNOSIS — N400 Enlarged prostate without lower urinary tract symptoms: Secondary | ICD-10-CM | POA: Diagnosis not present

## 2020-02-27 DIAGNOSIS — K219 Gastro-esophageal reflux disease without esophagitis: Secondary | ICD-10-CM | POA: Diagnosis not present

## 2020-02-27 DIAGNOSIS — M47812 Spondylosis without myelopathy or radiculopathy, cervical region: Secondary | ICD-10-CM | POA: Diagnosis not present

## 2020-02-27 DIAGNOSIS — E7800 Pure hypercholesterolemia, unspecified: Secondary | ICD-10-CM | POA: Diagnosis not present

## 2020-03-07 DIAGNOSIS — M7062 Trochanteric bursitis, left hip: Secondary | ICD-10-CM | POA: Diagnosis not present

## 2020-03-07 DIAGNOSIS — M7061 Trochanteric bursitis, right hip: Secondary | ICD-10-CM | POA: Diagnosis not present

## 2020-03-14 DIAGNOSIS — M7061 Trochanteric bursitis, right hip: Secondary | ICD-10-CM | POA: Diagnosis not present

## 2020-03-15 DIAGNOSIS — M7062 Trochanteric bursitis, left hip: Secondary | ICD-10-CM | POA: Diagnosis not present

## 2020-03-15 DIAGNOSIS — M6281 Muscle weakness (generalized): Secondary | ICD-10-CM | POA: Diagnosis not present

## 2020-03-15 DIAGNOSIS — M7061 Trochanteric bursitis, right hip: Secondary | ICD-10-CM | POA: Diagnosis not present

## 2020-03-17 ENCOUNTER — Other Ambulatory Visit: Payer: Self-pay | Admitting: Cardiology

## 2020-03-17 DIAGNOSIS — I480 Paroxysmal atrial fibrillation: Secondary | ICD-10-CM

## 2020-03-19 NOTE — Telephone Encounter (Signed)
From pt

## 2020-03-21 DIAGNOSIS — G4700 Insomnia, unspecified: Secondary | ICD-10-CM | POA: Diagnosis not present

## 2020-03-21 DIAGNOSIS — I10 Essential (primary) hypertension: Secondary | ICD-10-CM | POA: Diagnosis not present

## 2020-03-21 DIAGNOSIS — J40 Bronchitis, not specified as acute or chronic: Secondary | ICD-10-CM | POA: Diagnosis not present

## 2020-03-21 DIAGNOSIS — K219 Gastro-esophageal reflux disease without esophagitis: Secondary | ICD-10-CM | POA: Diagnosis not present

## 2020-03-21 DIAGNOSIS — N401 Enlarged prostate with lower urinary tract symptoms: Secondary | ICD-10-CM | POA: Diagnosis not present

## 2020-03-21 DIAGNOSIS — J45909 Unspecified asthma, uncomplicated: Secondary | ICD-10-CM | POA: Diagnosis not present

## 2020-03-21 DIAGNOSIS — J42 Unspecified chronic bronchitis: Secondary | ICD-10-CM | POA: Diagnosis not present

## 2020-03-21 DIAGNOSIS — E785 Hyperlipidemia, unspecified: Secondary | ICD-10-CM | POA: Diagnosis not present

## 2020-03-21 DIAGNOSIS — I4891 Unspecified atrial fibrillation: Secondary | ICD-10-CM | POA: Diagnosis not present

## 2020-04-13 DIAGNOSIS — R310 Gross hematuria: Secondary | ICD-10-CM | POA: Diagnosis not present

## 2020-04-16 DIAGNOSIS — R3912 Poor urinary stream: Secondary | ICD-10-CM | POA: Diagnosis not present

## 2020-04-16 DIAGNOSIS — Z8551 Personal history of malignant neoplasm of bladder: Secondary | ICD-10-CM | POA: Diagnosis not present

## 2020-04-16 DIAGNOSIS — N401 Enlarged prostate with lower urinary tract symptoms: Secondary | ICD-10-CM | POA: Diagnosis not present

## 2020-04-19 DIAGNOSIS — L570 Actinic keratosis: Secondary | ICD-10-CM | POA: Diagnosis not present

## 2020-04-19 DIAGNOSIS — L299 Pruritus, unspecified: Secondary | ICD-10-CM | POA: Diagnosis not present

## 2020-04-19 DIAGNOSIS — D485 Neoplasm of uncertain behavior of skin: Secondary | ICD-10-CM | POA: Diagnosis not present

## 2020-04-19 DIAGNOSIS — L119 Acantholytic disorder, unspecified: Secondary | ICD-10-CM | POA: Diagnosis not present

## 2020-05-02 DIAGNOSIS — E785 Hyperlipidemia, unspecified: Secondary | ICD-10-CM | POA: Diagnosis not present

## 2020-05-02 DIAGNOSIS — J42 Unspecified chronic bronchitis: Secondary | ICD-10-CM | POA: Diagnosis not present

## 2020-05-02 DIAGNOSIS — M47812 Spondylosis without myelopathy or radiculopathy, cervical region: Secondary | ICD-10-CM | POA: Diagnosis not present

## 2020-05-02 DIAGNOSIS — K219 Gastro-esophageal reflux disease without esophagitis: Secondary | ICD-10-CM | POA: Diagnosis not present

## 2020-05-02 DIAGNOSIS — I4891 Unspecified atrial fibrillation: Secondary | ICD-10-CM | POA: Diagnosis not present

## 2020-05-02 DIAGNOSIS — I10 Essential (primary) hypertension: Secondary | ICD-10-CM | POA: Diagnosis not present

## 2020-05-02 DIAGNOSIS — G4700 Insomnia, unspecified: Secondary | ICD-10-CM | POA: Diagnosis not present

## 2020-05-02 DIAGNOSIS — E7800 Pure hypercholesterolemia, unspecified: Secondary | ICD-10-CM | POA: Diagnosis not present

## 2020-05-02 DIAGNOSIS — N401 Enlarged prostate with lower urinary tract symptoms: Secondary | ICD-10-CM | POA: Diagnosis not present

## 2020-05-02 DIAGNOSIS — J45909 Unspecified asthma, uncomplicated: Secondary | ICD-10-CM | POA: Diagnosis not present

## 2020-05-03 DIAGNOSIS — M5412 Radiculopathy, cervical region: Secondary | ICD-10-CM | POA: Diagnosis not present

## 2020-05-14 ENCOUNTER — Encounter: Payer: Self-pay | Admitting: Cardiology

## 2020-05-29 DIAGNOSIS — M5412 Radiculopathy, cervical region: Secondary | ICD-10-CM | POA: Diagnosis not present

## 2020-05-31 DIAGNOSIS — L821 Other seborrheic keratosis: Secondary | ICD-10-CM | POA: Diagnosis not present

## 2020-05-31 DIAGNOSIS — M5412 Radiculopathy, cervical region: Secondary | ICD-10-CM | POA: Diagnosis not present

## 2020-05-31 DIAGNOSIS — D225 Melanocytic nevi of trunk: Secondary | ICD-10-CM | POA: Diagnosis not present

## 2020-05-31 DIAGNOSIS — D1722 Benign lipomatous neoplasm of skin and subcutaneous tissue of left arm: Secondary | ICD-10-CM | POA: Diagnosis not present

## 2020-05-31 DIAGNOSIS — D485 Neoplasm of uncertain behavior of skin: Secondary | ICD-10-CM | POA: Diagnosis not present

## 2020-05-31 DIAGNOSIS — D2272 Melanocytic nevi of left lower limb, including hip: Secondary | ICD-10-CM | POA: Diagnosis not present

## 2020-05-31 DIAGNOSIS — L649 Androgenic alopecia, unspecified: Secondary | ICD-10-CM | POA: Diagnosis not present

## 2020-05-31 DIAGNOSIS — Z808 Family history of malignant neoplasm of other organs or systems: Secondary | ICD-10-CM | POA: Diagnosis not present

## 2020-05-31 DIAGNOSIS — L578 Other skin changes due to chronic exposure to nonionizing radiation: Secondary | ICD-10-CM | POA: Diagnosis not present

## 2020-05-31 DIAGNOSIS — L723 Sebaceous cyst: Secondary | ICD-10-CM | POA: Diagnosis not present

## 2020-05-31 DIAGNOSIS — L820 Inflamed seborrheic keratosis: Secondary | ICD-10-CM | POA: Diagnosis not present

## 2020-05-31 DIAGNOSIS — D2271 Melanocytic nevi of right lower limb, including hip: Secondary | ICD-10-CM | POA: Diagnosis not present

## 2020-06-12 ENCOUNTER — Other Ambulatory Visit: Payer: Self-pay | Admitting: Cardiology

## 2020-06-12 DIAGNOSIS — J45909 Unspecified asthma, uncomplicated: Secondary | ICD-10-CM | POA: Diagnosis not present

## 2020-06-12 DIAGNOSIS — N401 Enlarged prostate with lower urinary tract symptoms: Secondary | ICD-10-CM | POA: Diagnosis not present

## 2020-06-12 DIAGNOSIS — E785 Hyperlipidemia, unspecified: Secondary | ICD-10-CM | POA: Diagnosis not present

## 2020-06-12 DIAGNOSIS — E7800 Pure hypercholesterolemia, unspecified: Secondary | ICD-10-CM | POA: Diagnosis not present

## 2020-06-12 DIAGNOSIS — J40 Bronchitis, not specified as acute or chronic: Secondary | ICD-10-CM | POA: Diagnosis not present

## 2020-06-12 DIAGNOSIS — K219 Gastro-esophageal reflux disease without esophagitis: Secondary | ICD-10-CM | POA: Diagnosis not present

## 2020-06-12 DIAGNOSIS — I10 Essential (primary) hypertension: Secondary | ICD-10-CM | POA: Diagnosis not present

## 2020-06-12 DIAGNOSIS — J42 Unspecified chronic bronchitis: Secondary | ICD-10-CM | POA: Diagnosis not present

## 2020-06-12 DIAGNOSIS — G4700 Insomnia, unspecified: Secondary | ICD-10-CM | POA: Diagnosis not present

## 2020-06-12 DIAGNOSIS — I4891 Unspecified atrial fibrillation: Secondary | ICD-10-CM | POA: Diagnosis not present

## 2020-06-12 DIAGNOSIS — M5412 Radiculopathy, cervical region: Secondary | ICD-10-CM | POA: Diagnosis not present

## 2020-06-18 DIAGNOSIS — J3089 Other allergic rhinitis: Secondary | ICD-10-CM | POA: Diagnosis not present

## 2020-06-18 DIAGNOSIS — J301 Allergic rhinitis due to pollen: Secondary | ICD-10-CM | POA: Diagnosis not present

## 2020-06-18 DIAGNOSIS — R059 Cough, unspecified: Secondary | ICD-10-CM | POA: Diagnosis not present

## 2020-06-18 DIAGNOSIS — H1045 Other chronic allergic conjunctivitis: Secondary | ICD-10-CM | POA: Diagnosis not present

## 2020-06-19 DIAGNOSIS — M5412 Radiculopathy, cervical region: Secondary | ICD-10-CM | POA: Diagnosis not present

## 2020-06-25 DIAGNOSIS — M5412 Radiculopathy, cervical region: Secondary | ICD-10-CM | POA: Diagnosis not present

## 2020-06-26 DIAGNOSIS — Z6831 Body mass index (BMI) 31.0-31.9, adult: Secondary | ICD-10-CM | POA: Diagnosis not present

## 2020-06-26 DIAGNOSIS — M5412 Radiculopathy, cervical region: Secondary | ICD-10-CM | POA: Diagnosis not present

## 2020-06-27 DIAGNOSIS — N401 Enlarged prostate with lower urinary tract symptoms: Secondary | ICD-10-CM | POA: Diagnosis not present

## 2020-06-27 DIAGNOSIS — J40 Bronchitis, not specified as acute or chronic: Secondary | ICD-10-CM | POA: Diagnosis not present

## 2020-06-27 DIAGNOSIS — K219 Gastro-esophageal reflux disease without esophagitis: Secondary | ICD-10-CM | POA: Diagnosis not present

## 2020-06-27 DIAGNOSIS — E785 Hyperlipidemia, unspecified: Secondary | ICD-10-CM | POA: Diagnosis not present

## 2020-06-27 DIAGNOSIS — J45909 Unspecified asthma, uncomplicated: Secondary | ICD-10-CM | POA: Diagnosis not present

## 2020-06-27 DIAGNOSIS — I10 Essential (primary) hypertension: Secondary | ICD-10-CM | POA: Diagnosis not present

## 2020-06-27 DIAGNOSIS — J42 Unspecified chronic bronchitis: Secondary | ICD-10-CM | POA: Diagnosis not present

## 2020-06-27 DIAGNOSIS — I4891 Unspecified atrial fibrillation: Secondary | ICD-10-CM | POA: Diagnosis not present

## 2020-06-27 DIAGNOSIS — G4700 Insomnia, unspecified: Secondary | ICD-10-CM | POA: Diagnosis not present

## 2020-07-02 DIAGNOSIS — Q549 Hypospadias, unspecified: Secondary | ICD-10-CM | POA: Diagnosis not present

## 2020-07-02 DIAGNOSIS — N401 Enlarged prostate with lower urinary tract symptoms: Secondary | ICD-10-CM | POA: Diagnosis not present

## 2020-07-02 DIAGNOSIS — Z8551 Personal history of malignant neoplasm of bladder: Secondary | ICD-10-CM | POA: Diagnosis not present

## 2020-07-02 DIAGNOSIS — R3912 Poor urinary stream: Secondary | ICD-10-CM | POA: Diagnosis not present

## 2020-07-06 ENCOUNTER — Other Ambulatory Visit: Payer: Self-pay | Admitting: Urology

## 2020-07-17 ENCOUNTER — Encounter (HOSPITAL_BASED_OUTPATIENT_CLINIC_OR_DEPARTMENT_OTHER): Payer: Self-pay | Admitting: Urology

## 2020-07-17 ENCOUNTER — Other Ambulatory Visit: Payer: Self-pay

## 2020-07-17 DIAGNOSIS — K439 Ventral hernia without obstruction or gangrene: Secondary | ICD-10-CM

## 2020-07-17 DIAGNOSIS — M4802 Spinal stenosis, cervical region: Secondary | ICD-10-CM | POA: Diagnosis not present

## 2020-07-17 DIAGNOSIS — M4722 Other spondylosis with radiculopathy, cervical region: Secondary | ICD-10-CM | POA: Diagnosis not present

## 2020-07-17 DIAGNOSIS — M5412 Radiculopathy, cervical region: Secondary | ICD-10-CM | POA: Diagnosis not present

## 2020-07-17 HISTORY — DX: Ventral hernia without obstruction or gangrene: K43.9

## 2020-07-17 HISTORY — PX: SKIN BIOPSY: SHX1

## 2020-07-17 NOTE — Progress Notes (Addendum)
Spoke w/ via phone for pre-op interview---pt Lab needs dos---istat               Lab results------ekg 10-13-2021chart/epic COVID test ----07-18-2020 945 (overnight stay) NPO after MN NO Solid Food.  Clear liquids from MN until---930 am then npo Med rec completed Medications to take morning of surgery -----flecanide, symbicort, tamsulosin, omeprazole if due,  tylenol prn and let pre op nurse know if took tylenol day of surgery Diabetic medication -----n/a Patient instructed no nail polish to be worn day of surgery Patient instructed to bring photo id and insurance card day of surgery Patient aware to have Driver (ride ) / caregiver  wife claudia  for 24 hours after surgery  Patient Special Instructions -----pt given overnight stay instructions Pre-Op special Istructions ----- Patient verbalized understanding of instructions that were given at this phone interview. Patient denies shortness of breath, chest pain, fever, cough at this phone interview.   Anesthesia Review:hx of htn, paf, venous insucc, factor V, bladder cancer, pt denies any cardiac S & s or sob at pre op call  PCP: dr Herbie [place] gates  Cardiologist : dr Darol Destine 10-26-2019 chart/epic Chest x-ray :08-27-2015 epic EKG : 10-26-2019 chart/epic Echo :09-05-2015 epic Stress test:nuclear 09-09-2015 epic, ett 08-31-2017 epic Cardiac Cath : no Activity level:  does does and yard work, can climb flight of stairs without problem Sleep Study/ CPAP :no Blood Thinner/ Instructions /Last Dose:letter from dr Einar Gip dated 05-14-2020 on chart to stop xarelto 3 days before surgery last dose was 07-16-2020 in pm per pt ASA / Instructions/ Last Dose :  none

## 2020-07-18 ENCOUNTER — Other Ambulatory Visit (HOSPITAL_COMMUNITY)
Admission: RE | Admit: 2020-07-18 | Discharge: 2020-07-18 | Disposition: A | Discharge status: Home / Self Care | Payer: Medicare HMO | Source: Ambulatory Visit | Attending: Urology | Admitting: Urology

## 2020-07-18 DIAGNOSIS — Z01812 Encounter for preprocedural laboratory examination: Secondary | ICD-10-CM | POA: Insufficient documentation

## 2020-07-18 DIAGNOSIS — Z20822 Contact with and (suspected) exposure to covid-19: Secondary | ICD-10-CM | POA: Insufficient documentation

## 2020-07-19 LAB — SARS CORONAVIRUS 2 (TAT 6-24 HRS): SARS Coronavirus 2: NEGATIVE

## 2020-07-20 ENCOUNTER — Ambulatory Visit (HOSPITAL_BASED_OUTPATIENT_CLINIC_OR_DEPARTMENT_OTHER): Payer: Medicare HMO | Admitting: Anesthesiology

## 2020-07-20 ENCOUNTER — Encounter (HOSPITAL_BASED_OUTPATIENT_CLINIC_OR_DEPARTMENT_OTHER)
Admission: RE | Disposition: A | Discharge status: Home / Self Care | Payer: Self-pay | Source: Home / Self Care | Attending: Urology

## 2020-07-20 ENCOUNTER — Encounter (HOSPITAL_BASED_OUTPATIENT_CLINIC_OR_DEPARTMENT_OTHER): Payer: Self-pay | Admitting: Urology

## 2020-07-20 ENCOUNTER — Ambulatory Visit (HOSPITAL_BASED_OUTPATIENT_CLINIC_OR_DEPARTMENT_OTHER)
Admission: RE | Admit: 2020-07-20 | Discharge: 2020-07-20 | Disposition: A | Discharge status: Home / Self Care | Payer: Medicare HMO | Attending: Urology | Admitting: Urology

## 2020-07-20 DIAGNOSIS — K219 Gastro-esophageal reflux disease without esophagitis: Secondary | ICD-10-CM | POA: Diagnosis not present

## 2020-07-20 DIAGNOSIS — Z87891 Personal history of nicotine dependence: Secondary | ICD-10-CM | POA: Diagnosis not present

## 2020-07-20 DIAGNOSIS — N401 Enlarged prostate with lower urinary tract symptoms: Secondary | ICD-10-CM | POA: Diagnosis not present

## 2020-07-20 DIAGNOSIS — Z79899 Other long term (current) drug therapy: Secondary | ICD-10-CM | POA: Diagnosis not present

## 2020-07-20 DIAGNOSIS — N529 Male erectile dysfunction, unspecified: Secondary | ICD-10-CM | POA: Diagnosis not present

## 2020-07-20 DIAGNOSIS — Q541 Hypospadias, penile: Secondary | ICD-10-CM | POA: Diagnosis not present

## 2020-07-20 DIAGNOSIS — Q549 Hypospadias, unspecified: Secondary | ICD-10-CM | POA: Diagnosis not present

## 2020-07-20 DIAGNOSIS — R3912 Poor urinary stream: Secondary | ICD-10-CM | POA: Insufficient documentation

## 2020-07-20 DIAGNOSIS — Q642 Congenital posterior urethral valves: Secondary | ICD-10-CM | POA: Diagnosis not present

## 2020-07-20 DIAGNOSIS — D6851 Activated protein C resistance: Secondary | ICD-10-CM | POA: Insufficient documentation

## 2020-07-20 DIAGNOSIS — Z8551 Personal history of malignant neoplasm of bladder: Secondary | ICD-10-CM | POA: Diagnosis not present

## 2020-07-20 DIAGNOSIS — Z7901 Long term (current) use of anticoagulants: Secondary | ICD-10-CM | POA: Diagnosis not present

## 2020-07-20 HISTORY — PX: THULIUM LASER TURP (TRANSURETHRAL RESECTION OF PROSTATE): SHX6744

## 2020-07-20 HISTORY — DX: Malignant (primary) neoplasm, unspecified: C80.1

## 2020-07-20 LAB — URINALYSIS, ROUTINE W REFLEX MICROSCOPIC
Bilirubin Urine: NEGATIVE
Glucose, UA: NEGATIVE mg/dL
Hgb urine dipstick: NEGATIVE
Ketones, ur: NEGATIVE mg/dL
Leukocytes,Ua: NEGATIVE
Nitrite: NEGATIVE
Protein, ur: NEGATIVE mg/dL
Specific Gravity, Urine: 1.017 (ref 1.005–1.030)
pH: 6 (ref 5.0–8.0)

## 2020-07-20 LAB — POCT I-STAT, CHEM 8
BUN: 19 mg/dL (ref 8–23)
Calcium, Ion: 1.25 mmol/L (ref 1.15–1.40)
Chloride: 101 mmol/L (ref 98–111)
Creatinine, Ser: 1 mg/dL (ref 0.61–1.24)
Glucose, Bld: 111 mg/dL — ABNORMAL HIGH (ref 70–99)
HCT: 47 % (ref 39.0–52.0)
Hemoglobin: 16 g/dL (ref 13.0–17.0)
Potassium: 4.1 mmol/L (ref 3.5–5.1)
Sodium: 136 mmol/L (ref 135–145)
TCO2: 24 mmol/L (ref 22–32)

## 2020-07-20 SURGERY — THULIUM LASER TURP (TRANSURETHRAL RESECTION OF PROSTATE)
Anesthesia: General | Site: Prostate

## 2020-07-20 MED ORDER — PROPOFOL 10 MG/ML IV BOLUS
INTRAVENOUS | Status: DC | PRN
  Administered 2020-07-20: 160 mg via INTRAVENOUS

## 2020-07-20 MED ORDER — ONDANSETRON HCL 4 MG/2ML IJ SOLN
INTRAMUSCULAR | Status: AC
  Filled 2020-07-20: qty 2

## 2020-07-20 MED ORDER — FENTANYL CITRATE (PF) 100 MCG/2ML IJ SOLN
INTRAMUSCULAR | Status: DC | PRN
  Administered 2020-07-20 (×2): 25 ug via INTRAVENOUS
  Administered 2020-07-20: 50 ug via INTRAVENOUS

## 2020-07-20 MED ORDER — PROPOFOL 10 MG/ML IV BOLUS
INTRAVENOUS | Status: AC
  Filled 2020-07-20: qty 20

## 2020-07-20 MED ORDER — EPHEDRINE 5 MG/ML INJ
INTRAVENOUS | Status: AC
  Filled 2020-07-20: qty 10

## 2020-07-20 MED ORDER — ACETAMINOPHEN 500 MG PO TABS
1000.0000 mg | ORAL_TABLET | Freq: Once | ORAL | Status: AC
  Administered 2020-07-20: 1000 mg via ORAL

## 2020-07-20 MED ORDER — FENTANYL CITRATE (PF) 100 MCG/2ML IJ SOLN
INTRAMUSCULAR | Status: AC
  Filled 2020-07-20: qty 2

## 2020-07-20 MED ORDER — DEXAMETHASONE SODIUM PHOSPHATE 10 MG/ML IJ SOLN
INTRAMUSCULAR | Status: DC | PRN
  Administered 2020-07-20: 10 mg via INTRAVENOUS

## 2020-07-20 MED ORDER — LACTATED RINGERS IV SOLN: INTRAVENOUS | Status: DC

## 2020-07-20 MED ORDER — LIDOCAINE HCL (PF) 2 % IJ SOLN
INTRAMUSCULAR | Status: AC
  Filled 2020-07-20: qty 5

## 2020-07-20 MED ORDER — ACETAMINOPHEN 500 MG PO TABS
ORAL_TABLET | ORAL | Status: AC
  Filled 2020-07-20: qty 2

## 2020-07-20 MED ORDER — OXYCODONE HCL 5 MG PO TABS
ORAL_TABLET | ORAL | Status: AC
  Filled 2020-07-20: qty 1

## 2020-07-20 MED ORDER — OXYCODONE HCL 5 MG PO TABS
5.0000 mg | ORAL_TABLET | Freq: Once | ORAL | Status: AC
  Administered 2020-07-20: 5 mg via ORAL

## 2020-07-20 MED ORDER — SODIUM CHLORIDE 0.9 % IR SOLN
Status: DC | PRN
  Administered 2020-07-20: 6000 mL via INTRAVESICAL

## 2020-07-20 MED ORDER — FENTANYL CITRATE (PF) 100 MCG/2ML IJ SOLN: 25.0000 ug | INTRAMUSCULAR | Status: DC | PRN

## 2020-07-20 MED ORDER — DEXAMETHASONE SODIUM PHOSPHATE 10 MG/ML IJ SOLN
INTRAMUSCULAR | Status: AC
  Filled 2020-07-20: qty 1

## 2020-07-20 MED ORDER — ONDANSETRON HCL 4 MG/2ML IJ SOLN
INTRAMUSCULAR | Status: DC | PRN
  Administered 2020-07-20: 4 mg via INTRAVENOUS

## 2020-07-20 MED ORDER — CIPROFLOXACIN IN D5W 400 MG/200ML IV SOLN
INTRAVENOUS | Status: AC
  Filled 2020-07-20: qty 200

## 2020-07-20 MED ORDER — CEPHALEXIN 500 MG PO CAPS: 500.0000 mg | ORAL_CAPSULE | Freq: Two times a day (BID) | ORAL | 0 refills | Status: AC

## 2020-07-20 MED ORDER — EPHEDRINE SULFATE-NACL 50-0.9 MG/10ML-% IV SOSY
PREFILLED_SYRINGE | INTRAVENOUS | Status: DC | PRN
  Administered 2020-07-20: 10 mg via INTRAVENOUS

## 2020-07-20 MED ORDER — LIDOCAINE 2% (20 MG/ML) 5 ML SYRINGE
INTRAMUSCULAR | Status: DC | PRN
  Administered 2020-07-20: 80 mg via INTRAVENOUS

## 2020-07-20 MED ORDER — CIPROFLOXACIN IN D5W 400 MG/200ML IV SOLN
400.0000 mg | Freq: Once | INTRAVENOUS | Status: AC
  Administered 2020-07-20: 400 mg via INTRAVENOUS

## 2020-07-20 SURGICAL SUPPLY — 22 items
BAG DRAIN URO-CYSTO SKYTR STRL (DRAIN) ×2 IMPLANT
BAG DRN RND TRDRP ANRFLXCHMBR (UROLOGICAL SUPPLIES) ×1
BAG DRN UROCATH (DRAIN) ×1
BAG URINE DRAIN 2000ML AR STRL (UROLOGICAL SUPPLIES) ×2 IMPLANT
CATH 18FR 3 WAY 30 (CATHETERS) ×2
CATH 18FR 3WAY 30CC (CATHETERS) ×1 IMPLANT
CATH COUDE FOLEY 2W 5CC 18FR (CATHETERS) IMPLANT
CATH FOLEY 3WAY 30CC 22FR (CATHETERS) IMPLANT
CLOTH BEACON ORANGE TIMEOUT ST (SAFETY) ×2 IMPLANT
GLOVE SURG ENC MOIS LTX SZ7.5 (GLOVE) ×2 IMPLANT
GOWN STRL REUS W/TWL LRG LVL3 (GOWN DISPOSABLE) ×2 IMPLANT
HOLDER FOLEY CATH W/STRAP (MISCELLANEOUS) ×2 IMPLANT
IV NS IRRIG 3000ML ARTHROMATIC (IV SOLUTION) ×2 IMPLANT
KIT TURNOVER CYSTO (KITS) ×2 IMPLANT
LASER REVOLIX PROCEDURE (MISCELLANEOUS) ×2 IMPLANT
LOOP CUT BIPOLAR 24F LRG (ELECTROSURGICAL) IMPLANT
MANIFOLD NEPTUNE II (INSTRUMENTS) ×2 IMPLANT
PACK CYSTO (CUSTOM PROCEDURE TRAY) ×2 IMPLANT
SYR 30ML LL (SYRINGE) ×2 IMPLANT
SYR TOOMEY IRRIG 70ML (MISCELLANEOUS) ×2
SYRINGE TOOMEY IRRIG 70ML (MISCELLANEOUS) ×1 IMPLANT
TUBE CONNECTING 12X1/4 (SUCTIONS) ×2 IMPLANT

## 2020-07-20 NOTE — Op Note (Signed)
Operative Note  Preoperative diagnosis:  1.  BPH with LUTS 2.  Hypospadias 3.  Posterior prostatic urethral valves  Postoperative diagnosis: 1.  BPH with LUTS 2.  Hypospadias 3.  Posterior prostatic urethral valves  Procedure(s): 1.  Cystoscopy with thulium laser resection of the prostate  Surgeon: Ellison Hughs, MD  Assistants:  None  Anesthesia:  General  Complications:  None  EBL: Less than 5 mL  Specimens: 1.  None  Drains/Catheters: 1.  18 French three-way Foley catheter with 10 mL of sterile water in the balloon  Intraoperative findings:   Bilobar prostatic regrowth No intravesical lesions were seen during cystoscopy Posterior prostatic urethral defect from prior posterior urethral valves   Indication:  Christian [name] is a 75 y.o. male with a history of pTa UCC of the bladder (s/p cystoscopy with bladder biopsy on 06/01/19), BPH with lower urinary tract symptoms (status post TURP in 2013). He also has a history of erectile dysfunction, congenital posterior urethral valves, hypospadias, factor V Leiden deficiency (on Xarelto), HTN and a h/o bundle branch block.  The patient reports progressively worsening lower urinary tract symptoms over the past 12 months.  During surveillance cystoscopy, the patient was noted to have  prostatic regrowth partially obstructing his bladder outlet.  He has been consented for the above procedures, voices understanding and wishes to proceed.  Description of procedure:  After informed consent was obtained, the patient was brought to the operating room and general LMA anesthesia was administered. The patient was then placed in the dorsolithotomy position and prepped and draped in the usual sterile fashion. A timeout was performed. A 23 French continuous-flow cystoscope was then inserted into the urethral meatus and advanced into the bladder under direct vision. A complete bladder survey revealed no intravesical pathology.  He was noted to  have a defect/diverticulum in the posterior aspect of the prostatic urethra, consistent with his diagnosis of posterior urethral valves.  A 1000 W thulium laser was then used to progressively ablate the prostatic regrowth seen on the lateral lobes until an adequate urethral channel was created.  There was minimal bleeding during the procedure.  The cystoscope was then removed.  An 61 French three-way Foley catheter was then inserted.  The bladder was extensively hand irrigated and free of debris and/or clot.  The Foley catheter was then placed to gravity drainage.  He tolerated the procedure well and was transferred to the postanesthesia in stable condition.  Plan: Follow-up on 07/25/2020 for catheter removal in the office

## 2020-07-20 NOTE — Anesthesia Procedure Notes (Signed)
Procedure Name: LMA Insertion Date/Time: 07/20/2020 12:56 PM Performed by: Trinetta Alemu D, CRNA Pre-anesthesia Checklist: Patient identified, Emergency Drugs available, Suction available and Patient being monitored Patient Re-evaluated:Patient Re-evaluated prior to induction Oxygen Delivery Method: Circle system utilized Preoxygenation: Pre-oxygenation with 100% oxygen Induction Type: IV induction Ventilation: Mask ventilation without difficulty LMA: LMA inserted LMA Size: 5.0 Tube type: Oral Number of attempts: 1 Placement Confirmation: positive ETCO2 and breath sounds checked- equal and bilateral Tube secured with: Tape Dental Injury: Teeth and Oropharynx as per pre-operative assessment

## 2020-07-20 NOTE — Anesthesia Preprocedure Evaluation (Addendum)
Anesthesia Evaluation  Patient identified by MRN, date of birth, ID band Patient awake    Reviewed: Allergy & Precautions, NPO status , Patient's Chart, lab work & pertinent test results, reviewed documented beta blocker date and time   Airway Mallampati: II  TM Distance: >3 FB Neck ROM: Full    Dental no notable dental hx. (+) Teeth Intact, Dental Advisory Given   Pulmonary asthma , former smoker,    Pulmonary exam normal breath sounds clear to auscultation       Cardiovascular hypertension, Pt. on medications and Pt. on home beta blockers Normal cardiovascular exam+ dysrhythmias Atrial Fibrillation  Rhythm:Regular Rate:Normal  EKG RBBB   Neuro/Psych negative neurological ROS  negative psych ROS   GI/Hepatic Neg liver ROS, GERD  Medicated and Controlled,  Endo/Other  negative endocrine ROS  Renal/GU negative Renal ROS  negative genitourinary   Musculoskeletal  (+) Arthritis ,   Abdominal   Peds  Hematology  (+) Blood dyscrasia (Factor V Leiden on xarelto), ,   Anesthesia Other Findings   Reproductive/Obstetrics                            Anesthesia Physical Anesthesia Plan  ASA: 3  Anesthesia Plan: General   Post-op Pain Management:    Induction: Intravenous  PONV Risk Score and Plan: Midazolam, Dexamethasone and Ondansetron  Airway Management Planned: LMA  Additional Equipment:   Intra-op Plan:   Post-operative Plan: Extubation in OR  Informed Consent: I have reviewed the patients History and Physical, chart, labs and discussed the procedure including the risks, benefits and alternatives for the proposed anesthesia with the patient or authorized representative who has indicated his/her understanding and acceptance.     Dental advisory given  Plan Discussed with: CRNA  Anesthesia Plan Comments:         Anesthesia Quick Evaluation

## 2020-07-20 NOTE — H&P (Signed)
PRE-OP H&P  Office Visit Report     07/02/2020   --------------------------------------------------------------------------------   Christian [name]  MRN: [id number]  DOB: [birth year], 75 year old Male   PRIMARY CARE:  R Marcellus Scott, MD  REFERRING:  Harrell Gave A. Lovena Neighbours, MD  PROVIDER:  Ellison Hughs, M.D.  LOCATION:  [facility] [phone]     --------------------------------------------------------------------------------   CC/HPI: CC: Bladder cancer   HPI: Christian [name] is a 75 year old male with a history of pTa UCC of the bladder (s/p cystoscopy with bladder biopsy on 06/01/19), BPH with lower urinary tract symptoms (status post TURP in 2013). He also has a history of erectile dysfunction, congenital posterior urethral valves, hypospadias, factor V Leiden deficiency (on Xarelto), HTN and a h/o bundle branch block.   -BCG deferred due to adverse reaction  Last PSA- 0.98 (2015)   1. Bladder cancer: The patient is here today for a surveillance cystoscopy. He voiding w/o difficulty and denies interval UTIs, dysuria or hematuria. He does note a slightly worsening FOS and some urgency.   2. BPH: s/p TURP in 2013. Found to have ball-valving median lobe regrowth on surveillance cystoscopy. Currently on tamsulosin BID and finasteride.   Christian [name] is here today for a routine preop visit leading up to his laser TUR. He continues to have bothersome LUTS despite tamsulosin and finasteride. He denies interval UTIs, dysuria or gross hematuria. UA today is clear.     ALLERGIES: BCG - blood clots, flu like symptoms No Allergies    MEDICATIONS: Metoprolol Tartrate 25 mg tablet  Tamsulosin Hcl 0.4 mg capsule 1 capsule PO Q 12 H  Atorvastatin Calcium 10 mg tablet  CoQ10 CAPS 0 Oral  FINASTERIDE Daily  Flecainide Acetate  Fluticasone Propionate 50 mcg/actuation spray, suspension  Levocetirizine Dihydrochloride 5 mg tablet  Losartan-Hydrochlorothiazide 100 mg-12.5 mg tablet   Montelukast Sodium 10 mg tablet  Multi-Vitamin TABS Oral  Sildenafil Citrate 20 mg tablet  Sildenafil Citrate 100 mg tablet 1 tablet PO PRN  SYMBICORT IH Daily  Tramadol Hcl 50 mg tablet tablet PRN  Tylenol 8 Hour 650 mg tablet, extended release  Vitamin D TABS 0 Oral  Xarelto 20 mg tablet  Zolpidem Tartrate PRN     GU PSH: Bladder Instill AntiCA Agent - 07/21/2019 Cysto Bladder Ureth Biopsy - 06/01/2019 Cystoscopy - 04/16/2020, 12/19/2019, 09/12/2019, 05/13/2019 Cystoscopy TURP - 2013 Locm 300-399Mg /Ml Iodine,1Ml - 04/22/2019       PSH Notes: Transurethral Resection Of Prostate (TURP), Cataract Surgery, vein ablation    NON-GU PSH: Cataract Surgery.. Phleb Veins - Extrem - To 20     GU PMH: BPH w/LUTS - 04/16/2020, - 02/13/2020, - 12/19/2019, Benign prostatic hyperplasia with urinary obstruction, - 2016 History of bladder cancer - 04/16/2020, - 12/19/2019, - 09/12/2019 Weak Urinary Stream - 04/16/2020, (Stable), - 12/19/2019 (Stable), - 2018, - 2018 Nocturia (Stable) - 02/13/2020, (Stable), - 2018, Nocturia, - 2015 Acute Cystitis/UTI - 07/28/2019 Bladder Cancer Lateral - 05/13/2019 Hypospadias, Unspec - 05/13/2019 Gross hematuria - 04/07/2019 Other male ED - 2020 Renal cyst - 2017 Male ED, unspecified, Organic erectile dysfunction - 2016 Urinary Frequency, Urinary frequency - 2015 Inflammatory Disease Prostate, Unspec, Prostatitis - 2014 Testicular pain, unspecified, Testicular pain - 2014      PMH Notes:  2006-04-06 13:45:51 - Note: Venous Thrombosis   NON-GU PMH: Encounter for general adult medical examination without abnormal findings, Encounter for preventive health examination - 2016 Atrial Fibrillation Hypertension    FAMILY HISTORY: Factor 5 Leiden mutation, heterozygous -  Runs In Family Family Health Status Number - Runs In Family Heart Rhythm Irregular - Brother, Father, Sister Urologic Disorder - Father Varicose Veins Of Lower Extremities - Mother, Sister   SOCIAL HISTORY:  Marital Status: Married Preferred Language: English; Ethnicity: Not Hispanic Or Latino; Race: White Current Smoking Status: Patient does not smoke anymore. Has not smoked since 12/14/1995.   Tobacco Use Assessment Completed: Used Tobacco in last 30 days? Drinks 2 drinks per day. Types of alcohol consumed: Wine.  Drinks 1 caffeinated drink per day.     Notes: Former smoker, Occupation:, Tobacco Use, Marital History - Currently Married, Alcohol Use, Caffeine Use   REVIEW OF SYSTEMS:    GU Review Male:   Patient reports frequent urination, get up at night to urinate, and erection problems. Patient denies hard to postpone urination, burning/ pain with urination, leakage of urine, stream starts and stops, trouble starting your stream, have to strain to urinate , and penile pain.  Gastrointestinal (Upper):   Patient denies nausea, vomiting, and indigestion/ heartburn.  Gastrointestinal (Lower):   Patient denies diarrhea and constipation.  Constitutional:   Patient denies fever, night sweats, weight loss, and fatigue.  Skin:   Patient denies skin rash/ lesion and itching.  Eyes:   Patient denies blurred vision and double vision.  Ears/ Nose/ Throat:   Patient denies sore throat and sinus problems.  Hematologic/Lymphatic:   Patient denies swollen glands and easy bruising.  Cardiovascular:   Patient denies leg swelling and chest pains.  Respiratory:   Patient denies cough and shortness of breath.  Endocrine:   Patient denies excessive thirst.  Musculoskeletal:   Patient reports back pain and joint pain.   Neurological:   Patient denies headaches and dizziness.  Psychologic:   Patient denies depression and anxiety.   VITAL SIGNS:      07/02/2020 08:27 AM  BP 113/72 mmHg  Heart Rate 55 /min  Temperature 97.7 F / 36.5 C   Complexity of Data:  Records Review:   Previous Patient Records   06/17/13 06/16/12  PSA  Total PSA 0.98  0.98     PROCEDURES:          Urinalysis Dipstick Dipstick  Cont'd  Color: Yellow Bilirubin: Neg mg/dL  Appearance: Clear Ketones: Neg mg/dL  Specific Gravity: 1.020 Blood: Neg ery/uL  pH: 6.0 Protein: Neg mg/dL  Glucose: Neg mg/dL Urobilinogen: 0.2 mg/dL    Nitrites: Neg    Leukocyte Esterase: Neg leu/uL    ASSESSMENT:      ICD-10 Details  1 GU:   BPH w/LUTS - N40.1 Chronic, Stable  2   Weak Urinary Stream - R39.12 Chronic, Stable  3   Hypospadias, Unspec - Q54.9 Chronic, Stable  4   History of bladder cancer - Z85.51      PLAN:           Schedule Return Visit/Planned Activity: Keep Scheduled Appointment          Document Letter(s):  Created for Patient: Clinical Summary         Notes:     The risk, benefits and alternatives of cystoscopy with laser TUR of the prostate was discussed in detail. The risks include, but are not limited to, bleeding, urinary tract infection, bladder perforation requiring prolonged catheterization and/or open bladder repair, ureteral injury, ureteral obstruction, urethral stricture disease, new or worsening voiding dysfunction, retrograde ejaculation, MI, CVA, PE, DVT and the inherent risks of general anesthesia. We also discussed the need for Foley catheterization for  at least 3 days post-op.   -Continue tamsulosin BID and Finasteride

## 2020-07-20 NOTE — Anesthesia Postprocedure Evaluation (Signed)
Anesthesia Post Note  Patient: Christian [name]  Procedure(s) Performed: THULIUM LASER TURP (TRANSURETHRAL RESECTION OF PROSTATE), CYSTOSCOPY (Prostate)     Patient location during evaluation: PACU Anesthesia Type: General Level of consciousness: awake and alert Pain management: pain level controlled Vital Signs Assessment: post-procedure vital signs reviewed and stable Respiratory status: spontaneous breathing, nonlabored ventilation, respiratory function stable and patient connected to nasal cannula oxygen Cardiovascular status: blood pressure returned to baseline and stable Postop Assessment: no apparent nausea or vomiting Anesthetic complications: no   No notable events documented.  Last Vitals:  Vitals:   07/20/20 1416 07/20/20 1509  BP: 139/75 135/74  Pulse: (!) 47 (!) 51  Resp: 13 18  Temp: 36.4 C   SpO2: 100% 100%    Last Pain:  Vitals:   07/20/20 1416  TempSrc: Oral  PainSc:    Pain Goal: Patients Stated Pain Goal: 5 (07/20/20 1059)                 [place]

## 2020-07-20 NOTE — Discharge Instructions (Addendum)

## 2020-07-20 NOTE — Transfer of Care (Signed)
Immediate Anesthesia Transfer of Care Note  Patient: Christian [name]  Procedure(s) Performed: Marcelino Duster LASER TURP (TRANSURETHRAL RESECTION OF PROSTATE), CYSTOSCOPY (Prostate)  Patient Location: PACU  Anesthesia Type:General  Level of Consciousness: awake, alert  and oriented  Airway & Oxygen Therapy: Patient Spontanous Breathing and Patient connected to nasal cannula oxygen  Post-op Assessment: Report given to RN and Post -op Vital signs reviewed and stable  Post vital signs: Reviewed and stable  Last Vitals:  Vitals Value Taken Time  BP 131/76 07/20/20 1345  Temp    Pulse 56 07/20/20 1344  Resp 15 07/20/20 1344  SpO2 100 % 07/20/20 1344  Vitals shown include unvalidated device data.  Last Pain:  Vitals:   07/20/20 1059  TempSrc: Oral  PainSc: 1       Patients Stated Pain Goal: 5 (44/96/75 9163)  Complications: No notable events documented.

## 2020-07-23 ENCOUNTER — Encounter (HOSPITAL_BASED_OUTPATIENT_CLINIC_OR_DEPARTMENT_OTHER): Payer: Self-pay | Admitting: Urology

## 2020-07-23 NOTE — Telephone Encounter (Signed)
From patient.

## 2020-07-31 DIAGNOSIS — M5412 Radiculopathy, cervical region: Secondary | ICD-10-CM | POA: Diagnosis not present

## 2020-08-02 DIAGNOSIS — I10 Essential (primary) hypertension: Secondary | ICD-10-CM | POA: Diagnosis not present

## 2020-08-02 DIAGNOSIS — N401 Enlarged prostate with lower urinary tract symptoms: Secondary | ICD-10-CM | POA: Diagnosis not present

## 2020-08-02 DIAGNOSIS — M47812 Spondylosis without myelopathy or radiculopathy, cervical region: Secondary | ICD-10-CM | POA: Diagnosis not present

## 2020-08-02 DIAGNOSIS — G4700 Insomnia, unspecified: Secondary | ICD-10-CM | POA: Diagnosis not present

## 2020-08-02 DIAGNOSIS — E7800 Pure hypercholesterolemia, unspecified: Secondary | ICD-10-CM | POA: Diagnosis not present

## 2020-08-02 DIAGNOSIS — J40 Bronchitis, not specified as acute or chronic: Secondary | ICD-10-CM | POA: Diagnosis not present

## 2020-08-02 DIAGNOSIS — J42 Unspecified chronic bronchitis: Secondary | ICD-10-CM | POA: Diagnosis not present

## 2020-08-02 DIAGNOSIS — K219 Gastro-esophageal reflux disease without esophagitis: Secondary | ICD-10-CM | POA: Diagnosis not present

## 2020-08-02 DIAGNOSIS — E785 Hyperlipidemia, unspecified: Secondary | ICD-10-CM | POA: Diagnosis not present

## 2020-08-02 DIAGNOSIS — I4891 Unspecified atrial fibrillation: Secondary | ICD-10-CM | POA: Diagnosis not present

## 2020-08-07 ENCOUNTER — Encounter: Payer: Self-pay | Admitting: Cardiology

## 2020-08-10 DIAGNOSIS — R3912 Poor urinary stream: Secondary | ICD-10-CM | POA: Diagnosis not present

## 2020-08-10 DIAGNOSIS — N401 Enlarged prostate with lower urinary tract symptoms: Secondary | ICD-10-CM | POA: Diagnosis not present

## 2020-08-10 DIAGNOSIS — R8279 Other abnormal findings on microbiological examination of urine: Secondary | ICD-10-CM | POA: Diagnosis not present

## 2020-08-14 ENCOUNTER — Encounter: Payer: Self-pay | Admitting: Cardiology

## 2020-08-23 DIAGNOSIS — M5412 Radiculopathy, cervical region: Secondary | ICD-10-CM | POA: Diagnosis not present

## 2020-08-28 DIAGNOSIS — L988 Other specified disorders of the skin and subcutaneous tissue: Secondary | ICD-10-CM | POA: Diagnosis not present

## 2020-08-28 DIAGNOSIS — D485 Neoplasm of uncertain behavior of skin: Secondary | ICD-10-CM | POA: Diagnosis not present

## 2020-09-21 DIAGNOSIS — J40 Bronchitis, not specified as acute or chronic: Secondary | ICD-10-CM | POA: Diagnosis not present

## 2020-09-21 DIAGNOSIS — I4891 Unspecified atrial fibrillation: Secondary | ICD-10-CM | POA: Diagnosis not present

## 2020-09-21 DIAGNOSIS — E7800 Pure hypercholesterolemia, unspecified: Secondary | ICD-10-CM | POA: Diagnosis not present

## 2020-09-21 DIAGNOSIS — G4700 Insomnia, unspecified: Secondary | ICD-10-CM | POA: Diagnosis not present

## 2020-09-21 DIAGNOSIS — K219 Gastro-esophageal reflux disease without esophagitis: Secondary | ICD-10-CM | POA: Diagnosis not present

## 2020-09-21 DIAGNOSIS — J45909 Unspecified asthma, uncomplicated: Secondary | ICD-10-CM | POA: Diagnosis not present

## 2020-09-21 DIAGNOSIS — E785 Hyperlipidemia, unspecified: Secondary | ICD-10-CM | POA: Diagnosis not present

## 2020-09-21 DIAGNOSIS — J42 Unspecified chronic bronchitis: Secondary | ICD-10-CM | POA: Diagnosis not present

## 2020-09-21 DIAGNOSIS — N401 Enlarged prostate with lower urinary tract symptoms: Secondary | ICD-10-CM | POA: Diagnosis not present

## 2020-09-21 DIAGNOSIS — I10 Essential (primary) hypertension: Secondary | ICD-10-CM | POA: Diagnosis not present

## 2020-09-26 DIAGNOSIS — M48061 Spinal stenosis, lumbar region without neurogenic claudication: Secondary | ICD-10-CM | POA: Diagnosis not present

## 2020-09-26 DIAGNOSIS — M5136 Other intervertebral disc degeneration, lumbar region: Secondary | ICD-10-CM | POA: Diagnosis not present

## 2020-10-23 DIAGNOSIS — M5416 Radiculopathy, lumbar region: Secondary | ICD-10-CM | POA: Diagnosis not present

## 2020-10-24 DIAGNOSIS — E7800 Pure hypercholesterolemia, unspecified: Secondary | ICD-10-CM | POA: Diagnosis not present

## 2020-10-24 DIAGNOSIS — I4891 Unspecified atrial fibrillation: Secondary | ICD-10-CM | POA: Diagnosis not present

## 2020-10-24 DIAGNOSIS — N401 Enlarged prostate with lower urinary tract symptoms: Secondary | ICD-10-CM | POA: Diagnosis not present

## 2020-10-24 DIAGNOSIS — I10 Essential (primary) hypertension: Secondary | ICD-10-CM | POA: Diagnosis not present

## 2020-10-24 DIAGNOSIS — G4700 Insomnia, unspecified: Secondary | ICD-10-CM | POA: Diagnosis not present

## 2020-10-24 DIAGNOSIS — N400 Enlarged prostate without lower urinary tract symptoms: Secondary | ICD-10-CM | POA: Diagnosis not present

## 2020-10-24 DIAGNOSIS — J42 Unspecified chronic bronchitis: Secondary | ICD-10-CM | POA: Diagnosis not present

## 2020-10-24 DIAGNOSIS — J40 Bronchitis, not specified as acute or chronic: Secondary | ICD-10-CM | POA: Diagnosis not present

## 2020-10-24 DIAGNOSIS — K219 Gastro-esophageal reflux disease without esophagitis: Secondary | ICD-10-CM | POA: Diagnosis not present

## 2020-10-24 DIAGNOSIS — E785 Hyperlipidemia, unspecified: Secondary | ICD-10-CM | POA: Diagnosis not present

## 2020-10-25 ENCOUNTER — Ambulatory Visit: Payer: Medicare HMO | Admitting: Cardiology

## 2020-11-02 DIAGNOSIS — Z7189 Other specified counseling: Secondary | ICD-10-CM | POA: Diagnosis not present

## 2020-11-12 DIAGNOSIS — M5412 Radiculopathy, cervical region: Secondary | ICD-10-CM | POA: Diagnosis not present

## 2020-11-13 DIAGNOSIS — Z23 Encounter for immunization: Secondary | ICD-10-CM | POA: Diagnosis not present

## 2020-11-19 ENCOUNTER — Other Ambulatory Visit: Payer: Self-pay

## 2020-11-19 ENCOUNTER — Encounter: Payer: Self-pay | Admitting: Cardiology

## 2020-11-19 ENCOUNTER — Ambulatory Visit: Payer: Medicare HMO | Admitting: Cardiology

## 2020-11-19 VITALS — BP 132/76 | HR 58 | Resp 16 | Ht 71.0 in | Wt 226.0 lb

## 2020-11-19 DIAGNOSIS — I480 Paroxysmal atrial fibrillation: Secondary | ICD-10-CM

## 2020-11-19 DIAGNOSIS — I10 Essential (primary) hypertension: Secondary | ICD-10-CM | POA: Diagnosis not present

## 2020-11-19 DIAGNOSIS — I452 Bifascicular block: Secondary | ICD-10-CM

## 2020-11-19 NOTE — Progress Notes (Signed)
Primary Physician/Referring:  Josetta Huddle, MD  Patient ID: Christian [name], male    DOB: 09-Oct-1945, 75 y.o.   MRN: [id number]  Chief Complaint  Patient presents with   Paroxysmal atrial fibrillation    Hypertension   Follow-up   HPI:    Christian [name]  is a 75 y.o. Caucasian male with paroxysmal atrial fibrillation first episode on 08/27/2015, history of bilateral venous insufficiency S/P ablation left leg in 2019, hypertension. He is presently on flecainide and is tolerating this well, post flecainide initiation treadmill stress test was negative for any inducible arrhythmia.  He has chronic underlying bradycardia but remains asymptomatic and had excellent exercise tolerance by stress test in 2019.  He has had a negative nuclear stress test in 2017.  He presents for an annual follow up visit. Since his last visit he has been feeling well from a cardiac standpoint. Denies chest pain, dyspnea, orthopnea, palpitations, syncope.   Past Medical History:  Diagnosis Date   Allergic rhinitis    Anticoagulant long-term use    xarelto--- managed by dr Einar Gip   Arthritis    Asthma    baldder cancer    Benign localized prostatic hyperplasia with lower urinary tract symptoms (LUTS)    last uti march 2022   Bladder tumor    BPH (benign prostatic hypertrophy)    COVID 05/2019   mild symptoms x 2 days all sx resolved   DDD (degenerative disc disease), cervical    DDD (degenerative disc disease), lumbar    Edema of right lower extremity    wears ted hose   Factor V deficiency ([place])    05-26-2019 per pt has never had a dvt or pe but has had severeal episodes of superficial venous thrombosis, none recently   Family history of atrial fibrillation    His brother, sister have A. fib, suspects his father also had A. fib   Family history of factor V deficiency    Frequency of urination    GERD (gastroesophageal reflux disease)    Hematuria    Hernia of abdominal wall 07/17/2020   in stomach  asymptomatic per pt   History of chronic bronchitis    Hypertension    followed by pcp  and dr Einar Gip   Brantley Fling 09-09-2015 low risk with normal perfusion and ef 60%;  ETT 08-31-2017 normal with no evidence ischemia : both in epic)   Nocturia    PAF (paroxysmal atrial fibrillation) [facility])    cardiologist----  dr Einar Gip-- dx 2017   RBBB (right bundle branch block)    Varicose veins of right lower extremity with pain    Venous insufficiency of both lower extremities    vascular--- dr fields--  right > left ;  s/p  ablation and phlebectomies   Wears glasses    Past Surgical History:  Procedure Laterality Date   CATARACT EXTRACTION W/ INTRAOCULAR LENS  IMPLANT, BILATERAL Bilateral 2014   COLONOSCOPY N/A 06/27/2014   Procedure: COLONOSCOPY;  Surgeon: Garlan Fair, MD;  Location: [facility];  Service: Endoscopy;  Laterality: N/A;   CYSTOSCOPY WITH FULGERATION N/A 06/01/2019   Procedure: CYSTOSCOPY WITH BLADDER BIOPSY/ FULGERATION;  Surgeon: Ceasar Mons, MD;  Location: [facility];  Service: Urology;  Laterality: N/A;   ENDOVENOUS ABLATION SAPHENOUS VEIN W/ LASER Right 06/30/2016   EVLA R GSV by Tinnie Gens MD    ENDOVENOUS ABLATION SAPHENOUS VEIN W/ LASER Left 07/14/2016   endovenous laser ablation Left greater saphenous vein by  Tinnie Gens MD    REPAIR CONGENITAL URETHRAL DEFECT  INFANT   SKIN BIOPSY  07/17/2020   right knee biopsy of severe atypsia x 3 bx surgery planned for 08-2020   stab phlebectomy Left 11/24/2016   stab phlebectomy > 20 incisions left leg by Tinnie Gens MD    stab phlebectomy  Right `12-29-2016   stab phlebectomy > 20 incisions right leg by Tinnie Gens MD    THULIUM LASER TURP (TRANSURETHRAL RESECTION OF PROSTATE) N/A 07/20/2020   Procedure: Marcelino Duster LASER TURP (TRANSURETHRAL RESECTION OF PROSTATE), CYSTOSCOPY;  Surgeon: Ceasar Mons, MD;  Location: [facility];  Service: Urology;  Laterality: N/A;  ONLY  NEEDS 60 MIN   TRANSURETHRAL RESECTION OF PROSTATE  03/10/2011   Procedure: TRANSURETHRAL RESECTION OF THE PROSTATE WITH GYRUS INSTRUMENTS;  Surgeon: Bernestine Amass, MD;  Location: [facility];  Service: Urology;  Laterality: N/A;  [place] TURP OWER    Social History   Tobacco Use   Smoking status: Former    Packs/day: 1.00    Years: 20.00    Pack years: 20.00    Types: Cigarettes    Quit date: 02/28/1995    Years since quitting: 25.7   Smokeless tobacco: Never  Substance Use Topics   Alcohol use: Yes    Alcohol/week: 14.0 standard drinks    Types: 14 Shots of liquor per week    Comment: 2 or 3 drinks daily  Marital Status: Married  ROS  Review of Systems  Cardiovascular:  Positive for leg swelling (mild, bilateral ankles). Negative for chest pain, dyspnea on exertion, palpitations and syncope.  Objective   Vitals with BMI 11/19/2020 07/20/2020 07/20/2020  Height 5\' 11"  - -  Weight 226 lbs - -  BMI 01.60 - -  Systolic 109 323 557  Diastolic 76 74 75  Pulse 58 51 47    Blood pressure 132/76, pulse (!) 58, resp. rate 16, height 5\' 11"  (1.803 m), weight 226 lb (102.5 kg), SpO2 97 %. Body mass index is 31.52 kg/m.   Physical Exam Constitutional:      Appearance: He is well-developed.     Comments: Mildly obese  Neck:     Vascular: No carotid bruit or JVD.  Cardiovascular:     Rate and Rhythm: Regular rhythm. Bradycardia present.     Pulses: Normal pulses and intact distal pulses.     Heart sounds: Normal heart sounds. No murmur heard.   No gallop.  Pulmonary:     Effort: Pulmonary effort is normal.     Breath sounds: Normal breath sounds.  Abdominal:     General: Bowel sounds are normal.     Palpations: Abdomen is soft.  Musculoskeletal:     Right lower leg: Edema (Bilateral lower extremity varicose veins noted, right worse. 1-2+ ankle edema) present.     Left lower leg: Edema (Bilateral lower extremity varicose veins noted, right worse.. 1+ ankle  edema) present.   Radiology: No results found.  Laboratory examination:   Recent Labs    07/20/20 1108  NA 136  K 4.1  CL 101  GLUCOSE 111*  BUN 19  CREATININE 1.00   CMP Latest Ref Rng & Units 07/20/2020 06/01/2019 08/27/2015  Glucose 70 - 99 mg/dL 111(H) 107(H) 99  BUN 8 - 23 mg/dL 19 22 20   Creatinine 0.61 - 1.24 mg/dL 1.00 1.00 1.17  Sodium 135 - 145 mmol/L 136 139 139  Potassium 3.5 - 5.1 mmol/L 4.1 4.5 4.0  Chloride 98 - 111 mmol/L 101 102 108  CO2 22 - 32 mmol/L - - 22  Calcium 8.9 - 10.3 mg/dL - - 9.3   CBC Latest Ref Rng & Units 07/20/2020 06/01/2019 08/27/2015  WBC 4.0 - 10.5 K/uL - - 8.3  Hemoglobin 13.0 - 17.0 g/dL 16.0 16.0 14.4  Hematocrit 39.0 - 52.0 % 47.0 47.0 43.4  Platelets 150 - 400 K/uL - - 278   External Labs: Cholesterol, total 165.000 m 12/20/2019 HDL 60.000 mg 12/20/2019 LDL 87.000 mg 12/20/2019 Triglycerides 93.000 mg 12/20/2019  A1C 6.200 % 12/20/2019  TSH 1.860 11/24/2018  Medications   Allergies  Allergen Reactions   Gin [Alcohol] Other (See Comments)    "Pins and needles feeling and blotches on my neck"   Other     Bcg caused blood clot and flu like symptoms   Adhesive [Tape] Other (See Comments)    Redness/ irritation   Lobster [Shellfish Allergy] Hives    And per pt positive Allergy test    Current Outpatient Medications on File Prior to Visit  Medication Sig Dispense Refill   acetaminophen (TYLENOL) 650 MG CR tablet Take 1,300 mg by mouth in the morning and at bedtime.     atorvastatin (LIPITOR) 10 MG tablet TAKE 1 TABLET BY MOUTH EVERY DAY 90 tablet 2   Cholecalciferol 125 MCG (5000 UT) capsule Take 5,000 Units by mouth at bedtime.      Coenzyme Q10 (COQ10) 100 MG CAPS Take 1 capsule by mouth at bedtime.     finasteride (PROSCAR) 5 MG tablet Take 5 mg by mouth at bedtime.      flecainide (TAMBOCOR) 100 MG tablet TAKE ONE TABLET BY MOUTH AT BREAKFAST AND AT BEDTIME 180 tablet 2   fluticasone (FLONASE) 50 MCG/ACT nasal spray Place 1-2  sprays into both nostrils daily.     levocetirizine (XYZAL) 5 MG tablet Take 5 mg by mouth at bedtime.     losartan-hydrochlorothiazide (HYZAAR) 100-12.5 MG tablet Take 1 tablet by mouth daily. Currently taking losartan 100 mg and hctz 12. 5mg      melatonin 5 MG TABS Take 5 mg by mouth at bedtime.     metoprolol tartrate (LOPRESSOR) 25 MG tablet Take 25 mg by mouth 2 (two) times daily.     montelukast (SINGULAIR) 10 MG tablet Take 10 mg by mouth at bedtime.  3   Multiple Vitamins-Minerals (CENTRUM SILVER PO) Take 1 tablet by mouth at bedtime.      omeprazole (PRILOSEC) 20 MG capsule Take 20 mg by mouth every other day. At in am     sildenafil (VIAGRA) 50 MG tablet Take 20 mg by mouth at bedtime. Qhs also takes prn also     SYMBICORT 160-4.5 MCG/ACT inhaler Inhale 2 puffs into the lungs in the morning and at bedtime.      tamsulosin (FLOMAX) 0.4 MG CAPS capsule Take 0.4 mg by mouth in the morning and at bedtime.      traMADol (ULTRAM) 50 MG tablet Take 50 mg by mouth at bedtime.     XARELTO 20 MG TABS tablet TAKE ONE TABLET BY MOUTH EVERY EVENING 90 tablet 1   zolpidem (AMBIEN) 5 MG tablet Take 5 mg by mouth at bedtime as needed for sleep.      No current facility-administered medications on file prior to visit.    Cardiac Studies:   Echocardiogram 09/05/2015: Left ventricle cavity is normal in size. Mild concentric hypertrophy of the left ventricle. Normal global wall motion. Normal  diastolic filling pattern. Calculated EF 55%. Left atrial cavity is moderately dilated at 4.5 cm. Trace mitral regurgitation. Mild tricuspid regurgitation. No evidence of pulmonary hypertension.  Lexiscan myoview stress test 09/03/2015: 1. Resting EKG demonstrated normal sinus rhythm, leftward axis, right bundle branch block.  Low-voltage complexes.  Stress EKG was nondiagnostic for ischemia as this is a pharmacologic stress test.  Patient attempt treadmill stress testing, however unable to complete stress test  due to marked exercise intolerance. 2. The perfusion imaging study demonstrates mild gut uptake artifact in the inferior wall without any demonstrable ischemia or scar.  Left ventricular systolic function calculated by QGS was 60%. This is a low risk study.   Exercise Treadmill Stress Test 08/31/2017:  Indication: SoB  The patient exercised on Bruce protocol for  10:05 min. Patient achieved  10.16 METS and reached HR  124 bpm, which is   82% of maximum age-predicted HR.  Stress test terminated due to Fatigue.   Exercise capacity was normal. HR Response to Exercise: Attenuated secondary to medication (metoprololol 25 mg bid, last dose 15 hours ago. ) BP Response to Exercise: Normal resting BP- appropriate response. Chest Pain: none. Arrhythmias: none. Resting EKG demonstrates RBBB with ST-T changes in precordial peads related to RBBB Exercise EKG: Sinus tachycardia with RBBB with ST-T changes in precordial peads related to RBBB. No ischemic cahnges seen.  Overall Impression: Submaximal stress test with no ishcmic sympyoms or EKG changes Continue primary/secondary prevention. Consider alterante etiology for shortness of breath, or  further cardiac workup if pre test probability for CAD is high.  EKG:   EKG 11/19/2020: Sinus bradycardia at 52 bpm, left atrial enlargement, left axis deviation, left anterior fascicular block.  Right bundle branch block.  Bifascicular block.  No evidence of ischemia, normal QT interval.  No significant change from prior EKG 10/26/2019.  Assessment     ICD-10-CM   1. Paroxysmal atrial fibrillation (HCC)  I48.0 EKG 12-Lead    2. Essential hypertension  I10     3. Bifascicular block  I45.2       CHA2DS2-VASc Score is 2.  Yearly risk of stroke: 2.3% (A, HTN).  Score of 1=0.6; 2=2.2; 3=3.2; 4=4.8; 5=7.2; 6=9.8; 7=>9.8) -(CHF; HTN; vasc disease DM,  Male = 1; Age <65 =0; 65-74 = 1,  >75 =2; stroke/embolism= 2).   There are no discontinued medications.  No  orders of the defined types were placed in this encounter.  Recommendations:   Christian [name] is a 75 y.o. male with a history of paroxysmal atrial fibrillation, presently on flecainide 100 mg p.o. b.i.d, history of bilateral venous insufficiency S/P ablation left leg in 2019, hypertension. He continues to have asymptomatic sinus bradycardia and change from 2017 through now. A treadmill exercise stress test in September 2020 with excellent exercise tolerance but chronotropic incompetence probably related to his antiarrhythmic drugs.  The patient presents for annual follow up for paroxysmal atrial fibrillation. He has been essentially asymptomatic from a cardiac standpoint. He is on flecainide and tolerating this well, anticoagulated on Xarelto without bleeding diathesis. EKG today shows sinus bradycardia.  I have discussed with him regarding the risks of bleeding complications associated with Xarelto versus risk of stroke, his chads vascular score is fairly low but he is tolerating Xarelto and he would prefer to continue taking the same for now.  With regard to hypertension, blood pressures well controlled.  No change in his physical exam.  We have discussed regarding calorie restriction as well.  On EKG  he does have bifascicular block but this is remained unchanged and he remains asymptomatic although he has baseline bradycardia related to flecainide.  In January 2023, he and his wife are planning on a [place] cruise.  We discussed regarding pill in the pocket approach for A. fib.  Otherwise remained stable, reviewed his external labs, I will see him back on an annual basis.     Adrian Prows, MD, [facility] 11/19/2020, 3:23 PM Office: [phone]

## 2020-11-22 DIAGNOSIS — Z8551 Personal history of malignant neoplasm of bladder: Secondary | ICD-10-CM | POA: Diagnosis not present

## 2020-11-22 DIAGNOSIS — N401 Enlarged prostate with lower urinary tract symptoms: Secondary | ICD-10-CM | POA: Diagnosis not present

## 2020-11-22 DIAGNOSIS — R351 Nocturia: Secondary | ICD-10-CM | POA: Diagnosis not present

## 2020-12-12 ENCOUNTER — Other Ambulatory Visit: Payer: Self-pay | Admitting: Cardiology

## 2020-12-12 DIAGNOSIS — M5412 Radiculopathy, cervical region: Secondary | ICD-10-CM | POA: Diagnosis not present

## 2020-12-12 DIAGNOSIS — M5416 Radiculopathy, lumbar region: Secondary | ICD-10-CM | POA: Diagnosis not present

## 2020-12-12 DIAGNOSIS — I10 Essential (primary) hypertension: Secondary | ICD-10-CM | POA: Diagnosis not present

## 2020-12-12 DIAGNOSIS — I480 Paroxysmal atrial fibrillation: Secondary | ICD-10-CM

## 2020-12-12 DIAGNOSIS — Z6831 Body mass index (BMI) 31.0-31.9, adult: Secondary | ICD-10-CM | POA: Diagnosis not present

## 2020-12-12 NOTE — Telephone Encounter (Signed)
Okay to refill Flecainide?

## 2020-12-13 DIAGNOSIS — Z23 Encounter for immunization: Secondary | ICD-10-CM | POA: Diagnosis not present

## 2020-12-17 DIAGNOSIS — H04123 Dry eye syndrome of bilateral lacrimal glands: Secondary | ICD-10-CM | POA: Diagnosis not present

## 2020-12-17 DIAGNOSIS — Z961 Presence of intraocular lens: Secondary | ICD-10-CM | POA: Diagnosis not present

## 2020-12-17 DIAGNOSIS — D3131 Benign neoplasm of right choroid: Secondary | ICD-10-CM | POA: Diagnosis not present

## 2020-12-17 DIAGNOSIS — Z0100 Encounter for examination of eyes and vision without abnormal findings: Secondary | ICD-10-CM | POA: Diagnosis not present

## 2020-12-20 DIAGNOSIS — L821 Other seborrheic keratosis: Secondary | ICD-10-CM | POA: Diagnosis not present

## 2020-12-20 DIAGNOSIS — D485 Neoplasm of uncertain behavior of skin: Secondary | ICD-10-CM | POA: Diagnosis not present

## 2020-12-20 DIAGNOSIS — L570 Actinic keratosis: Secondary | ICD-10-CM | POA: Diagnosis not present

## 2020-12-20 DIAGNOSIS — D044 Carcinoma in situ of skin of scalp and neck: Secondary | ICD-10-CM | POA: Diagnosis not present

## 2020-12-25 DIAGNOSIS — I4891 Unspecified atrial fibrillation: Secondary | ICD-10-CM | POA: Diagnosis not present

## 2020-12-25 DIAGNOSIS — J45909 Unspecified asthma, uncomplicated: Secondary | ICD-10-CM | POA: Diagnosis not present

## 2020-12-25 DIAGNOSIS — K219 Gastro-esophageal reflux disease without esophagitis: Secondary | ICD-10-CM | POA: Diagnosis not present

## 2020-12-25 DIAGNOSIS — I10 Essential (primary) hypertension: Secondary | ICD-10-CM | POA: Diagnosis not present

## 2020-12-25 DIAGNOSIS — J40 Bronchitis, not specified as acute or chronic: Secondary | ICD-10-CM | POA: Diagnosis not present

## 2020-12-25 DIAGNOSIS — E785 Hyperlipidemia, unspecified: Secondary | ICD-10-CM | POA: Diagnosis not present

## 2020-12-25 DIAGNOSIS — N400 Enlarged prostate without lower urinary tract symptoms: Secondary | ICD-10-CM | POA: Diagnosis not present

## 2020-12-25 DIAGNOSIS — E7800 Pure hypercholesterolemia, unspecified: Secondary | ICD-10-CM | POA: Diagnosis not present

## 2020-12-25 DIAGNOSIS — J42 Unspecified chronic bronchitis: Secondary | ICD-10-CM | POA: Diagnosis not present

## 2020-12-25 DIAGNOSIS — G4700 Insomnia, unspecified: Secondary | ICD-10-CM | POA: Diagnosis not present

## 2020-12-27 DIAGNOSIS — D6869 Other thrombophilia: Secondary | ICD-10-CM | POA: Diagnosis not present

## 2020-12-27 DIAGNOSIS — R7309 Other abnormal glucose: Secondary | ICD-10-CM | POA: Diagnosis not present

## 2020-12-27 DIAGNOSIS — G4700 Insomnia, unspecified: Secondary | ICD-10-CM | POA: Diagnosis not present

## 2020-12-27 DIAGNOSIS — N401 Enlarged prostate with lower urinary tract symptoms: Secondary | ICD-10-CM | POA: Diagnosis not present

## 2020-12-27 DIAGNOSIS — M47812 Spondylosis without myelopathy or radiculopathy, cervical region: Secondary | ICD-10-CM | POA: Diagnosis not present

## 2020-12-27 DIAGNOSIS — I10 Essential (primary) hypertension: Secondary | ICD-10-CM | POA: Diagnosis not present

## 2020-12-27 DIAGNOSIS — Z79899 Other long term (current) drug therapy: Secondary | ICD-10-CM | POA: Diagnosis not present

## 2020-12-27 DIAGNOSIS — E559 Vitamin D deficiency, unspecified: Secondary | ICD-10-CM | POA: Diagnosis not present

## 2020-12-27 DIAGNOSIS — J45909 Unspecified asthma, uncomplicated: Secondary | ICD-10-CM | POA: Diagnosis not present

## 2020-12-27 DIAGNOSIS — Z0000 Encounter for general adult medical examination without abnormal findings: Secondary | ICD-10-CM | POA: Diagnosis not present

## 2020-12-27 DIAGNOSIS — R7303 Prediabetes: Secondary | ICD-10-CM | POA: Diagnosis not present

## 2020-12-27 DIAGNOSIS — Z1389 Encounter for screening for other disorder: Secondary | ICD-10-CM | POA: Diagnosis not present

## 2020-12-27 DIAGNOSIS — I4891 Unspecified atrial fibrillation: Secondary | ICD-10-CM | POA: Diagnosis not present

## 2020-12-27 DIAGNOSIS — E7800 Pure hypercholesterolemia, unspecified: Secondary | ICD-10-CM | POA: Diagnosis not present

## 2020-12-27 DIAGNOSIS — Z125 Encounter for screening for malignant neoplasm of prostate: Secondary | ICD-10-CM | POA: Diagnosis not present

## 2021-01-02 DIAGNOSIS — C801 Malignant (primary) neoplasm, unspecified: Secondary | ICD-10-CM | POA: Diagnosis not present

## 2021-01-02 DIAGNOSIS — M7061 Trochanteric bursitis, right hip: Secondary | ICD-10-CM | POA: Diagnosis not present

## 2021-01-02 DIAGNOSIS — C4442 Squamous cell carcinoma of skin of scalp and neck: Secondary | ICD-10-CM | POA: Diagnosis not present

## 2021-01-10 DIAGNOSIS — M6281 Muscle weakness (generalized): Secondary | ICD-10-CM | POA: Diagnosis not present

## 2021-01-10 DIAGNOSIS — M7061 Trochanteric bursitis, right hip: Secondary | ICD-10-CM | POA: Diagnosis not present

## 2021-02-07 DIAGNOSIS — M5410 Radiculopathy, site unspecified: Secondary | ICD-10-CM | POA: Diagnosis not present

## 2021-02-24 DIAGNOSIS — M2550 Pain in unspecified joint: Secondary | ICD-10-CM | POA: Diagnosis not present

## 2021-02-24 DIAGNOSIS — M5410 Radiculopathy, site unspecified: Secondary | ICD-10-CM | POA: Diagnosis not present

## 2021-03-18 DIAGNOSIS — M5410 Radiculopathy, site unspecified: Secondary | ICD-10-CM | POA: Diagnosis not present

## 2021-03-18 DIAGNOSIS — Z760 Encounter for issue of repeat prescription: Secondary | ICD-10-CM | POA: Diagnosis not present

## 2021-04-02 DIAGNOSIS — M5410 Radiculopathy, site unspecified: Secondary | ICD-10-CM | POA: Diagnosis not present

## 2021-04-02 DIAGNOSIS — J069 Acute upper respiratory infection, unspecified: Secondary | ICD-10-CM | POA: Diagnosis not present

## 2021-04-20 DIAGNOSIS — M5410 Radiculopathy, site unspecified: Secondary | ICD-10-CM | POA: Diagnosis not present

## 2021-04-20 DIAGNOSIS — Z020 Encounter for examination for admission to educational institution: Secondary | ICD-10-CM | POA: Diagnosis not present

## 2021-05-13 DIAGNOSIS — M5416 Radiculopathy, lumbar region: Secondary | ICD-10-CM | POA: Diagnosis not present

## 2021-05-16 DIAGNOSIS — M5416 Radiculopathy, lumbar region: Secondary | ICD-10-CM | POA: Diagnosis not present

## 2021-05-20 DIAGNOSIS — N400 Enlarged prostate without lower urinary tract symptoms: Secondary | ICD-10-CM | POA: Diagnosis not present

## 2021-05-20 DIAGNOSIS — Z8551 Personal history of malignant neoplasm of bladder: Secondary | ICD-10-CM | POA: Diagnosis not present

## 2021-05-23 DIAGNOSIS — D1722 Benign lipomatous neoplasm of skin and subcutaneous tissue of left arm: Secondary | ICD-10-CM | POA: Diagnosis not present

## 2021-05-23 DIAGNOSIS — D2272 Melanocytic nevi of left lower limb, including hip: Secondary | ICD-10-CM | POA: Diagnosis not present

## 2021-05-23 DIAGNOSIS — L578 Other skin changes due to chronic exposure to nonionizing radiation: Secondary | ICD-10-CM | POA: Diagnosis not present

## 2021-05-23 DIAGNOSIS — L821 Other seborrheic keratosis: Secondary | ICD-10-CM | POA: Diagnosis not present

## 2021-05-23 DIAGNOSIS — D485 Neoplasm of uncertain behavior of skin: Secondary | ICD-10-CM | POA: Diagnosis not present

## 2021-05-23 DIAGNOSIS — L570 Actinic keratosis: Secondary | ICD-10-CM | POA: Diagnosis not present

## 2021-05-23 DIAGNOSIS — L723 Sebaceous cyst: Secondary | ICD-10-CM | POA: Diagnosis not present

## 2021-05-23 DIAGNOSIS — T148XXA Other injury of unspecified body region, initial encounter: Secondary | ICD-10-CM | POA: Diagnosis not present

## 2021-05-27 DIAGNOSIS — Z0100 Encounter for examination of eyes and vision without abnormal findings: Secondary | ICD-10-CM | POA: Diagnosis not present

## 2021-06-11 DIAGNOSIS — R234 Changes in skin texture: Secondary | ICD-10-CM | POA: Diagnosis not present

## 2021-06-11 DIAGNOSIS — B356 Tinea cruris: Secondary | ICD-10-CM | POA: Diagnosis not present

## 2021-06-18 DIAGNOSIS — R059 Cough, unspecified: Secondary | ICD-10-CM | POA: Diagnosis not present

## 2021-06-18 DIAGNOSIS — J3089 Other allergic rhinitis: Secondary | ICD-10-CM | POA: Diagnosis not present

## 2021-06-18 DIAGNOSIS — H1045 Other chronic allergic conjunctivitis: Secondary | ICD-10-CM | POA: Diagnosis not present

## 2021-06-18 DIAGNOSIS — M48061 Spinal stenosis, lumbar region without neurogenic claudication: Secondary | ICD-10-CM | POA: Diagnosis not present

## 2021-06-18 DIAGNOSIS — J301 Allergic rhinitis due to pollen: Secondary | ICD-10-CM | POA: Diagnosis not present

## 2021-06-20 DIAGNOSIS — M48061 Spinal stenosis, lumbar region without neurogenic claudication: Secondary | ICD-10-CM | POA: Diagnosis not present

## 2021-07-02 DIAGNOSIS — J42 Unspecified chronic bronchitis: Secondary | ICD-10-CM | POA: Diagnosis not present

## 2021-07-02 DIAGNOSIS — M545 Low back pain, unspecified: Secondary | ICD-10-CM | POA: Diagnosis not present

## 2021-07-02 DIAGNOSIS — Z23 Encounter for immunization: Secondary | ICD-10-CM | POA: Diagnosis not present

## 2021-07-02 DIAGNOSIS — M47812 Spondylosis without myelopathy or radiculopathy, cervical region: Secondary | ICD-10-CM | POA: Diagnosis not present

## 2021-07-02 DIAGNOSIS — L820 Inflamed seborrheic keratosis: Secondary | ICD-10-CM | POA: Diagnosis not present

## 2021-07-02 DIAGNOSIS — N401 Enlarged prostate with lower urinary tract symptoms: Secondary | ICD-10-CM | POA: Diagnosis not present

## 2021-07-02 DIAGNOSIS — M48061 Spinal stenosis, lumbar region without neurogenic claudication: Secondary | ICD-10-CM | POA: Diagnosis not present

## 2021-07-04 DIAGNOSIS — K219 Gastro-esophageal reflux disease without esophagitis: Secondary | ICD-10-CM | POA: Diagnosis not present

## 2021-07-04 DIAGNOSIS — I10 Essential (primary) hypertension: Secondary | ICD-10-CM | POA: Diagnosis not present

## 2021-07-04 DIAGNOSIS — Z008 Encounter for other general examination: Secondary | ICD-10-CM | POA: Diagnosis not present

## 2021-07-04 DIAGNOSIS — Z6832 Body mass index (BMI) 32.0-32.9, adult: Secondary | ICD-10-CM | POA: Diagnosis not present

## 2021-07-04 DIAGNOSIS — N400 Enlarged prostate without lower urinary tract symptoms: Secondary | ICD-10-CM | POA: Diagnosis not present

## 2021-07-04 DIAGNOSIS — J449 Chronic obstructive pulmonary disease, unspecified: Secondary | ICD-10-CM | POA: Diagnosis not present

## 2021-07-04 DIAGNOSIS — G4700 Insomnia, unspecified: Secondary | ICD-10-CM | POA: Diagnosis not present

## 2021-07-04 DIAGNOSIS — E785 Hyperlipidemia, unspecified: Secondary | ICD-10-CM | POA: Diagnosis not present

## 2021-07-04 DIAGNOSIS — I951 Orthostatic hypotension: Secondary | ICD-10-CM | POA: Diagnosis not present

## 2021-07-04 DIAGNOSIS — I4891 Unspecified atrial fibrillation: Secondary | ICD-10-CM | POA: Diagnosis not present

## 2021-07-04 DIAGNOSIS — N529 Male erectile dysfunction, unspecified: Secondary | ICD-10-CM | POA: Diagnosis not present

## 2021-07-04 DIAGNOSIS — M1990 Unspecified osteoarthritis, unspecified site: Secondary | ICD-10-CM | POA: Diagnosis not present

## 2021-07-04 DIAGNOSIS — M48061 Spinal stenosis, lumbar region without neurogenic claudication: Secondary | ICD-10-CM | POA: Diagnosis not present

## 2021-07-04 DIAGNOSIS — E669 Obesity, unspecified: Secondary | ICD-10-CM | POA: Diagnosis not present

## 2021-07-08 DIAGNOSIS — M48061 Spinal stenosis, lumbar region without neurogenic claudication: Secondary | ICD-10-CM | POA: Diagnosis not present

## 2021-07-17 DIAGNOSIS — M48061 Spinal stenosis, lumbar region without neurogenic claudication: Secondary | ICD-10-CM | POA: Diagnosis not present

## 2021-07-18 ENCOUNTER — Other Ambulatory Visit: Payer: Self-pay | Admitting: Cardiology

## 2021-07-23 DIAGNOSIS — M48061 Spinal stenosis, lumbar region without neurogenic claudication: Secondary | ICD-10-CM | POA: Diagnosis not present

## 2021-07-25 DIAGNOSIS — M48061 Spinal stenosis, lumbar region without neurogenic claudication: Secondary | ICD-10-CM | POA: Diagnosis not present

## 2021-07-30 DIAGNOSIS — M48061 Spinal stenosis, lumbar region without neurogenic claudication: Secondary | ICD-10-CM | POA: Diagnosis not present

## 2021-08-01 DIAGNOSIS — M48061 Spinal stenosis, lumbar region without neurogenic claudication: Secondary | ICD-10-CM | POA: Diagnosis not present

## 2021-08-07 DIAGNOSIS — M48061 Spinal stenosis, lumbar region without neurogenic claudication: Secondary | ICD-10-CM | POA: Diagnosis not present

## 2021-08-07 DIAGNOSIS — M1712 Unilateral primary osteoarthritis, left knee: Secondary | ICD-10-CM | POA: Diagnosis not present

## 2021-08-09 DIAGNOSIS — M48061 Spinal stenosis, lumbar region without neurogenic claudication: Secondary | ICD-10-CM | POA: Diagnosis not present

## 2021-08-13 DIAGNOSIS — M48061 Spinal stenosis, lumbar region without neurogenic claudication: Secondary | ICD-10-CM | POA: Diagnosis not present

## 2021-08-15 DIAGNOSIS — M48061 Spinal stenosis, lumbar region without neurogenic claudication: Secondary | ICD-10-CM | POA: Diagnosis not present

## 2021-08-27 DIAGNOSIS — L821 Other seborrheic keratosis: Secondary | ICD-10-CM | POA: Diagnosis not present

## 2021-08-27 DIAGNOSIS — L570 Actinic keratosis: Secondary | ICD-10-CM | POA: Diagnosis not present

## 2021-08-27 DIAGNOSIS — L820 Inflamed seborrheic keratosis: Secondary | ICD-10-CM | POA: Diagnosis not present

## 2021-08-27 DIAGNOSIS — M48061 Spinal stenosis, lumbar region without neurogenic claudication: Secondary | ICD-10-CM | POA: Diagnosis not present

## 2021-08-27 DIAGNOSIS — D485 Neoplasm of uncertain behavior of skin: Secondary | ICD-10-CM | POA: Diagnosis not present

## 2021-08-30 DIAGNOSIS — M48061 Spinal stenosis, lumbar region without neurogenic claudication: Secondary | ICD-10-CM | POA: Diagnosis not present

## 2021-09-17 DIAGNOSIS — L304 Erythema intertrigo: Secondary | ICD-10-CM | POA: Diagnosis not present

## 2021-09-17 DIAGNOSIS — T50905A Adverse effect of unspecified drugs, medicaments and biological substances, initial encounter: Secondary | ICD-10-CM | POA: Diagnosis not present

## 2021-09-17 DIAGNOSIS — L821 Other seborrheic keratosis: Secondary | ICD-10-CM | POA: Diagnosis not present

## 2021-09-26 DIAGNOSIS — Z6831 Body mass index (BMI) 31.0-31.9, adult: Secondary | ICD-10-CM | POA: Diagnosis not present

## 2021-09-26 DIAGNOSIS — M5416 Radiculopathy, lumbar region: Secondary | ICD-10-CM | POA: Diagnosis not present

## 2021-10-08 DIAGNOSIS — M545 Low back pain, unspecified: Secondary | ICD-10-CM | POA: Diagnosis not present

## 2021-10-08 DIAGNOSIS — M5416 Radiculopathy, lumbar region: Secondary | ICD-10-CM | POA: Diagnosis not present

## 2021-10-08 DIAGNOSIS — M47816 Spondylosis without myelopathy or radiculopathy, lumbar region: Secondary | ICD-10-CM | POA: Diagnosis not present

## 2021-10-18 DIAGNOSIS — L820 Inflamed seborrheic keratosis: Secondary | ICD-10-CM | POA: Diagnosis not present

## 2021-10-18 DIAGNOSIS — L304 Erythema intertrigo: Secondary | ICD-10-CM | POA: Diagnosis not present

## 2021-10-18 DIAGNOSIS — B372 Candidiasis of skin and nail: Secondary | ICD-10-CM | POA: Diagnosis not present

## 2021-10-22 DIAGNOSIS — M5126 Other intervertebral disc displacement, lumbar region: Secondary | ICD-10-CM | POA: Diagnosis not present

## 2021-10-22 DIAGNOSIS — M5136 Other intervertebral disc degeneration, lumbar region: Secondary | ICD-10-CM | POA: Diagnosis not present

## 2021-10-22 DIAGNOSIS — Z6832 Body mass index (BMI) 32.0-32.9, adult: Secondary | ICD-10-CM | POA: Diagnosis not present

## 2021-10-22 DIAGNOSIS — M5137 Other intervertebral disc degeneration, lumbosacral region: Secondary | ICD-10-CM | POA: Diagnosis not present

## 2021-10-22 DIAGNOSIS — M5416 Radiculopathy, lumbar region: Secondary | ICD-10-CM | POA: Diagnosis not present

## 2021-10-24 ENCOUNTER — Other Ambulatory Visit: Payer: Self-pay | Admitting: Cardiology

## 2021-10-24 DIAGNOSIS — I480 Paroxysmal atrial fibrillation: Secondary | ICD-10-CM

## 2021-10-24 NOTE — Telephone Encounter (Signed)
From patient.

## 2021-10-31 DIAGNOSIS — M5416 Radiculopathy, lumbar region: Secondary | ICD-10-CM | POA: Diagnosis not present

## 2021-11-01 DIAGNOSIS — I781 Nevus, non-neoplastic: Secondary | ICD-10-CM | POA: Diagnosis not present

## 2021-11-01 DIAGNOSIS — L304 Erythema intertrigo: Secondary | ICD-10-CM | POA: Diagnosis not present

## 2021-11-01 DIAGNOSIS — L570 Actinic keratosis: Secondary | ICD-10-CM | POA: Diagnosis not present

## 2021-11-19 DIAGNOSIS — K648 Other hemorrhoids: Secondary | ICD-10-CM | POA: Diagnosis not present

## 2021-11-20 ENCOUNTER — Ambulatory Visit: Payer: Medicare HMO | Admitting: Cardiology

## 2021-11-21 DIAGNOSIS — L820 Inflamed seborrheic keratosis: Secondary | ICD-10-CM | POA: Diagnosis not present

## 2021-11-21 DIAGNOSIS — D485 Neoplasm of uncertain behavior of skin: Secondary | ICD-10-CM | POA: Diagnosis not present

## 2021-11-22 DIAGNOSIS — N400 Enlarged prostate without lower urinary tract symptoms: Secondary | ICD-10-CM | POA: Diagnosis not present

## 2021-11-22 DIAGNOSIS — Z8551 Personal history of malignant neoplasm of bladder: Secondary | ICD-10-CM | POA: Diagnosis not present

## 2021-11-27 ENCOUNTER — Encounter: Payer: Self-pay | Admitting: Cardiology

## 2021-11-27 ENCOUNTER — Ambulatory Visit: Payer: Medicare HMO | Admitting: Cardiology

## 2021-11-27 VITALS — BP 130/80 | HR 52 | Resp 16 | Ht 71.0 in | Wt 228.0 lb

## 2021-11-27 DIAGNOSIS — I452 Bifascicular block: Secondary | ICD-10-CM

## 2021-11-27 DIAGNOSIS — I10 Essential (primary) hypertension: Secondary | ICD-10-CM

## 2021-11-27 DIAGNOSIS — Z5181 Encounter for therapeutic drug level monitoring: Secondary | ICD-10-CM

## 2021-11-27 DIAGNOSIS — I480 Paroxysmal atrial fibrillation: Secondary | ICD-10-CM | POA: Diagnosis not present

## 2021-11-27 NOTE — Progress Notes (Signed)
Primary Physician/Referring:  Kathalene Frames, MD  Patient ID: Christian [name], male    DOB: [birth year], 76 y.o.   MRN: [id number]  Chief Complaint  Patient presents with   Atrial Fibrillation   Follow-up    1 year   Hypertension   HPI:    Christian [name]  is a 76 y.o. Caucasian male  with a history of paroxysmal atrial fibrillation, presently on flecainide 100 mg p.o. b.i.d, history of bilateral venous insufficiency S/P ablation left leg in 2019, hypertension, hyperglycemia and asymptomatic S. Bradycardia.  He presents for an annual follow up visit. Since his last visit he has been feeling well from a cardiac standpoint. Denies chest pain, dyspnea, orthopnea, palpitations, syncope.  States that he discontinued taking anticoagulation since February 2023.  Past Medical History:  Diagnosis Date   Allergic rhinitis    Anticoagulant long-term use    xarelto--- managed by dr Einar Gip   Arthritis    Asthma    baldder cancer    Benign localized prostatic hyperplasia with lower urinary tract symptoms (LUTS)    last uti march 2022   Bladder tumor    BPH (benign prostatic hypertrophy)    COVID 05/2019   mild symptoms x 2 days all sx resolved   DDD (degenerative disc disease), cervical    DDD (degenerative disc disease), lumbar    Edema of right lower extremity    wears ted hose   Factor V deficiency ([place])    05-26-2019 per pt has never had a dvt or pe but has had severeal episodes of superficial venous thrombosis, none recently   Family history of atrial fibrillation    His brother, sister have A. fib, suspects his father also had A. fib   Family history of factor V deficiency    Frequency of urination    GERD (gastroesophageal reflux disease)    Hematuria    Hernia of abdominal wall 07/17/2020   in stomach asymptomatic per pt   History of chronic bronchitis    Hypertension    followed by pcp  and dr Einar Gip   Brantley Fling 09-09-2015 low risk with normal perfusion and ef 60%;  ETT  08-31-2017 normal with no evidence ischemia : both in epic)   Nocturia    PAF (paroxysmal atrial fibrillation) [facility])    cardiologist----  dr Einar Gip-- dx 2017   RBBB (right bundle branch block)    Varicose veins of right lower extremity with pain    Venous insufficiency of both lower extremities    vascular--- dr fields--  right > left ;  s/p  ablation and phlebectomies   Wears glasses    Past Surgical History:  Procedure Laterality Date   CATARACT EXTRACTION W/ INTRAOCULAR LENS  IMPLANT, BILATERAL Bilateral 2014   COLONOSCOPY N/A 06/27/2014   Procedure: COLONOSCOPY;  Surgeon: Garlan Fair, MD;  Location: [facility];  Service: Endoscopy;  Laterality: N/A;   CYSTOSCOPY WITH FULGERATION N/A 06/01/2019   Procedure: CYSTOSCOPY WITH BLADDER BIOPSY/ FULGERATION;  Surgeon: Ceasar Mons, MD;  Location: [facility];  Service: Urology;  Laterality: N/A;   ENDOVENOUS ABLATION SAPHENOUS VEIN W/ LASER Right 06/30/2016   EVLA R GSV by Tinnie Gens MD    ENDOVENOUS ABLATION SAPHENOUS VEIN W/ LASER Left 07/14/2016   endovenous laser ablation Left greater saphenous vein by Tinnie Gens MD    REPAIR CONGENITAL URETHRAL DEFECT  INFANT   SKIN BIOPSY  07/17/2020   right knee biopsy of severe atypsia x  3 bx surgery planned for 08-2020   stab phlebectomy Left 11/24/2016   stab phlebectomy > 20 incisions left leg by Tinnie Gens MD    stab phlebectomy  Right `12-29-2016   stab phlebectomy > 20 incisions right leg by Tinnie Gens MD    THULIUM LASER TURP (TRANSURETHRAL RESECTION OF PROSTATE) N/A 07/20/2020   Procedure: Marcelino Duster LASER TURP (TRANSURETHRAL RESECTION OF PROSTATE), CYSTOSCOPY;  Surgeon: Ceasar Mons, MD;  Location: [facility];  Service: Urology;  Laterality: N/A;  ONLY NEEDS 60 MIN   TRANSURETHRAL RESECTION OF PROSTATE  03/10/2011   Procedure: TRANSURETHRAL RESECTION OF THE PROSTATE WITH GYRUS INSTRUMENTS;  Surgeon: Bernestine Amass, MD;   Location: [facility];  Service: Urology;  Laterality: N/A;  [place] TURP OWER    Social History   Tobacco Use   Smoking status: Former    Packs/day: 1.00    Years: 20.00    Total pack years: 20.00    Types: Cigarettes    Quit date: 02/28/1995    Years since quitting: 26.7   Smokeless tobacco: Never  Substance Use Topics   Alcohol use: Yes    Alcohol/week: 14.0 standard drinks of alcohol    Types: 14 Shots of liquor per week    Comment: 2 or 3 drinks daily  Marital Status: Married  ROS  Review of Systems  Cardiovascular:  Positive for leg swelling (mild, bilateral ankles). Negative for chest pain, dyspnea on exertion, palpitations and syncope.  Musculoskeletal:  Positive for back pain (chronic) and joint pain (bilataral knee).   Objective      11/27/2021    9:32 AM 11/19/2020    2:27 PM 07/20/2020    3:09 PM  Vitals with BMI  Height '5\' 11"'$  '5\' 11"'$    Weight 228 lbs 226 lbs   BMI 40.98 11.91   Systolic 478 295 621  Diastolic 80 76 74  Pulse 52 58 51    Blood pressure 130/80, pulse (!) 52, resp. rate 16, height '5\' 11"'$  (1.803 m), weight 228 lb (103.4 kg), SpO2 97 %. Body mass index is 31.8 kg/m.   Physical Exam Constitutional:      Appearance: He is well-developed.     Comments: Mildly obese  Neck:     Vascular: No carotid bruit or JVD.  Cardiovascular:     Rate and Rhythm: Regular rhythm. Bradycardia present.     Pulses: Normal pulses and intact distal pulses.     Heart sounds: Normal heart sounds. No murmur heard.    No gallop.  Pulmonary:     Effort: Pulmonary effort is normal.     Breath sounds: Normal breath sounds.  Abdominal:     General: Bowel sounds are normal.     Palpations: Abdomen is soft.  Musculoskeletal:     Right lower leg: Edema (Bilateral lower extremity varicose veins noted, right worse. 1+ ankle edema) present.     Left lower leg: Edema (Bilateral lower extremity varicose veins noted, right worse. 1+ ankle edema) present.     Radiology: No results found.  Laboratory examination:   External Labs: Cholesterol, total 158.000 m 12/27/2020 HDL 69.000 mg 12/27/2020 LDL 71.000 mg 12/27/2020 Triglycerides 93.000 mg 12/27/2020  A1C 5.800 % 12/27/2020 TSH 1.970 12/27/2020  Hemoglobin 14.100 g/d 12/27/2020 Creatinine, Serum 1.090 mg/ 12/27/2020 Potassium 4.300 mm 12/27/2020 ALT (SGPT) 23.000 U/L 12/27/2020  Medications   Allergies  Allergen Reactions   Gin [Alcohol] Other (See Comments)    "Pins and needles feeling and  blotches on my neck"   Other     Bcg caused blood clot and flu like symptoms   Adhesive [Tape] Other (See Comments)    Redness/ irritation   Lobster [Shellfish Allergy] Hives    And per pt positive Allergy test     Current Outpatient Medications:    atorvastatin (LIPITOR) 10 MG tablet, TAKE 1 TABLET BY MOUTH EVERY DAY, Disp: 90 tablet, Rfl: 2   Cholecalciferol 125 MCG (5000 UT) capsule, Take 5,000 Units by mouth at bedtime. , Disp: , Rfl:    Coenzyme Q10 (COQ10) 100 MG CAPS, Take 1 capsule by mouth at bedtime., Disp: , Rfl:    diclofenac (VOLTAREN) 50 MG EC tablet, Take 50 mg by mouth 2 (two) times daily., Disp: , Rfl:    flecainide (TAMBOCOR) 100 MG tablet, TAKE ONE TABLET BY MOUTH AT BREAKFAST AND AT BEDTIME, Disp: 180 tablet, Rfl: 3   fluticasone (FLONASE) 50 MCG/ACT nasal spray, Place 1-2 sprays into both nostrils daily., Disp: , Rfl:    gabapentin (NEURONTIN) 300 MG capsule, Take 300 mg by mouth daily at 6 (six) AM., Disp: , Rfl:    levocetirizine (XYZAL) 5 MG tablet, Take 5 mg by mouth at bedtime., Disp: , Rfl:    losartan-hydrochlorothiazide (HYZAAR) 100-12.5 MG tablet, Take 1 tablet by mouth daily. Currently taking losartan 100 mg and hctz 12. '5mg'$ , Disp: , Rfl:    melatonin 5 MG TABS, Take 5 mg by mouth at bedtime., Disp: , Rfl:    metoprolol tartrate (LOPRESSOR) 25 MG tablet, Take 25 mg by mouth 2 (two) times daily., Disp: , Rfl:    montelukast (SINGULAIR) 10 MG tablet, Take  10 mg by mouth at bedtime., Disp: , Rfl: 3   Multiple Vitamins-Minerals (CENTRUM SILVER PO), Take 1 tablet by mouth at bedtime. , Disp: , Rfl:    omeprazole (PRILOSEC) 20 MG capsule, Take 20 mg by mouth every other day. At in am, Disp: , Rfl:    sildenafil (VIAGRA) 50 MG tablet, Take 20 mg by mouth at bedtime. Qhs also takes prn also, Disp: , Rfl:    SYMBICORT 160-4.5 MCG/ACT inhaler, Inhale 2 puffs into the lungs in the morning and at bedtime. , Disp: , Rfl:    tamsulosin (FLOMAX) 0.4 MG CAPS capsule, Take 0.4 mg by mouth in the morning and at bedtime. , Disp: , Rfl:    traMADol (ULTRAM) 50 MG tablet, Take 50 mg by mouth as needed., Disp: , Rfl:    zolpidem (AMBIEN) 5 MG tablet, Take 5 mg by mouth at bedtime as needed for sleep. , Disp: , Rfl:    acetaminophen (TYLENOL) 650 MG CR tablet, Take 1,300 mg by mouth in the morning and at bedtime. (Patient not taking: Reported on 11/27/2021), Disp: , Rfl:     Cardiac Studies:   Echocardiogram 09/05/2015: Left ventricle cavity is normal in size. Mild concentric hypertrophy of the left ventricle. Normal global wall motion. Normal diastolic filling pattern. Calculated EF 55%. Left atrial cavity is moderately dilated at 4.5 cm. Trace mitral regurgitation. Mild tricuspid regurgitation. No evidence of pulmonary hypertension.  Lexiscan myoview stress test 09/03/2015: 1. Resting EKG demonstrated normal sinus rhythm, leftward axis, right bundle branch block.  Low-voltage complexes.  Stress EKG was nondiagnostic for ischemia as this is a pharmacologic stress test.  Patient attempt treadmill stress testing, however unable to complete stress test due to marked exercise intolerance. 2. The perfusion imaging study demonstrates mild gut uptake artifact in the inferior wall without any  demonstrable ischemia or scar.  Left ventricular systolic function calculated by QGS was 60%. This is a low risk study.   Exercise Treadmill Stress Test 08/31/2017:  Indication:  SoB  The patient exercised on Bruce protocol for  10:05 min. Patient achieved  10.16 METS and reached HR  124 bpm, which is   82% of maximum age-predicted HR.  Stress test terminated due to Fatigue.   Exercise capacity was normal. HR Response to Exercise: Attenuated secondary to medication (metoprololol 25 mg bid, last dose 15 hours ago. ) BP Response to Exercise: Normal resting BP- appropriate response. Chest Pain: none. Arrhythmias: none. Resting EKG demonstrates RBBB with ST-T changes in precordial peads related to RBBB Exercise EKG: Sinus tachycardia with RBBB with ST-T changes in precordial peads related to RBBB. No ischemic cahnges seen.  Overall Impression: Submaximal stress test with no ishcmic sympyoms or EKG changes Continue primary/secondary prevention. Consider alterante etiology for shortness of breath, or  further cardiac workup if pre test probability for CAD is high.  EKG:   EKG 11/27/2021: Normal sinus rhythm at rate of 54 bpm, LAE, left axis deviation, left anterior fascicular block.  Right bundle branch block.  Bifascicular block.  Normal QT interval.  Compared to 11/19/2020, no significant change.   Assessment     ICD-10-CM   1. Paroxysmal atrial fibrillation (HCC)  I48.0 EKG 12-Lead    PCV ECHOCARDIOGRAM COMPLETE    PCV CARDIAC STRESS TEST    2. Essential hypertension  I10     3. Bifascicular block  I45.2     4. Therapeutic drug monitoring - Flecainide  Z51.81 PCV CARDIAC STRESS TEST      CHA2DS2-VASc Score is 2.  Yearly risk of stroke: 2.3% (A, HTN).  Score of 1=0.6; 2=2.2; 3=3.2; 4=4.8; 5=7.2; 6=9.8; 7=>9.8) -(CHF; HTN; vasc disease DM,  Male = 1; Age <65 =0; 65-74 = 1,  >75 =2; stroke/embolism= 2).   Medications Discontinued During This Encounter  Medication Reason   finasteride (PROSCAR) 5 MG tablet    XARELTO 20 MG TABS tablet     No orders of the defined types were placed in this encounter.  Recommendations:   Christian [name] is a 76 y.o. male  with a history of paroxysmal atrial fibrillation, presently on flecainide 100 mg p.o. b.i.d, history of bilateral venous insufficiency S/P ablation left leg in 2019, hypertension, hyperglycemia and asymptomatic S. Bradycardia.  1. Paroxysmal atrial fibrillation (HCC) Patient's last episode of atrial fibrillation was in 2017 with no recurrence, as his chads vascular score is only 2.0, he has discontinued anticoagulation almost 8 to 9 months ago in February 2023.  We will continue observation for now.  This was a shared decision making regarding discontinuation of anticoagulation.  He is presently on flecainide.  2. Essential hypertension Blood pressure is very well controlled on present medical regimen, initially was elevated when he came in.  Occasional episodes of hypertension related to excess salt intake.  3. Bifascicular block Patient has underlying bifascicular block and sinus bradycardia but completely asymptomatic, although his activity has been limited by neurogenic claudication especially the right leg and arthritis in his bilateral knee.  4. Therapeutic drug monitoring - Flecainide Patient has been on flecainide and it has been greater than 4 to 5 years ago he has had a treadmill stress test, in spite of back pain and arthritis, he would like to try to walk the treadmill.  Otherwise stable from cardiac standpoint, I will see him back in a year.  Adrian Prows, MD, [facility] 11/27/2021, 10:02 AM Office: [phone]

## 2021-12-04 DIAGNOSIS — E785 Hyperlipidemia, unspecified: Secondary | ICD-10-CM | POA: Diagnosis not present

## 2021-12-04 DIAGNOSIS — J42 Unspecified chronic bronchitis: Secondary | ICD-10-CM | POA: Diagnosis not present

## 2021-12-04 DIAGNOSIS — G4700 Insomnia, unspecified: Secondary | ICD-10-CM | POA: Diagnosis not present

## 2021-12-04 DIAGNOSIS — K219 Gastro-esophageal reflux disease without esophagitis: Secondary | ICD-10-CM | POA: Diagnosis not present

## 2021-12-04 DIAGNOSIS — I10 Essential (primary) hypertension: Secondary | ICD-10-CM | POA: Diagnosis not present

## 2021-12-04 DIAGNOSIS — J45909 Unspecified asthma, uncomplicated: Secondary | ICD-10-CM | POA: Diagnosis not present

## 2021-12-04 DIAGNOSIS — N401 Enlarged prostate with lower urinary tract symptoms: Secondary | ICD-10-CM | POA: Diagnosis not present

## 2021-12-13 ENCOUNTER — Ambulatory Visit: Payer: Medicare HMO

## 2021-12-13 DIAGNOSIS — I480 Paroxysmal atrial fibrillation: Secondary | ICD-10-CM

## 2021-12-13 DIAGNOSIS — Z5181 Encounter for therapeutic drug level monitoring: Secondary | ICD-10-CM | POA: Diagnosis not present

## 2021-12-16 DIAGNOSIS — D3131 Benign neoplasm of right choroid: Secondary | ICD-10-CM | POA: Diagnosis not present

## 2021-12-16 DIAGNOSIS — Z961 Presence of intraocular lens: Secondary | ICD-10-CM | POA: Diagnosis not present

## 2022-01-02 ENCOUNTER — Encounter: Payer: Self-pay | Admitting: Cardiology

## 2022-01-03 NOTE — Telephone Encounter (Signed)
done

## 2022-01-03 NOTE — Telephone Encounter (Signed)
From patient.

## 2022-01-16 DIAGNOSIS — Z6831 Body mass index (BMI) 31.0-31.9, adult: Secondary | ICD-10-CM | POA: Diagnosis not present

## 2022-01-16 DIAGNOSIS — M48061 Spinal stenosis, lumbar region without neurogenic claudication: Secondary | ICD-10-CM | POA: Diagnosis not present

## 2022-01-23 DIAGNOSIS — N401 Enlarged prostate with lower urinary tract symptoms: Secondary | ICD-10-CM | POA: Diagnosis not present

## 2022-01-23 DIAGNOSIS — K219 Gastro-esophageal reflux disease without esophagitis: Secondary | ICD-10-CM | POA: Diagnosis not present

## 2022-01-23 DIAGNOSIS — I10 Essential (primary) hypertension: Secondary | ICD-10-CM | POA: Diagnosis not present

## 2022-01-23 DIAGNOSIS — M47812 Spondylosis without myelopathy or radiculopathy, cervical region: Secondary | ICD-10-CM | POA: Diagnosis not present

## 2022-01-23 DIAGNOSIS — J45909 Unspecified asthma, uncomplicated: Secondary | ICD-10-CM | POA: Diagnosis not present

## 2022-01-23 DIAGNOSIS — E785 Hyperlipidemia, unspecified: Secondary | ICD-10-CM | POA: Diagnosis not present

## 2022-01-23 DIAGNOSIS — G4700 Insomnia, unspecified: Secondary | ICD-10-CM | POA: Diagnosis not present

## 2022-01-30 ENCOUNTER — Ambulatory Visit: Payer: Medicare HMO | Admitting: Cardiology

## 2022-02-03 DIAGNOSIS — M5416 Radiculopathy, lumbar region: Secondary | ICD-10-CM | POA: Diagnosis not present

## 2022-02-09 ENCOUNTER — Encounter: Payer: Self-pay | Admitting: Cardiology

## 2022-02-09 DIAGNOSIS — I10 Essential (primary) hypertension: Secondary | ICD-10-CM

## 2022-02-09 NOTE — Progress Notes (Unsigned)
Primary Physician/Referring:  Kathalene Frames, MD  Patient ID: Christian [name], male    DOB: October 05, 1945, 77 y.o.   MRN: [id number]  No chief complaint on file.  HPI:    Christian [name]  is a 77 y.o. Caucasian male  with a history of paroxysmal atrial fibrillation, presently on flecainide 100 mg p.o. b.i.d, history of bilateral venous insufficiency S/P ablation left leg in 2019, hypertension, hyperglycemia and asymptomatic S. Bradycardia.  He presents for an annual follow up visit. Since his last visit he has been feeling well from a cardiac standpoint. Denies chest pain, dyspnea, orthopnea, palpitations, syncope.  States that he discontinued taking anticoagulation since February 2023.  Past Medical History:  Diagnosis Date   Allergic rhinitis    Anticoagulant long-term use    xarelto--- managed by dr Einar Gip   Arthritis    Asthma    baldder cancer    Benign localized prostatic hyperplasia with lower urinary tract symptoms (LUTS)    last uti march 2022   Bladder tumor    BPH (benign prostatic hypertrophy)    COVID 05/2019   mild symptoms x 2 days all sx resolved   DDD (degenerative disc disease), cervical    DDD (degenerative disc disease), lumbar    Edema of right lower extremity    wears ted hose   Factor V deficiency ([place])    05-26-2019 per pt has never had a dvt or pe but has had severeal episodes of superficial venous thrombosis, none recently   Family history of atrial fibrillation    His brother, sister have A. fib, suspects his father also had A. fib   Family history of factor V deficiency    Frequency of urination    GERD (gastroesophageal reflux disease)    Hematuria    Hernia of abdominal wall 07/17/2020   in stomach asymptomatic per pt   History of chronic bronchitis    Hypertension    followed by pcp  and dr Einar Gip   Brantley Fling 09-09-2015 low risk with normal perfusion and ef 60%;  ETT 08-31-2017 normal with no evidence ischemia : both in epic)   Nocturia    PAF  (paroxysmal atrial fibrillation) [facility])    cardiologist----  dr Einar Gip-- dx 2017   Primary hypertension 02/09/2022   RBBB (right bundle branch block)    Varicose veins of right lower extremity with pain    Venous insufficiency of both lower extremities    vascular--- dr fields--  right > left ;  s/p  ablation and phlebectomies   Wears glasses    Past Surgical History:  Procedure Laterality Date   CATARACT EXTRACTION W/ INTRAOCULAR LENS  IMPLANT, BILATERAL Bilateral 2014   COLONOSCOPY N/A 06/27/2014   Procedure: COLONOSCOPY;  Surgeon: Garlan Fair, MD;  Location: [facility];  Service: Endoscopy;  Laterality: N/A;   CYSTOSCOPY WITH FULGERATION N/A 06/01/2019   Procedure: CYSTOSCOPY WITH BLADDER BIOPSY/ FULGERATION;  Surgeon: Ceasar Mons, MD;  Location: [facility];  Service: Urology;  Laterality: N/A;   ENDOVENOUS ABLATION SAPHENOUS VEIN W/ LASER Right 06/30/2016   EVLA R GSV by Tinnie Gens MD    ENDOVENOUS ABLATION SAPHENOUS VEIN W/ LASER Left 07/14/2016   endovenous laser ablation Left greater saphenous vein by Tinnie Gens MD    REPAIR CONGENITAL URETHRAL DEFECT  INFANT   SKIN BIOPSY  07/17/2020   right knee biopsy of severe atypsia x 3 bx surgery planned for 08-2020   stab phlebectomy Left 11/24/2016  stab phlebectomy > 20 incisions left leg by Tinnie Gens MD    stab phlebectomy  Right `12-29-2016   stab phlebectomy > 20 incisions right leg by Tinnie Gens MD    THULIUM LASER TURP (TRANSURETHRAL RESECTION OF PROSTATE) N/A 07/20/2020   Procedure: Marcelino Duster LASER TURP (TRANSURETHRAL RESECTION OF PROSTATE), CYSTOSCOPY;  Surgeon: Ceasar Mons, MD;  Location: [facility];  Service: Urology;  Laterality: N/A;  ONLY NEEDS 60 MIN   TRANSURETHRAL RESECTION OF PROSTATE  03/10/2011   Procedure: TRANSURETHRAL RESECTION OF THE PROSTATE WITH GYRUS INSTRUMENTS;  Surgeon: Bernestine Amass, MD;  Location: [facility];  Service:  Urology;  Laterality: N/A;  [place] TURP OWER    Social History   Tobacco Use   Smoking status: Former    Packs/day: 1.00    Years: 20.00    Total pack years: 20.00    Types: Cigarettes    Quit date: 02/28/1995    Years since quitting: 26.9   Smokeless tobacco: Never  Substance Use Topics   Alcohol use: Yes    Alcohol/week: 14.0 standard drinks of alcohol    Types: 14 Shots of liquor per week    Comment: 2 or 3 drinks daily  Marital Status: Married  ROS  Review of Systems  Cardiovascular:  Positive for leg swelling (mild, bilateral ankles). Negative for chest pain, dyspnea on exertion, palpitations and syncope.  Musculoskeletal:  Positive for back pain (chronic) and joint pain (bilataral knee).   Objective      11/27/2021    9:32 AM 11/19/2020    2:27 PM 07/20/2020    3:09 PM  Vitals with BMI  Height '5\' 11"'$  '5\' 11"'$    Weight 228 lbs 226 lbs   BMI 18.29 93.71   Systolic [phone]  Diastolic 80 76 74  Pulse 52 58 51    There were no vitals taken for this visit. There is no height or weight on file to calculate BMI.   Physical Exam Constitutional:      Appearance: He is well-developed.     Comments: Mildly obese  Neck:     Vascular: No carotid bruit or JVD.  Cardiovascular:     Rate and Rhythm: Regular rhythm. Bradycardia present.     Pulses: Normal pulses and intact distal pulses.     Heart sounds: Normal heart sounds. No murmur heard.    No gallop.  Pulmonary:     Effort: Pulmonary effort is normal.     Breath sounds: Normal breath sounds.  Abdominal:     General: Bowel sounds are normal.     Palpations: Abdomen is soft.  Musculoskeletal:     Right lower leg: Edema (Bilateral lower extremity varicose veins noted, right worse. 1+ ankle edema) present.     Left lower leg: Edema (Bilateral lower extremity varicose veins noted, right worse. 1+ ankle edema) present.    Radiology: No results found.  Laboratory examination:   External  Labs: Cholesterol, total 158.000 m 12/27/2020 HDL 69.000 mg 12/27/2020 LDL 71.000 mg 12/27/2020 Triglycerides 93.000 mg 12/27/2020  A1C 5.800 % 12/27/2020 TSH 1.970 12/27/2020  Hemoglobin 14.100 g/d 12/27/2020 Creatinine, Serum 1.090 mg/ 12/27/2020 Potassium 4.300 mm 12/27/2020 ALT (SGPT) 23.000 U/L 12/27/2020  Medications   Allergies  Allergen Reactions   Gin [Alcohol] Other (See Comments)    "Pins and needles feeling and blotches on my neck"   Other     Bcg caused blood clot and flu like symptoms   Adhesive [Tape]  Other (See Comments)    Redness/ irritation   Lobster [Shellfish Allergy] Hives    And per pt positive Allergy test     Current Outpatient Medications:    acetaminophen (TYLENOL) 650 MG CR tablet, Take 1,300 mg by mouth in the morning and at bedtime. (Patient not taking: Reported on 11/27/2021), Disp: , Rfl:    atorvastatin (LIPITOR) 10 MG tablet, TAKE 1 TABLET BY MOUTH EVERY DAY, Disp: 90 tablet, Rfl: 2   Cholecalciferol 125 MCG (5000 UT) capsule, Take 5,000 Units by mouth at bedtime. , Disp: , Rfl:    Coenzyme Q10 (COQ10) 100 MG CAPS, Take 1 capsule by mouth at bedtime., Disp: , Rfl:    diclofenac (VOLTAREN) 50 MG EC tablet, Take 50 mg by mouth 2 (two) times daily., Disp: , Rfl:    flecainide (TAMBOCOR) 100 MG tablet, TAKE ONE TABLET BY MOUTH AT BREAKFAST AND AT BEDTIME, Disp: 180 tablet, Rfl: 3   fluticasone (FLONASE) 50 MCG/ACT nasal spray, Place 1-2 sprays into both nostrils daily., Disp: , Rfl:    gabapentin (NEURONTIN) 300 MG capsule, Take 300 mg by mouth daily at 6 (six) AM., Disp: , Rfl:    levocetirizine (XYZAL) 5 MG tablet, Take 5 mg by mouth at bedtime., Disp: , Rfl:    losartan-hydrochlorothiazide (HYZAAR) 100-12.5 MG tablet, Take 1 tablet by mouth daily. Currently taking losartan 100 mg and hctz 12. '5mg'$ , Disp: , Rfl:    melatonin 5 MG TABS, Take 5 mg by mouth at bedtime., Disp: , Rfl:    metoprolol tartrate (LOPRESSOR) 25 MG tablet, Take 25 mg by  mouth 2 (two) times daily., Disp: , Rfl:    montelukast (SINGULAIR) 10 MG tablet, Take 10 mg by mouth at bedtime., Disp: , Rfl: 3   Multiple Vitamins-Minerals (CENTRUM SILVER PO), Take 1 tablet by mouth at bedtime. , Disp: , Rfl:    omeprazole (PRILOSEC) 20 MG capsule, Take 20 mg by mouth every other day. At in am, Disp: , Rfl:    sildenafil (VIAGRA) 50 MG tablet, Take 20 mg by mouth at bedtime. Qhs also takes prn also, Disp: , Rfl:    SYMBICORT 160-4.5 MCG/ACT inhaler, Inhale 2 puffs into the lungs in the morning and at bedtime. , Disp: , Rfl:    tamsulosin (FLOMAX) 0.4 MG CAPS capsule, Take 0.4 mg by mouth in the morning and at bedtime. , Disp: , Rfl:    traMADol (ULTRAM) 50 MG tablet, Take 50 mg by mouth as needed., Disp: , Rfl:    zolpidem (AMBIEN) 5 MG tablet, Take 5 mg by mouth at bedtime as needed for sleep. , Disp: , Rfl:     Cardiac Studies:   Lexiscan myoview stress test 09/03/2015: 1. Resting EKG demonstrated normal sinus rhythm, leftward axis, right bundle branch block.  Low-voltage complexes.  Stress EKG was nondiagnostic for ischemia as this is a pharmacologic stress test.  Patient attempt treadmill stress testing, however unable to complete stress test due to marked exercise intolerance. 2. The perfusion imaging study demonstrates mild gut uptake artifact in the inferior wall without any demonstrable ischemia or scar.  Left ventricular systolic function calculated by QGS was 60%. This is a low risk study.   [facility] CARDIAC STRESS TEST 12/13/2021  Narrative Exercise treadmill stress test 12/16/2021: Exercise treadmill stress test performed using Bruce protocol.  Patient reached 8.3 METS, and 79% of age predicted maximum heart rate.  Exercise capacity was good.  No chest pain reported.  Normal heart rate and hemodynamic  response. No atrial fibrillation seen. Stress EKG showed sinus tachycardia, RBBB, LAFB, 1-2 mm upsloping ST depressions in leads I, II, aVL, V5, V6 with partial  normalization by 2 min into recovery. These changes are equivocal for ischemia. Intermediate risk study. Previous ETT in 2019 reported only RBB related precordial EKG changes at 10.1 METS and achieved 82% MPHR.Marland Kitchen   [facility] ECHOCARDIOGRAM COMPLETE 12/13/2021  Narrative Echocardiogram 12/13/2021: Study Quality: Good but no subcostal images. Normal LV systolic function with visual EF 60-65%. Left ventricle cavity is normal in size. Normal left ventricular wall thickness. Normal global wall motion. Normal diastolic filling pattern, normal LAP. Mild tricuspid regurgitation. No evidence of pulmonary hypertension. RVSP measures 33 mmHg. Mild pulmonic regurgitation. Compared to 09/05/2015 otherwise no significant change.     EKG:   EKG 11/27/2021: Normal sinus rhythm at rate of 54 bpm, LAE, left axis deviation, left anterior fascicular block.  Right bundle branch block.  Bifascicular block.  Normal QT interval.  Compared to 11/19/2020, no significant change.   Assessment     ICD-10-CM   1. Abnormal stress ECG with treadmill  R94.39     2. Paroxysmal atrial fibrillation (HCC)  I48.0     3. Bifascicular block  I45.2     4. Primary hypertension  I10       CHA2DS2-VASc Score is 2.  Yearly risk of stroke: 2.3% (A, HTN).  Score of 1=0.6; 2=2.2; 3=3.2; 4=4.8; 5=7.2; 6=9.8; 7=>9.8) -(CHF; HTN; vasc disease DM,  Male = 1; Age <65 =0; 65-74 = 1,  >75 =2; stroke/embolism= 2).   There are no discontinued medications.   No orders of the defined types were placed in this encounter.  Recommendations:   Christian [name] is a 77 y.o. male with a history of paroxysmal atrial fibrillation, presently on flecainide 100 mg p.o. b.i.d, history of bilateral venous insufficiency S/P ablation left leg in 2019, hypertension, hyperglycemia and asymptomatic S. Bradycardia.  1. Abnormal stress ECG with treadmill ***  2. Paroxysmal atrial fibrillation (HCC) ***  3. Bifascicular block ***  4. Primary  hypertension ***    1. Paroxysmal atrial fibrillation ([place]) Patient's last episode of atrial fibrillation was in 2017 with no recurrence, as his chads vascular score is only 2.0, he has discontinued anticoagulation almost 8 to 9 months ago in February 2023.  We will continue observation for now.  This was a shared decision making regarding discontinuation of anticoagulation.  He is presently on flecainide.  2. Essential hypertension Blood pressure is very well controlled on present medical regimen, initially was elevated when he came in.  Occasional episodes of hypertension related to excess salt intake.  3. Bifascicular block Patient has underlying bifascicular block and sinus bradycardia but completely asymptomatic, although his activity has been limited by neurogenic claudication especially the right leg and arthritis in his bilateral knee.  4. Therapeutic drug monitoring - Flecainide Patient has been on flecainide and it has been greater than 4 to 5 years ago he has had a treadmill stress test, in spite of back pain and arthritis, he would like to try to walk the treadmill.  Otherwise stable from cardiac standpoint, I will see him back in a year.   Adrian Prows, MD, [facility] 02/09/2022, 10:41 PM Office: [phone]

## 2022-02-10 ENCOUNTER — Ambulatory Visit: Payer: Medicare HMO | Admitting: Cardiology

## 2022-02-10 ENCOUNTER — Encounter: Payer: Self-pay | Admitting: Cardiology

## 2022-02-10 VITALS — BP 118/69 | HR 56 | Resp 16 | Ht 71.0 in | Wt 227.8 lb

## 2022-02-10 DIAGNOSIS — I452 Bifascicular block: Secondary | ICD-10-CM

## 2022-02-10 DIAGNOSIS — I10 Essential (primary) hypertension: Secondary | ICD-10-CM | POA: Diagnosis not present

## 2022-02-10 DIAGNOSIS — I480 Paroxysmal atrial fibrillation: Secondary | ICD-10-CM

## 2022-02-10 DIAGNOSIS — R9439 Abnormal result of other cardiovascular function study: Secondary | ICD-10-CM

## 2022-02-10 MED ORDER — FLECAINIDE ACETATE 50 MG PO TABS: 50.0000 mg | ORAL_TABLET | Freq: Two times a day (BID) | ORAL | 3 refills | Status: DC

## 2022-02-28 DIAGNOSIS — I4891 Unspecified atrial fibrillation: Secondary | ICD-10-CM | POA: Diagnosis not present

## 2022-02-28 DIAGNOSIS — E6609 Other obesity due to excess calories: Secondary | ICD-10-CM | POA: Diagnosis not present

## 2022-02-28 DIAGNOSIS — R7303 Prediabetes: Secondary | ICD-10-CM | POA: Diagnosis not present

## 2022-02-28 DIAGNOSIS — D6869 Other thrombophilia: Secondary | ICD-10-CM | POA: Diagnosis not present

## 2022-02-28 DIAGNOSIS — E559 Vitamin D deficiency, unspecified: Secondary | ICD-10-CM | POA: Diagnosis not present

## 2022-02-28 DIAGNOSIS — J42 Unspecified chronic bronchitis: Secondary | ICD-10-CM | POA: Diagnosis not present

## 2022-02-28 DIAGNOSIS — Z79899 Other long term (current) drug therapy: Secondary | ICD-10-CM | POA: Diagnosis not present

## 2022-02-28 DIAGNOSIS — Z6831 Body mass index (BMI) 31.0-31.9, adult: Secondary | ICD-10-CM | POA: Diagnosis not present

## 2022-02-28 DIAGNOSIS — E7800 Pure hypercholesterolemia, unspecified: Secondary | ICD-10-CM | POA: Diagnosis not present

## 2022-02-28 DIAGNOSIS — J45909 Unspecified asthma, uncomplicated: Secondary | ICD-10-CM | POA: Diagnosis not present

## 2022-02-28 DIAGNOSIS — I10 Essential (primary) hypertension: Secondary | ICD-10-CM | POA: Diagnosis not present

## 2022-02-28 DIAGNOSIS — N401 Enlarged prostate with lower urinary tract symptoms: Secondary | ICD-10-CM | POA: Diagnosis not present

## 2022-02-28 DIAGNOSIS — Z0000 Encounter for general adult medical examination without abnormal findings: Secondary | ICD-10-CM | POA: Diagnosis not present

## 2022-03-03 ENCOUNTER — Encounter: Payer: Self-pay | Admitting: Cardiology

## 2022-03-03 NOTE — Telephone Encounter (Signed)
From pt

## 2022-03-12 ENCOUNTER — Encounter: Payer: Self-pay | Admitting: Cardiology

## 2022-03-13 ENCOUNTER — Other Ambulatory Visit: Payer: Self-pay

## 2022-03-13 DIAGNOSIS — N401 Enlarged prostate with lower urinary tract symptoms: Secondary | ICD-10-CM | POA: Diagnosis not present

## 2022-03-13 DIAGNOSIS — I4891 Unspecified atrial fibrillation: Secondary | ICD-10-CM | POA: Diagnosis not present

## 2022-03-13 DIAGNOSIS — R944 Abnormal results of kidney function studies: Secondary | ICD-10-CM | POA: Diagnosis not present

## 2022-03-13 DIAGNOSIS — R7989 Other specified abnormal findings of blood chemistry: Secondary | ICD-10-CM | POA: Diagnosis not present

## 2022-03-13 DIAGNOSIS — K219 Gastro-esophageal reflux disease without esophagitis: Secondary | ICD-10-CM | POA: Diagnosis not present

## 2022-03-13 DIAGNOSIS — J45909 Unspecified asthma, uncomplicated: Secondary | ICD-10-CM | POA: Diagnosis not present

## 2022-03-13 DIAGNOSIS — I10 Essential (primary) hypertension: Secondary | ICD-10-CM | POA: Diagnosis not present

## 2022-03-13 DIAGNOSIS — I480 Paroxysmal atrial fibrillation: Secondary | ICD-10-CM

## 2022-03-13 DIAGNOSIS — E7800 Pure hypercholesterolemia, unspecified: Secondary | ICD-10-CM | POA: Diagnosis not present

## 2022-03-13 DIAGNOSIS — G4700 Insomnia, unspecified: Secondary | ICD-10-CM | POA: Diagnosis not present

## 2022-03-13 MED ORDER — FLECAINIDE ACETATE 100 MG PO TABS: 50.0000 mg | ORAL_TABLET | Freq: Two times a day (BID) | ORAL | 3 refills | Status: DC

## 2022-03-13 NOTE — Telephone Encounter (Signed)
From patient

## 2022-03-17 ENCOUNTER — Other Ambulatory Visit: Payer: Self-pay

## 2022-03-17 DIAGNOSIS — I480 Paroxysmal atrial fibrillation: Secondary | ICD-10-CM

## 2022-03-17 MED ORDER — FLECAINIDE ACETATE 100 MG PO TABS: 100.0000 mg | ORAL_TABLET | Freq: Two times a day (BID) | ORAL | 3 refills | Status: DC

## 2022-03-21 DIAGNOSIS — K219 Gastro-esophageal reflux disease without esophagitis: Secondary | ICD-10-CM | POA: Diagnosis not present

## 2022-03-21 DIAGNOSIS — I10 Essential (primary) hypertension: Secondary | ICD-10-CM | POA: Diagnosis not present

## 2022-03-21 DIAGNOSIS — J45909 Unspecified asthma, uncomplicated: Secondary | ICD-10-CM | POA: Diagnosis not present

## 2022-03-21 DIAGNOSIS — E785 Hyperlipidemia, unspecified: Secondary | ICD-10-CM | POA: Diagnosis not present

## 2022-03-21 DIAGNOSIS — N401 Enlarged prostate with lower urinary tract symptoms: Secondary | ICD-10-CM | POA: Diagnosis not present

## 2022-03-21 DIAGNOSIS — G4700 Insomnia, unspecified: Secondary | ICD-10-CM | POA: Diagnosis not present

## 2022-03-21 DIAGNOSIS — I4891 Unspecified atrial fibrillation: Secondary | ICD-10-CM | POA: Diagnosis not present

## 2022-03-24 DIAGNOSIS — E871 Hypo-osmolality and hyponatremia: Secondary | ICD-10-CM | POA: Diagnosis not present

## 2022-03-24 DIAGNOSIS — R350 Frequency of micturition: Secondary | ICD-10-CM | POA: Diagnosis not present

## 2022-03-26 DIAGNOSIS — R350 Frequency of micturition: Secondary | ICD-10-CM | POA: Diagnosis not present

## 2022-03-26 DIAGNOSIS — E871 Hypo-osmolality and hyponatremia: Secondary | ICD-10-CM | POA: Diagnosis not present

## 2022-03-26 DIAGNOSIS — I4891 Unspecified atrial fibrillation: Secondary | ICD-10-CM | POA: Diagnosis not present

## 2022-03-26 DIAGNOSIS — D649 Anemia, unspecified: Secondary | ICD-10-CM | POA: Diagnosis not present

## 2022-03-26 DIAGNOSIS — I10 Essential (primary) hypertension: Secondary | ICD-10-CM | POA: Diagnosis not present

## 2022-03-26 DIAGNOSIS — N401 Enlarged prostate with lower urinary tract symptoms: Secondary | ICD-10-CM | POA: Diagnosis not present

## 2022-03-26 DIAGNOSIS — G8929 Other chronic pain: Secondary | ICD-10-CM | POA: Diagnosis not present

## 2022-03-26 DIAGNOSIS — M545 Low back pain, unspecified: Secondary | ICD-10-CM | POA: Diagnosis not present

## 2022-03-26 DIAGNOSIS — Z8551 Personal history of malignant neoplasm of bladder: Secondary | ICD-10-CM | POA: Diagnosis not present

## 2022-04-09 DIAGNOSIS — R350 Frequency of micturition: Secondary | ICD-10-CM | POA: Diagnosis not present

## 2022-04-22 DIAGNOSIS — E785 Hyperlipidemia, unspecified: Secondary | ICD-10-CM | POA: Diagnosis not present

## 2022-04-22 DIAGNOSIS — E669 Obesity, unspecified: Secondary | ICD-10-CM | POA: Diagnosis not present

## 2022-04-22 DIAGNOSIS — N400 Enlarged prostate without lower urinary tract symptoms: Secondary | ICD-10-CM | POA: Diagnosis not present

## 2022-04-22 DIAGNOSIS — G4700 Insomnia, unspecified: Secondary | ICD-10-CM | POA: Diagnosis not present

## 2022-04-22 DIAGNOSIS — J302 Other seasonal allergic rhinitis: Secondary | ICD-10-CM | POA: Diagnosis not present

## 2022-04-22 DIAGNOSIS — R32 Unspecified urinary incontinence: Secondary | ICD-10-CM | POA: Diagnosis not present

## 2022-04-22 DIAGNOSIS — J4489 Other specified chronic obstructive pulmonary disease: Secondary | ICD-10-CM | POA: Diagnosis not present

## 2022-04-22 DIAGNOSIS — K5900 Constipation, unspecified: Secondary | ICD-10-CM | POA: Diagnosis not present

## 2022-04-22 DIAGNOSIS — I10 Essential (primary) hypertension: Secondary | ICD-10-CM | POA: Diagnosis not present

## 2022-04-22 DIAGNOSIS — I25119 Atherosclerotic heart disease of native coronary artery with unspecified angina pectoris: Secondary | ICD-10-CM | POA: Diagnosis not present

## 2022-04-22 DIAGNOSIS — I471 Supraventricular tachycardia, unspecified: Secondary | ICD-10-CM | POA: Diagnosis not present

## 2022-04-22 DIAGNOSIS — K219 Gastro-esophageal reflux disease without esophagitis: Secondary | ICD-10-CM | POA: Diagnosis not present

## 2022-05-02 DIAGNOSIS — B029 Zoster without complications: Secondary | ICD-10-CM | POA: Diagnosis not present

## 2022-05-03 DIAGNOSIS — R69 Illness, unspecified: Secondary | ICD-10-CM | POA: Diagnosis not present

## 2022-05-05 DIAGNOSIS — M25561 Pain in right knee: Secondary | ICD-10-CM | POA: Diagnosis not present

## 2022-05-05 DIAGNOSIS — M25562 Pain in left knee: Secondary | ICD-10-CM | POA: Diagnosis not present

## 2022-05-06 DIAGNOSIS — L821 Other seborrheic keratosis: Secondary | ICD-10-CM | POA: Diagnosis not present

## 2022-05-06 DIAGNOSIS — M5416 Radiculopathy, lumbar region: Secondary | ICD-10-CM | POA: Diagnosis not present

## 2022-05-06 DIAGNOSIS — L816 Other disorders of diminished melanin formation: Secondary | ICD-10-CM | POA: Diagnosis not present

## 2022-05-06 DIAGNOSIS — L570 Actinic keratosis: Secondary | ICD-10-CM | POA: Diagnosis not present

## 2022-05-08 DIAGNOSIS — I10 Essential (primary) hypertension: Secondary | ICD-10-CM | POA: Diagnosis not present

## 2022-05-08 DIAGNOSIS — Z6831 Body mass index (BMI) 31.0-31.9, adult: Secondary | ICD-10-CM | POA: Diagnosis not present

## 2022-05-08 DIAGNOSIS — Z79899 Other long term (current) drug therapy: Secondary | ICD-10-CM | POA: Diagnosis not present

## 2022-05-08 DIAGNOSIS — M5416 Radiculopathy, lumbar region: Secondary | ICD-10-CM | POA: Diagnosis not present

## 2022-05-12 DIAGNOSIS — I4891 Unspecified atrial fibrillation: Secondary | ICD-10-CM | POA: Diagnosis not present

## 2022-05-12 DIAGNOSIS — I10 Essential (primary) hypertension: Secondary | ICD-10-CM | POA: Diagnosis not present

## 2022-05-12 DIAGNOSIS — M545 Low back pain, unspecified: Secondary | ICD-10-CM | POA: Diagnosis not present

## 2022-05-12 DIAGNOSIS — E871 Hypo-osmolality and hyponatremia: Secondary | ICD-10-CM | POA: Diagnosis not present

## 2022-05-13 DIAGNOSIS — N390 Urinary tract infection, site not specified: Secondary | ICD-10-CM | POA: Diagnosis not present

## 2022-05-13 DIAGNOSIS — Z8551 Personal history of malignant neoplasm of bladder: Secondary | ICD-10-CM | POA: Diagnosis not present

## 2022-05-16 DIAGNOSIS — Z8551 Personal history of malignant neoplasm of bladder: Secondary | ICD-10-CM | POA: Diagnosis not present

## 2022-05-24 DIAGNOSIS — R69 Illness, unspecified: Secondary | ICD-10-CM | POA: Diagnosis not present

## 2022-05-27 DIAGNOSIS — N401 Enlarged prostate with lower urinary tract symptoms: Secondary | ICD-10-CM | POA: Diagnosis not present

## 2022-06-15 DIAGNOSIS — R69 Illness, unspecified: Secondary | ICD-10-CM | POA: Diagnosis not present

## 2022-06-17 DIAGNOSIS — J709 Respiratory conditions due to unspecified external agent: Secondary | ICD-10-CM | POA: Diagnosis not present

## 2022-06-17 DIAGNOSIS — R059 Cough, unspecified: Secondary | ICD-10-CM | POA: Diagnosis not present

## 2022-06-17 DIAGNOSIS — H1045 Other chronic allergic conjunctivitis: Secondary | ICD-10-CM | POA: Diagnosis not present

## 2022-06-17 DIAGNOSIS — J3089 Other allergic rhinitis: Secondary | ICD-10-CM | POA: Diagnosis not present

## 2022-06-17 DIAGNOSIS — J301 Allergic rhinitis due to pollen: Secondary | ICD-10-CM | POA: Diagnosis not present

## 2022-06-18 DIAGNOSIS — M5451 Vertebrogenic low back pain: Secondary | ICD-10-CM | POA: Diagnosis not present

## 2022-06-18 DIAGNOSIS — M5441 Lumbago with sciatica, right side: Secondary | ICD-10-CM | POA: Diagnosis not present

## 2022-06-18 DIAGNOSIS — M9903 Segmental and somatic dysfunction of lumbar region: Secondary | ICD-10-CM | POA: Diagnosis not present

## 2022-06-18 DIAGNOSIS — M546 Pain in thoracic spine: Secondary | ICD-10-CM | POA: Diagnosis not present

## 2022-06-19 DIAGNOSIS — L089 Local infection of the skin and subcutaneous tissue, unspecified: Secondary | ICD-10-CM | POA: Diagnosis not present

## 2022-06-19 DIAGNOSIS — L570 Actinic keratosis: Secondary | ICD-10-CM | POA: Diagnosis not present

## 2022-06-19 DIAGNOSIS — L821 Other seborrheic keratosis: Secondary | ICD-10-CM | POA: Diagnosis not present

## 2022-06-20 DIAGNOSIS — M25562 Pain in left knee: Secondary | ICD-10-CM | POA: Diagnosis not present

## 2022-06-22 DIAGNOSIS — R69 Illness, unspecified: Secondary | ICD-10-CM | POA: Diagnosis not present

## 2022-07-14 DIAGNOSIS — M545 Low back pain, unspecified: Secondary | ICD-10-CM | POA: Diagnosis not present

## 2022-07-14 DIAGNOSIS — M25562 Pain in left knee: Secondary | ICD-10-CM | POA: Diagnosis not present

## 2022-07-15 DIAGNOSIS — M9901 Segmental and somatic dysfunction of cervical region: Secondary | ICD-10-CM | POA: Diagnosis not present

## 2022-07-15 DIAGNOSIS — M5137 Other intervertebral disc degeneration, lumbosacral region: Secondary | ICD-10-CM | POA: Diagnosis not present

## 2022-07-15 DIAGNOSIS — M50322 Other cervical disc degeneration at C5-C6 level: Secondary | ICD-10-CM | POA: Diagnosis not present

## 2022-07-15 DIAGNOSIS — M9903 Segmental and somatic dysfunction of lumbar region: Secondary | ICD-10-CM | POA: Diagnosis not present

## 2022-07-16 DIAGNOSIS — M9901 Segmental and somatic dysfunction of cervical region: Secondary | ICD-10-CM | POA: Diagnosis not present

## 2022-07-16 DIAGNOSIS — M5137 Other intervertebral disc degeneration, lumbosacral region: Secondary | ICD-10-CM | POA: Diagnosis not present

## 2022-07-16 DIAGNOSIS — M9903 Segmental and somatic dysfunction of lumbar region: Secondary | ICD-10-CM | POA: Diagnosis not present

## 2022-07-16 DIAGNOSIS — M50322 Other cervical disc degeneration at C5-C6 level: Secondary | ICD-10-CM | POA: Diagnosis not present

## 2022-07-17 DIAGNOSIS — M545 Low back pain, unspecified: Secondary | ICD-10-CM | POA: Diagnosis not present

## 2022-07-17 DIAGNOSIS — M25562 Pain in left knee: Secondary | ICD-10-CM | POA: Diagnosis not present

## 2022-07-21 DIAGNOSIS — M5137 Other intervertebral disc degeneration, lumbosacral region: Secondary | ICD-10-CM | POA: Diagnosis not present

## 2022-07-21 DIAGNOSIS — M9901 Segmental and somatic dysfunction of cervical region: Secondary | ICD-10-CM | POA: Diagnosis not present

## 2022-07-21 DIAGNOSIS — M9903 Segmental and somatic dysfunction of lumbar region: Secondary | ICD-10-CM | POA: Diagnosis not present

## 2022-07-21 DIAGNOSIS — M50322 Other cervical disc degeneration at C5-C6 level: Secondary | ICD-10-CM | POA: Diagnosis not present

## 2022-07-22 DIAGNOSIS — M50322 Other cervical disc degeneration at C5-C6 level: Secondary | ICD-10-CM | POA: Diagnosis not present

## 2022-07-22 DIAGNOSIS — M545 Low back pain, unspecified: Secondary | ICD-10-CM | POA: Diagnosis not present

## 2022-07-22 DIAGNOSIS — M5137 Other intervertebral disc degeneration, lumbosacral region: Secondary | ICD-10-CM | POA: Diagnosis not present

## 2022-07-22 DIAGNOSIS — M9901 Segmental and somatic dysfunction of cervical region: Secondary | ICD-10-CM | POA: Diagnosis not present

## 2022-07-22 DIAGNOSIS — M25562 Pain in left knee: Secondary | ICD-10-CM | POA: Diagnosis not present

## 2022-07-22 DIAGNOSIS — M9903 Segmental and somatic dysfunction of lumbar region: Secondary | ICD-10-CM | POA: Diagnosis not present

## 2022-07-23 DIAGNOSIS — M1712 Unilateral primary osteoarthritis, left knee: Secondary | ICD-10-CM | POA: Diagnosis not present

## 2022-07-24 ENCOUNTER — Other Ambulatory Visit: Payer: Self-pay | Admitting: Cardiology

## 2022-07-24 DIAGNOSIS — I480 Paroxysmal atrial fibrillation: Secondary | ICD-10-CM

## 2022-07-24 DIAGNOSIS — M9903 Segmental and somatic dysfunction of lumbar region: Secondary | ICD-10-CM | POA: Diagnosis not present

## 2022-07-24 DIAGNOSIS — M50322 Other cervical disc degeneration at C5-C6 level: Secondary | ICD-10-CM | POA: Diagnosis not present

## 2022-07-24 DIAGNOSIS — M5137 Other intervertebral disc degeneration, lumbosacral region: Secondary | ICD-10-CM | POA: Diagnosis not present

## 2022-07-24 DIAGNOSIS — D485 Neoplasm of uncertain behavior of skin: Secondary | ICD-10-CM | POA: Diagnosis not present

## 2022-07-24 DIAGNOSIS — M9901 Segmental and somatic dysfunction of cervical region: Secondary | ICD-10-CM | POA: Diagnosis not present

## 2022-07-25 NOTE — Telephone Encounter (Signed)
Refill

## 2022-07-25 NOTE — Telephone Encounter (Signed)
ICD-10-CM   1. Paroxysmal atrial fibrillation (HCC)  I48.0 flecainide (TAMBOCOR) 100 MG tablet     No orders of the defined types were placed in this encounter.   Meds ordered this encounter  Medications   flecainide (TAMBOCOR) 100 MG tablet    Sig: TAKE ONE TABLET BY MOUTH AT BREAKFAST AND AT BEDTIME    Dispense:  180 tablet    Refill:  3

## 2022-07-28 DIAGNOSIS — M5137 Other intervertebral disc degeneration, lumbosacral region: Secondary | ICD-10-CM | POA: Diagnosis not present

## 2022-07-28 DIAGNOSIS — M9901 Segmental and somatic dysfunction of cervical region: Secondary | ICD-10-CM | POA: Diagnosis not present

## 2022-07-28 DIAGNOSIS — M9903 Segmental and somatic dysfunction of lumbar region: Secondary | ICD-10-CM | POA: Diagnosis not present

## 2022-07-28 DIAGNOSIS — M50322 Other cervical disc degeneration at C5-C6 level: Secondary | ICD-10-CM | POA: Diagnosis not present

## 2022-07-29 DIAGNOSIS — M9903 Segmental and somatic dysfunction of lumbar region: Secondary | ICD-10-CM | POA: Diagnosis not present

## 2022-07-29 DIAGNOSIS — M9901 Segmental and somatic dysfunction of cervical region: Secondary | ICD-10-CM | POA: Diagnosis not present

## 2022-07-29 DIAGNOSIS — M5137 Other intervertebral disc degeneration, lumbosacral region: Secondary | ICD-10-CM | POA: Diagnosis not present

## 2022-07-29 DIAGNOSIS — M50322 Other cervical disc degeneration at C5-C6 level: Secondary | ICD-10-CM | POA: Diagnosis not present

## 2022-07-31 DIAGNOSIS — M5137 Other intervertebral disc degeneration, lumbosacral region: Secondary | ICD-10-CM | POA: Diagnosis not present

## 2022-07-31 DIAGNOSIS — M25562 Pain in left knee: Secondary | ICD-10-CM | POA: Diagnosis not present

## 2022-07-31 DIAGNOSIS — M9901 Segmental and somatic dysfunction of cervical region: Secondary | ICD-10-CM | POA: Diagnosis not present

## 2022-07-31 DIAGNOSIS — M9903 Segmental and somatic dysfunction of lumbar region: Secondary | ICD-10-CM | POA: Diagnosis not present

## 2022-07-31 DIAGNOSIS — M545 Low back pain, unspecified: Secondary | ICD-10-CM | POA: Diagnosis not present

## 2022-07-31 DIAGNOSIS — M50322 Other cervical disc degeneration at C5-C6 level: Secondary | ICD-10-CM | POA: Diagnosis not present

## 2022-08-04 DIAGNOSIS — M9901 Segmental and somatic dysfunction of cervical region: Secondary | ICD-10-CM | POA: Diagnosis not present

## 2022-08-04 DIAGNOSIS — M9903 Segmental and somatic dysfunction of lumbar region: Secondary | ICD-10-CM | POA: Diagnosis not present

## 2022-08-04 DIAGNOSIS — M5137 Other intervertebral disc degeneration, lumbosacral region: Secondary | ICD-10-CM | POA: Diagnosis not present

## 2022-08-04 DIAGNOSIS — M50322 Other cervical disc degeneration at C5-C6 level: Secondary | ICD-10-CM | POA: Diagnosis not present

## 2022-08-05 DIAGNOSIS — M9903 Segmental and somatic dysfunction of lumbar region: Secondary | ICD-10-CM | POA: Diagnosis not present

## 2022-08-05 DIAGNOSIS — M5137 Other intervertebral disc degeneration, lumbosacral region: Secondary | ICD-10-CM | POA: Diagnosis not present

## 2022-08-05 DIAGNOSIS — M9901 Segmental and somatic dysfunction of cervical region: Secondary | ICD-10-CM | POA: Diagnosis not present

## 2022-08-05 DIAGNOSIS — M50322 Other cervical disc degeneration at C5-C6 level: Secondary | ICD-10-CM | POA: Diagnosis not present

## 2022-08-06 DIAGNOSIS — M5416 Radiculopathy, lumbar region: Secondary | ICD-10-CM | POA: Diagnosis not present

## 2022-08-06 DIAGNOSIS — M542 Cervicalgia: Secondary | ICD-10-CM | POA: Diagnosis not present

## 2022-08-07 DIAGNOSIS — M5137 Other intervertebral disc degeneration, lumbosacral region: Secondary | ICD-10-CM | POA: Diagnosis not present

## 2022-08-07 DIAGNOSIS — M9903 Segmental and somatic dysfunction of lumbar region: Secondary | ICD-10-CM | POA: Diagnosis not present

## 2022-08-07 DIAGNOSIS — M50322 Other cervical disc degeneration at C5-C6 level: Secondary | ICD-10-CM | POA: Diagnosis not present

## 2022-08-07 DIAGNOSIS — M9901 Segmental and somatic dysfunction of cervical region: Secondary | ICD-10-CM | POA: Diagnosis not present

## 2022-08-10 DIAGNOSIS — R69 Illness, unspecified: Secondary | ICD-10-CM | POA: Diagnosis not present

## 2022-08-11 ENCOUNTER — Ambulatory Visit: Payer: Medicare HMO | Admitting: Cardiology

## 2022-08-11 ENCOUNTER — Encounter: Payer: Self-pay | Admitting: Cardiology

## 2022-08-11 VITALS — BP 110/68 | HR 55 | Resp 16 | Ht 71.0 in | Wt 228.6 lb

## 2022-08-11 DIAGNOSIS — E6609 Other obesity due to excess calories: Secondary | ICD-10-CM

## 2022-08-11 DIAGNOSIS — N1831 Chronic kidney disease, stage 3a: Secondary | ICD-10-CM | POA: Diagnosis not present

## 2022-08-11 DIAGNOSIS — Z6831 Body mass index (BMI) 31.0-31.9, adult: Secondary | ICD-10-CM | POA: Diagnosis not present

## 2022-08-11 DIAGNOSIS — I452 Bifascicular block: Secondary | ICD-10-CM

## 2022-08-11 DIAGNOSIS — I10 Essential (primary) hypertension: Secondary | ICD-10-CM | POA: Diagnosis not present

## 2022-08-11 DIAGNOSIS — I480 Paroxysmal atrial fibrillation: Secondary | ICD-10-CM | POA: Diagnosis not present

## 2022-08-11 MED ORDER — NALTREXONE HCL 50 MG PO TABS: 50.0000 mg | ORAL_TABLET | Freq: Every day | ORAL | 2 refills | Status: DC

## 2022-08-11 NOTE — Progress Notes (Signed)
Primary Physician/Referring:  Emilio Aspen, MD  Patient ID: Christian [name], male    DOB: [birth year], 77 y.o.   MRN: [id number]  Chief Complaint  Patient presents with   Paroxysmal atrial fibrillation    Hypertension   Bifascular block   Follow-up    6 months   HPI:    Christian [name]  is a 77 y.o. Caucasian male  with a history of paroxysmal atrial fibrillation, presently on flecainide 100 mg p.o. b.i.d, history of bilateral venous insufficiency S/P ablation left leg in 2019, hypertension, hyperglycemia and asymptomatic S. Bradycardia.  Patient is here for 73-month office visit, recently he has noticed worsening leg edema with increasing the dose of amlodipine, losartan HCT was changed to losartan due to hyponatremia and also slight worsening renal function.  On further questioning, he has been drinking about at least 3 bourbon drinks in the evening along with snacking.  Denies chest pain, dyspnea, orthopnea, palpitations, syncope.  He discontinued taking anticoagulation since February 2023.  Past Medical History:  Diagnosis Date   Allergic rhinitis    Anticoagulant long-term use    xarelto--- managed by dr Jacinto Halim   Arthritis    Asthma    baldder cancer    Benign localized prostatic hyperplasia with lower urinary tract symptoms (LUTS)    last uti march 2022   Bladder tumor    BPH (benign prostatic hypertrophy)    COVID 05/2019   mild symptoms x 2 days all sx resolved   DDD (degenerative disc disease), cervical    DDD (degenerative disc disease), lumbar    Edema of right lower extremity    wears ted hose   Factor V deficiency (HCC)    05-26-2019 per pt has never had a dvt or pe but has had severeal episodes of superficial venous thrombosis, none recently   Family history of atrial fibrillation    His brother, sister have A. fib, suspects his father also had A. fib   Family history of factor V deficiency    Frequency of urination    GERD (gastroesophageal reflux  disease)    Hematuria    Hernia of abdominal wall 07/17/2020   in stomach asymptomatic per pt   History of chronic bronchitis    Hypertension    followed by pcp  and dr Jacinto Halim   Celine Ahr 09-09-2015 low risk with normal perfusion and ef 60%;  ETT 08-31-2017 normal with no evidence ischemia : both in epic)   Nocturia    PAF (paroxysmal atrial fibrillation) [facility])    cardiologist----  dr Jacinto Halim-- dx 2017   Primary hypertension 02/09/2022   RBBB (right bundle branch block)    Varicose veins of right lower extremity with pain    Venous insufficiency of both lower extremities    vascular--- dr fields--  right > left ;  s/p  ablation and phlebectomies   Wears glasses    Past Surgical History:  Procedure Laterality Date   CATARACT EXTRACTION W/ INTRAOCULAR LENS  IMPLANT, BILATERAL Bilateral 2014   COLONOSCOPY N/A 06/27/2014   Procedure: COLONOSCOPY;  Surgeon: Charolett Bumpers, MD;  Location: [facility];  Service: Endoscopy;  Laterality: N/A;   CYSTOSCOPY WITH FULGERATION N/A 06/01/2019   Procedure: CYSTOSCOPY WITH BLADDER BIOPSY/ FULGERATION;  Surgeon: Rene Paci, MD;  Location: [facility];  Service: Urology;  Laterality: N/A;   ENDOVENOUS ABLATION SAPHENOUS VEIN W/ LASER Right 06/30/2016   EVLA R GSV by Josephina Gip MD    ENDOVENOUS  ABLATION SAPHENOUS VEIN W/ LASER Left 07/14/2016   endovenous laser ablation Left greater saphenous vein by Josephina Gip MD    REPAIR CONGENITAL URETHRAL DEFECT  INFANT   SKIN BIOPSY  07/17/2020   right knee biopsy of severe atypsia x 3 bx surgery planned for 08-2020   stab phlebectomy Left 11/24/2016   stab phlebectomy > 20 incisions left leg by Josephina Gip MD    stab phlebectomy  Right `12-29-2016   stab phlebectomy > 20 incisions right leg by Josephina Gip MD    THULIUM LASER TURP (TRANSURETHRAL RESECTION OF PROSTATE) N/A 07/20/2020   Procedure: Morton Peters LASER TURP (TRANSURETHRAL RESECTION OF PROSTATE), CYSTOSCOPY;  Surgeon:  Rene Paci, MD;  Location: [facility];  Service: Urology;  Laterality: N/A;  ONLY NEEDS 60 MIN   TRANSURETHRAL RESECTION OF PROSTATE  03/10/2011   Procedure: TRANSURETHRAL RESECTION OF THE PROSTATE WITH GYRUS INSTRUMENTS;  Surgeon: Valetta Fuller, MD;  Location: [facility];  Service: Urology;  Laterality: N/A;  [place] TURP OWER    Social History   Tobacco Use   Smoking status: Former    Current packs/day: 0.00    Average packs/day: 1 pack/day for 20.0 years (20.0 ttl pk-yrs)    Types: Cigarettes    Start date: 02/28/1975    Quit date: 02/28/1995    Years since quitting: 27.4   Smokeless tobacco: Never  Substance Use Topics   Alcohol use: Yes    Alcohol/week: 14.0 standard drinks of alcohol    Types: 14 Shots of liquor per week    Comment: 2 or 3 drinks daily  Marital Status: Married  ROS  Review of Systems  Cardiovascular:  Positive for leg swelling (mild, bilateral ankles). Negative for chest pain, dyspnea on exertion, palpitations and syncope.  Musculoskeletal:  Positive for back pain (chronic) and joint pain (bilataral knee).   Objective      08/11/2022   10:54 AM 02/10/2022   10:26 AM 11/27/2021    9:32 AM  Vitals with BMI  Height 5\' 11"  5\' 11"  5\' 11"   Weight 228 lbs 10 oz 227 lbs 13 oz 228 lbs  BMI 31.9 31.79 31.81  Systolic 110 118 952  Diastolic 68 69 80  Pulse 55 56 52    Blood pressure 110/68, pulse (!) 55, resp. rate 16, height 5\' 11"  (1.803 m), weight 228 lb 9.6 oz (103.7 kg), SpO2 98%. Body mass index is 31.88 kg/m.   Physical Exam Constitutional:      Appearance: He is well-developed.     Comments: Mildly obese  Neck:     Vascular: No carotid bruit or JVD.  Cardiovascular:     Rate and Rhythm: Regular rhythm. Bradycardia present.     Pulses: Normal pulses and intact distal pulses.     Heart sounds: Normal heart sounds. No murmur heard.    No gallop.  Pulmonary:     Effort: Pulmonary effort is  normal.     Breath sounds: Normal breath sounds.  Abdominal:     General: Bowel sounds are normal.     Palpations: Abdomen is soft.  Musculoskeletal:     Right lower leg: Edema (Bilateral lower extremity varicose veins noted, right worse. 1+ ankle edema) present.     Left lower leg: Edema (Bilateral lower extremity varicose veins noted, right worse. 1+ ankle edema) present.    Radiology: No results found.  Laboratory examination:   External Labs:  Cholesterol, total 137.000 m 02/28/2022 HDL 63.000 mg 02/28/2022  LDL 62.000 mg 02/28/2022 Triglycerides 58.000 mg 02/28/2022  A1C 5.900 mg/ 02/28/2022 TSH 1.850 02/28/2022  Hemoglobin 12.800 g/d 03/26/2022 HCT 38.5, platelets 363.  Normal indicis.  Creatinine, Serum 1.280 mg/ 05/08/2022 BUN 28 CrCl Est 230.48 05/08/2022 eGFR 58.000 calc 05/08/2022 Sodium 131, potassium 4.6.  Compared to 03/24/2022, no significant change.  Potassium 4.600 mg/ 05/08/2022 ALT (SGPT) 18.000 U/L 02/28/2022  Cardiac Studies:   Lexiscan myoview stress test 09/03/2015: 1. Resting EKG demonstrated normal sinus rhythm, leftward axis, right bundle branch block.  Low-voltage complexes.  Stress EKG was nondiagnostic for ischemia as this is a pharmacologic stress test.  Patient attempt treadmill stress testing, however unable to complete stress test due to marked exercise intolerance. 2. The perfusion imaging study demonstrates mild gut uptake artifact in the inferior wall without any demonstrable ischemia or scar.  Left ventricular systolic function calculated by QGS was 60%. This is a low risk study.   [facility] CARDIAC STRESS TEST 12/13/2021  Narrative Exercise treadmill stress test 12/16/2021: Exercise treadmill stress test performed using Bruce protocol.  Patient reached 8.3 METS, and 79% of age predicted maximum heart rate.  Exercise capacity was good.  No chest pain reported.  Normal heart rate and hemodynamic response. No atrial fibrillation seen. Stress EKG showed  sinus tachycardia, RBBB, LAFB, 1-2 mm upsloping ST depressions in leads I, II, aVL, V5, V6 with partial normalization by 2 min into recovery. These changes are equivocal for ischemia. Intermediate risk study. Previous ETT in 2019 reported only RBB related precordial EKG changes at 10.1 METS and achieved 82% MPHR.Marland Kitchen   [facility] ECHOCARDIOGRAM COMPLETE 12/13/2021  Narrative Echocardiogram 12/13/2021: Study Quality: Good but no subcostal images. Normal LV systolic function with visual EF 60-65%. Left ventricle cavity is normal in size. Normal left ventricular wall thickness. Normal global wall motion. Normal diastolic filling pattern, normal LAP. Mild tricuspid regurgitation. No evidence of pulmonary hypertension. RVSP measures 33 mmHg. Mild pulmonic regurgitation. Compared to 09/05/2015 otherwise no significant change.    EKG:   EKG 08/11/2022: Marked sinus bradycardia at rate of 53 bpm, right bundle branch block.  Compared to 02/10/2022, no change, previous heart rate is 49 bpm.  Allergies & Medications   Allergies  Allergen Reactions   Gin [Alcohol] Other (See Comments)    "Pins and needles feeling and blotches on my neck"   Other     Bcg caused blood clot and flu like symptoms   Adhesive [Tape] Other (See Comments)    Redness/ irritation   Lobster [Shellfish Allergy] Hives    And per pt positive Allergy test    Current Outpatient Medications:    acetaminophen (TYLENOL) 650 MG CR tablet, Take 1,300 mg by mouth in the morning and at bedtime., Disp: , Rfl:    amLODipine (NORVASC) 5 MG tablet, Take 2.5 mg by mouth at bedtime., Disp: , Rfl:    atorvastatin (LIPITOR) 10 MG tablet, TAKE 1 TABLET BY MOUTH EVERY DAY, Disp: 90 tablet, Rfl: 2   cephALEXin (KEFLEX) 250 MG capsule, Take 250 mg by mouth every morning., Disp: , Rfl:    Cholecalciferol 125 MCG (5000 UT) capsule, Take 5,000 Units by mouth at bedtime. , Disp: , Rfl:    Coenzyme Q10 (COQ10) 100 MG CAPS, Take 1 capsule by mouth at  bedtime., Disp: , Rfl:    diclofenac (VOLTAREN) 50 MG EC tablet, Take 50 mg by mouth 2 (two) times daily., Disp: , Rfl:    flecainide (TAMBOCOR) 100 MG tablet, TAKE ONE TABLET BY MOUTH AT  BREAKFAST AND AT BEDTIME, Disp: 180 tablet, Rfl: 3   fluticasone (FLONASE) 50 MCG/ACT nasal spray, Place 1-2 sprays into both nostrils daily., Disp: , Rfl:    gabapentin (NEURONTIN) 300 MG capsule, Take 300 mg by mouth daily at 6 (six) AM., Disp: , Rfl:    levocetirizine (XYZAL) 5 MG tablet, Take 5 mg by mouth at bedtime., Disp: , Rfl:    losartan (COZAAR) 100 MG tablet, Take 100 mg by mouth daily., Disp: , Rfl:    melatonin 5 MG TABS, Take 5 mg by mouth at bedtime., Disp: , Rfl:    metoprolol tartrate (LOPRESSOR) 25 MG tablet, Take 25 mg by mouth 2 (two) times daily., Disp: , Rfl:    montelukast (SINGULAIR) 10 MG tablet, Take 10 mg by mouth at bedtime., Disp: , Rfl: 3   Multiple Vitamins-Minerals (CENTRUM SILVER PO), Take 1 tablet by mouth at bedtime. , Disp: , Rfl:    naltrexone (DEPADE) 50 MG tablet, Take 1 tablet (50 mg total) by mouth daily., Disp: 30 tablet, Rfl: 2   omeprazole (PRILOSEC) 20 MG capsule, Take 20 mg by mouth every other day. At in am, Disp: , Rfl:    sildenafil (VIAGRA) 50 MG tablet, Take 20 mg by mouth at bedtime. Qhs also takes prn also, Disp: , Rfl:    SYMBICORT 160-4.5 MCG/ACT inhaler, Inhale 2 puffs into the lungs in the morning and at bedtime. , Disp: , Rfl:    tamsulosin (FLOMAX) 0.4 MG CAPS capsule, Take 0.4 mg by mouth in the morning and at bedtime. , Disp: , Rfl:    traMADol (ULTRAM) 50 MG tablet, Take 50 mg by mouth as needed., Disp: , Rfl:    zolpidem (AMBIEN) 5 MG tablet, Take 5 mg by mouth at bedtime as needed for sleep. , Disp: , Rfl:    Assessment     ICD-10-CM   1. Paroxysmal atrial fibrillation (HCC)  I48.0 EKG 12-Lead    naltrexone (DEPADE) 50 MG tablet    2. Bifascicular block  I45.2     3. Primary hypertension  I10     4. Stage 3a chronic kidney disease (HCC)   N18.31     5. Class 1 obesity due to excess calories without serious comorbidity with body mass index (BMI) of 31.0 to 31.9 in adult  E66.09 naltrexone (DEPADE) 50 MG tablet   Z68.31       CHA2DS2-VASc Score is 2.  Yearly risk of stroke: 2.3% (A, HTN).  Score of 1=0.6; 2=2.2; 3=3.2; 4=4.8; 5=7.2; 6=9.8; 7=>9.8) -(CHF; HTN; vasc disease DM,  Male = 1; Age <65 =0; 65-74 = 1,  >75 =2; stroke/embolism= 2).   Medications Discontinued During This Encounter  Medication Reason   losartan-hydrochlorothiazide (HYZAAR) 100-12.5 MG tablet Change in therapy   Meds ordered this encounter  Medications   naltrexone (DEPADE) 50 MG tablet    Sig: Take 1 tablet (50 mg total) by mouth daily.    Dispense:  30 tablet    Refill:  2   Recommendations:   Christian [name] is a 77 y.o. male with a history of paroxysmal atrial fibrillation, presently on flecainide 100 mg p.o. b.i.d, history of bilateral venous insufficiency S/P ablation left leg in 2019, hypertension, hyperglycemia and asymptomatic S. Bradycardia.  1. Paroxysmal atrial fibrillation [facility]) Patient is maintaining sinus rhythm.  In view of obesity, hypertension, I would like to try naltrexone for weight loss, he also likes to drink about 3 drinks of bourbon daily, also he likes  to have snacks during drinking in the evening, we have discussed regarding lifestyle changes.  I suspect with additional naltrexone which will help with both alcohol and also weight loss, I suspect his blood pressure will improve and he may have to decrease amlodipine which is causing him to have leg edema.  - EKG 12-Lead - naltrexone (DEPADE) 50 MG tablet; Take 1 tablet (50 mg total) by mouth daily.  Dispense: 30 tablet; Refill: 2  2. Bifascicular block Patient's EKG has remained stable with bifascicular block.  No indication for pacemaker.  He remains asymptomatic.  3. Primary hypertension Blood pressure is well-controlled on present medical regimen, he now has stage III  chronic kidney disease.  Also with increasing the dose of amlodipine he is developing leg edema.  Weight loss will certainly help with primary prevention of issues with hypertension, I have added naltrexone.  He will discontinue or reduce the dose of amlodipine if blood pressure is controlled.  4. Stage 3a chronic kidney disease (HCC) Patient is presently on losartan, advised him to continue the same, cardiovascular and renal protection of losartan discussed.  5. Class 1 obesity due to excess calories without serious comorbidity with body mass index (BMI) of 31.0 to 31.9 in adult As discussed above naltrexone has been prescribed.  He is aware of the side effects.  I would like to see him back in 6 weeks for follow-up.  He also has arthritis in bilateral knee and also back pain, we discussed regarding water aerobics as well. - naltrexone (DEPADE) 50 MG tablet; Take 1 tablet (50 mg total) by mouth daily.  Dispense: 30 tablet; Refill: 2  Other orders - amLODipine (NORVASC) 5 MG tablet; Take 2.5 mg by mouth at bedtime. - losartan (COZAAR) 100 MG tablet; Take 100 mg by mouth daily. - cephALEXin (KEFLEX) 250 MG capsule; Take 250 mg by mouth every morning.   Yates Decamp, MD, [facility] 08/11/2022, 11:26 AM Office: [phone]

## 2022-08-12 DIAGNOSIS — M9903 Segmental and somatic dysfunction of lumbar region: Secondary | ICD-10-CM | POA: Diagnosis not present

## 2022-08-12 DIAGNOSIS — E871 Hypo-osmolality and hyponatremia: Secondary | ICD-10-CM | POA: Diagnosis not present

## 2022-08-12 DIAGNOSIS — M9901 Segmental and somatic dysfunction of cervical region: Secondary | ICD-10-CM | POA: Diagnosis not present

## 2022-08-12 DIAGNOSIS — M5137 Other intervertebral disc degeneration, lumbosacral region: Secondary | ICD-10-CM | POA: Diagnosis not present

## 2022-08-12 DIAGNOSIS — M50322 Other cervical disc degeneration at C5-C6 level: Secondary | ICD-10-CM | POA: Diagnosis not present

## 2022-08-13 DIAGNOSIS — M9903 Segmental and somatic dysfunction of lumbar region: Secondary | ICD-10-CM | POA: Diagnosis not present

## 2022-08-13 DIAGNOSIS — M9901 Segmental and somatic dysfunction of cervical region: Secondary | ICD-10-CM | POA: Diagnosis not present

## 2022-08-13 DIAGNOSIS — M5137 Other intervertebral disc degeneration, lumbosacral region: Secondary | ICD-10-CM | POA: Diagnosis not present

## 2022-08-13 DIAGNOSIS — M50322 Other cervical disc degeneration at C5-C6 level: Secondary | ICD-10-CM | POA: Diagnosis not present

## 2022-08-14 DIAGNOSIS — F1090 Alcohol use, unspecified, uncomplicated: Secondary | ICD-10-CM | POA: Diagnosis not present

## 2022-08-14 DIAGNOSIS — I10 Essential (primary) hypertension: Secondary | ICD-10-CM | POA: Diagnosis not present

## 2022-08-14 DIAGNOSIS — R600 Localized edema: Secondary | ICD-10-CM | POA: Diagnosis not present

## 2022-08-14 DIAGNOSIS — I4891 Unspecified atrial fibrillation: Secondary | ICD-10-CM | POA: Diagnosis not present

## 2022-08-18 DIAGNOSIS — M5137 Other intervertebral disc degeneration, lumbosacral region: Secondary | ICD-10-CM | POA: Diagnosis not present

## 2022-08-18 DIAGNOSIS — M50322 Other cervical disc degeneration at C5-C6 level: Secondary | ICD-10-CM | POA: Diagnosis not present

## 2022-08-18 DIAGNOSIS — M9901 Segmental and somatic dysfunction of cervical region: Secondary | ICD-10-CM | POA: Diagnosis not present

## 2022-08-18 DIAGNOSIS — M9903 Segmental and somatic dysfunction of lumbar region: Secondary | ICD-10-CM | POA: Diagnosis not present

## 2022-08-19 DIAGNOSIS — M9901 Segmental and somatic dysfunction of cervical region: Secondary | ICD-10-CM | POA: Diagnosis not present

## 2022-08-19 DIAGNOSIS — M9903 Segmental and somatic dysfunction of lumbar region: Secondary | ICD-10-CM | POA: Diagnosis not present

## 2022-08-19 DIAGNOSIS — M5137 Other intervertebral disc degeneration, lumbosacral region: Secondary | ICD-10-CM | POA: Diagnosis not present

## 2022-08-19 DIAGNOSIS — M50322 Other cervical disc degeneration at C5-C6 level: Secondary | ICD-10-CM | POA: Diagnosis not present

## 2022-08-20 DIAGNOSIS — M1712 Unilateral primary osteoarthritis, left knee: Secondary | ICD-10-CM | POA: Diagnosis not present

## 2022-08-21 DIAGNOSIS — M50322 Other cervical disc degeneration at C5-C6 level: Secondary | ICD-10-CM | POA: Diagnosis not present

## 2022-08-21 DIAGNOSIS — M9903 Segmental and somatic dysfunction of lumbar region: Secondary | ICD-10-CM | POA: Diagnosis not present

## 2022-08-21 DIAGNOSIS — M9901 Segmental and somatic dysfunction of cervical region: Secondary | ICD-10-CM | POA: Diagnosis not present

## 2022-08-21 DIAGNOSIS — M5137 Other intervertebral disc degeneration, lumbosacral region: Secondary | ICD-10-CM | POA: Diagnosis not present

## 2022-08-25 ENCOUNTER — Encounter: Payer: Self-pay | Admitting: Cardiology

## 2022-08-25 DIAGNOSIS — M50322 Other cervical disc degeneration at C5-C6 level: Secondary | ICD-10-CM | POA: Diagnosis not present

## 2022-08-25 DIAGNOSIS — M5137 Other intervertebral disc degeneration, lumbosacral region: Secondary | ICD-10-CM | POA: Diagnosis not present

## 2022-08-25 DIAGNOSIS — M9903 Segmental and somatic dysfunction of lumbar region: Secondary | ICD-10-CM | POA: Diagnosis not present

## 2022-08-25 DIAGNOSIS — M9901 Segmental and somatic dysfunction of cervical region: Secondary | ICD-10-CM | POA: Diagnosis not present

## 2022-08-26 ENCOUNTER — Encounter: Payer: Self-pay | Admitting: Cardiology

## 2022-08-26 DIAGNOSIS — M5412 Radiculopathy, cervical region: Secondary | ICD-10-CM | POA: Diagnosis not present

## 2022-08-26 NOTE — Telephone Encounter (Signed)
From weight

## 2022-08-26 NOTE — Telephone Encounter (Signed)
From patient.

## 2022-08-28 DIAGNOSIS — M9903 Segmental and somatic dysfunction of lumbar region: Secondary | ICD-10-CM | POA: Diagnosis not present

## 2022-08-28 DIAGNOSIS — M50322 Other cervical disc degeneration at C5-C6 level: Secondary | ICD-10-CM | POA: Diagnosis not present

## 2022-08-28 DIAGNOSIS — M9901 Segmental and somatic dysfunction of cervical region: Secondary | ICD-10-CM | POA: Diagnosis not present

## 2022-08-28 DIAGNOSIS — M5137 Other intervertebral disc degeneration, lumbosacral region: Secondary | ICD-10-CM | POA: Diagnosis not present

## 2022-09-01 DIAGNOSIS — M9901 Segmental and somatic dysfunction of cervical region: Secondary | ICD-10-CM | POA: Diagnosis not present

## 2022-09-01 DIAGNOSIS — M9903 Segmental and somatic dysfunction of lumbar region: Secondary | ICD-10-CM | POA: Diagnosis not present

## 2022-09-01 DIAGNOSIS — M5137 Other intervertebral disc degeneration, lumbosacral region: Secondary | ICD-10-CM | POA: Diagnosis not present

## 2022-09-01 DIAGNOSIS — M50322 Other cervical disc degeneration at C5-C6 level: Secondary | ICD-10-CM | POA: Diagnosis not present

## 2022-09-16 DIAGNOSIS — D1722 Benign lipomatous neoplasm of skin and subcutaneous tissue of left arm: Secondary | ICD-10-CM | POA: Diagnosis not present

## 2022-09-16 DIAGNOSIS — L565 Disseminated superficial actinic porokeratosis (DSAP): Secondary | ICD-10-CM | POA: Diagnosis not present

## 2022-09-16 DIAGNOSIS — L821 Other seborrheic keratosis: Secondary | ICD-10-CM | POA: Diagnosis not present

## 2022-09-16 DIAGNOSIS — L570 Actinic keratosis: Secondary | ICD-10-CM | POA: Diagnosis not present

## 2022-09-16 DIAGNOSIS — L720 Epidermal cyst: Secondary | ICD-10-CM | POA: Diagnosis not present

## 2022-09-16 DIAGNOSIS — L578 Other skin changes due to chronic exposure to nonionizing radiation: Secondary | ICD-10-CM | POA: Diagnosis not present

## 2022-09-16 DIAGNOSIS — D1730 Benign lipomatous neoplasm of skin and subcutaneous tissue of unspecified sites: Secondary | ICD-10-CM | POA: Diagnosis not present

## 2022-09-24 DIAGNOSIS — M25562 Pain in left knee: Secondary | ICD-10-CM | POA: Diagnosis not present

## 2022-09-25 ENCOUNTER — Ambulatory Visit: Payer: Medicare HMO | Admitting: Cardiology

## 2022-09-29 DIAGNOSIS — Z0100 Encounter for examination of eyes and vision without abnormal findings: Secondary | ICD-10-CM | POA: Diagnosis not present

## 2022-10-09 ENCOUNTER — Encounter: Payer: Self-pay | Admitting: Cardiology

## 2022-10-17 DIAGNOSIS — M25562 Pain in left knee: Secondary | ICD-10-CM | POA: Diagnosis not present

## 2022-10-17 DIAGNOSIS — M1711 Unilateral primary osteoarthritis, right knee: Secondary | ICD-10-CM | POA: Diagnosis not present

## 2022-10-18 DIAGNOSIS — L03116 Cellulitis of left lower limb: Secondary | ICD-10-CM | POA: Diagnosis not present

## 2022-10-21 DIAGNOSIS — M5412 Radiculopathy, cervical region: Secondary | ICD-10-CM | POA: Diagnosis not present

## 2022-10-22 DIAGNOSIS — L0390 Cellulitis, unspecified: Secondary | ICD-10-CM | POA: Diagnosis not present

## 2022-10-27 DIAGNOSIS — Z8551 Personal history of malignant neoplasm of bladder: Secondary | ICD-10-CM | POA: Diagnosis not present

## 2022-10-27 DIAGNOSIS — N400 Enlarged prostate without lower urinary tract symptoms: Secondary | ICD-10-CM | POA: Diagnosis not present

## 2022-10-29 ENCOUNTER — Ambulatory Visit: Payer: Self-pay | Admitting: Cardiology

## 2022-10-29 DIAGNOSIS — M48061 Spinal stenosis, lumbar region without neurogenic claudication: Secondary | ICD-10-CM | POA: Diagnosis not present

## 2022-10-29 DIAGNOSIS — M5412 Radiculopathy, cervical region: Secondary | ICD-10-CM | POA: Diagnosis not present

## 2022-10-31 ENCOUNTER — Telehealth: Payer: Self-pay

## 2022-10-31 DIAGNOSIS — M48061 Spinal stenosis, lumbar region without neurogenic claudication: Secondary | ICD-10-CM | POA: Diagnosis not present

## 2022-10-31 DIAGNOSIS — M5412 Radiculopathy, cervical region: Secondary | ICD-10-CM | POA: Diagnosis not present

## 2022-10-31 NOTE — Telephone Encounter (Signed)
Pre-operative Risk Assessment    Patient Name: Christian [name]  DOB: 06-30-1945 MRN: [id number]   LOV: 08/11/22 - DR. Jacinto Halim NOV: NONE    Request for Surgical Clearance    Procedure:   LEFT TOTAL KNEE REPLACEMENT  Date of Surgery:  Clearance TBD                                 Surgeon:  DR. Marcene Corning Surgeon's Group or Practice Name:  [facility] Phone number:  [phone] Fax number:  [phone]   Type of Clearance Requested:   - Medical    Type of Anesthesia:  Spinal   Additional requests/questions:    Signed, Michaelle Copas   10/31/2022, 5:01 PM

## 2022-11-03 NOTE — Telephone Encounter (Signed)
Patient returning call.

## 2022-11-03 NOTE — Telephone Encounter (Signed)
Left message to call back to schedule a tele pre op appt.  ?

## 2022-11-03 NOTE — Telephone Encounter (Signed)
Name: Christian [name]  DOB: December 08, 1945  MRN: [id number]  Primary Cardiologist: Dr. Jacinto Halim   Preoperative team, please contact this patient and set up a phone call appointment for further preoperative risk assessment. Please obtain consent and complete medication review. Thank you for your help.  I confirm that guidance regarding antiplatelet and oral anticoagulation therapy has been completed and, if necessary, noted below.  None requested.  I also confirmed the patient resides in the state of [place]. As per [facility] Medical Board telemedicine laws, the patient must reside in the state in which the provider is licensed.   Ronney Asters, NP 11/03/2022, 11:20 AM [facility]

## 2022-11-04 ENCOUNTER — Telehealth: Payer: Self-pay | Admitting: *Deleted

## 2022-11-04 DIAGNOSIS — M5412 Radiculopathy, cervical region: Secondary | ICD-10-CM | POA: Diagnosis not present

## 2022-11-04 DIAGNOSIS — M48061 Spinal stenosis, lumbar region without neurogenic claudication: Secondary | ICD-10-CM | POA: Diagnosis not present

## 2022-11-04 NOTE — Telephone Encounter (Signed)
Pt has been scheduled for tele pre op appt 11/26/22. Med rec and consent are done. Pt states he will be out of the country from Sunday this week until 11/25/22.

## 2022-11-04 NOTE — Telephone Encounter (Signed)
Pt has been scheduled for tele pre op appt 11/26/22. Med rec and consent are done. Pt states he will be out of the country from Sunday this week until 11/25/22.     Patient Consent for Virtual Visit        Christian [name] has provided verbal consent on 11/04/2022 for a virtual visit (video or telephone).   CONSENT FOR VIRTUAL VISIT FOR:  Christian [name]  By participating in this virtual visit I agree to the following:  I hereby voluntarily request, consent and authorize [facility] and its employed or contracted physicians, physician assistants, nurse practitioners or other licensed health care professionals (the Practitioner), to provide me with telemedicine health care services (the "Services") as deemed necessary by the treating Practitioner. I acknowledge and consent to receive the Services by the Practitioner via telemedicine. I understand that the telemedicine visit will involve communicating with the Practitioner through live audiovisual communication technology and the disclosure of certain medical information by electronic transmission. I acknowledge that I have been given the opportunity to request an in-person assessment or other available alternative prior to the telemedicine visit and am voluntarily participating in the telemedicine visit.  I understand that I have the right to withhold or withdraw my consent to the use of telemedicine in the course of my care at any time, without affecting my right to future care or treatment, and that the Practitioner or I may terminate the telemedicine visit at any time. I understand that I have the right to inspect all information obtained and/or recorded in the course of the telemedicine visit and may receive copies of available information for a reasonable fee.  I understand that some of the potential risks of receiving the Services via telemedicine include:  Delay or interruption in medical evaluation due to technological equipment failure  or disruption; Information transmitted may not be sufficient (e.g. poor resolution of images) to allow for appropriate medical decision making by the Practitioner; and/or  In rare instances, security protocols could fail, causing a breach of personal health information.  Furthermore, I acknowledge that it is my responsibility to provide information about my medical history, conditions and care that is complete and accurate to the best of my ability. I acknowledge that Practitioner's advice, recommendations, and/or decision may be based on factors not within their control, such as incomplete or inaccurate data provided by me or distortions of diagnostic images or specimens that may result from electronic transmissions. I understand that the practice of medicine is not an exact science and that Practitioner makes no warranties or guarantees regarding treatment outcomes. I acknowledge that a copy of this consent can be made available to me via my patient portal [facility] MyChart), or I can request a printed copy by calling the office of [facility].    I understand that my insurance will be billed for this visit.   I have read or had this consent read to me. I understand the contents of this consent, which adequately explains the benefits and risks of the Services being provided via telemedicine.  I have been provided ample opportunity to ask questions regarding this consent and the Services and have had my questions answered to my satisfaction. I give my informed consent for the services to be provided through the use of telemedicine in my medical care

## 2022-11-06 ENCOUNTER — Encounter: Payer: Self-pay | Admitting: Podiatry

## 2022-11-06 ENCOUNTER — Ambulatory Visit: Payer: Medicare HMO | Admitting: Podiatry

## 2022-11-06 DIAGNOSIS — L600 Ingrowing nail: Secondary | ICD-10-CM | POA: Diagnosis not present

## 2022-11-06 DIAGNOSIS — M5412 Radiculopathy, cervical region: Secondary | ICD-10-CM | POA: Diagnosis not present

## 2022-11-06 DIAGNOSIS — M48061 Spinal stenosis, lumbar region without neurogenic claudication: Secondary | ICD-10-CM | POA: Diagnosis not present

## 2022-11-06 NOTE — Telephone Encounter (Signed)
I called the pt back who asked if we could do the call while he is on his cruise. I explained as he will be out of the country we cannot; reason being is the PA/NP is not licensed in the country or state. The pt is not returning from cruise until 11/25/22 in the evening. Unless I get an opening tomorrow 11/07/22 the earliest will be 11/26/22. Pt thanked me for the call back.

## 2022-11-06 NOTE — Telephone Encounter (Signed)
Patient is asking that you give him a call back. Please advise

## 2022-11-07 ENCOUNTER — Other Ambulatory Visit: Payer: Self-pay

## 2022-11-07 DIAGNOSIS — I480 Paroxysmal atrial fibrillation: Secondary | ICD-10-CM

## 2022-11-07 DIAGNOSIS — E6609 Other obesity due to excess calories: Secondary | ICD-10-CM

## 2022-11-07 NOTE — Telephone Encounter (Signed)
[facility] is requesting a refill on naltrexone 50 mg tablet. This is a non cardiac medication. Would Dr. Jacinto Halim like to refill this medication? Please address

## 2022-11-07 NOTE — Progress Notes (Signed)
Subjective:   Patient ID: Christian [name], male   DOB: 77 y.o.   MRN: [id number]   HPI Patient presents with a painful nail right that he states that he is not sure if it is ingrown and he is not sure what to do with it.  Patient states it has been present for around a month patient does not smoke likes to be active   Review of Systems  All other systems reviewed and are negative.       Objective:  Physical Exam Vitals and nursing note reviewed.  Constitutional:      Appearance: He is well-developed.  Pulmonary:     Effort: Pulmonary effort is normal.  Musculoskeletal:        General: Normal range of motion.  Skin:    General: Skin is warm.  Neurological:     Mental Status: He is alert.     Neurovascular status intact muscle strength was found to be adequate range of motion adequate with patient found to have incurvated right hallux that is painful when pressed localized no erythema edema drainage noted with mild thickness of the nailbed.  Good digital perfusion     Assessment:  Ingrown toenail deformity right hallux with possibility for other pathology and may need correction     Plan:  H&P reviewed condition organ to start him on soaks wider shoes and give it 3 weeks as he is going on vacation.  If symptoms persist then a permanent procedure will probably be necessary will be evaluated at that time

## 2022-11-10 NOTE — Telephone Encounter (Signed)
Per pt MyChart encounter - he has discontinued

## 2022-11-17 NOTE — Telephone Encounter (Signed)
Hey this medication is still on pt's medication list. Please address

## 2022-11-25 ENCOUNTER — Other Ambulatory Visit: Payer: Self-pay | Admitting: Orthopaedic Surgery

## 2022-11-25 NOTE — Progress Notes (Unsigned)
Virtual Visit via Telephone Note   Because of Christian [name]'s co-morbid illnesses, he is at least at moderate risk for complications without adequate follow up.  This format is felt to be most appropriate for this patient at this time.  The patient did not have access to video technology/had technical difficulties with video requiring transitioning to audio format only (telephone).  All issues noted in this document were discussed and addressed.  No physical exam could be performed with this format.  Please refer to the patient's chart for his consent to telehealth for [facility].  Evaluation Performed:  Preoperative cardiovascular risk assessment _____________   Date:  11/26/2022   Patient ID:  Christian [name], DOB [birth year], MRN [id number] Patient Location:  Home Provider location:   Office  Primary Care Provider:  Emilio Aspen, MD Primary Cardiologist:  [facility]  Chief Complaint / Patient Profile   77 y.o. y/o male with a h/o paroxysmal atrial fibrillation on flecainide, bilateral venous insufficiency status post ablation of left leg in 2019, hypertension, hypercholesterolemia and asymptomatic sinus bradycardia with bifascicular block.Marland KitchenHe is not on anticoagulant therapy.  He is pending left total knee replacement by Dr. Jerl Santos on 12/09/2022 and presents today for telephonic preoperative cardiovascular risk assessment.  History of Present Illness    Christian [name] is a 77 y.o. male who presents via audio/video conferencing for a telehealth visit today.  Pt was last seen in cardiology clinic on 08/11/2022 by Ganji.  At that time DARRILL LAZO was doing well and had medications adjusted with addition of naltrexone.Marland Kitchen He discontinued due to continuation of ETOH and need to take tramadol for chronic pain. Stopped on September 06, 2022. The patient is now pending procedure as outlined above. Since his last visit, he continues to have chronic lower extremity edema.  His  activity is limited due to his knee pain.  He is able to complete ADLs and do work around his home, carry heavy milk jugs.  He is just returned from a cruise and had no issues from a cardiac standpoint.  Past Medical History    Past Medical History:  Diagnosis Date   Allergic rhinitis    Anticoagulant long-term use    xarelto--- managed by dr Jacinto Halim   Arthritis    Asthma    baldder cancer    Benign localized prostatic hyperplasia with lower urinary tract symptoms (LUTS)    last uti march 2022   Bladder tumor    BPH (benign prostatic hypertrophy)    COVID 05/2019   mild symptoms x 2 days all sx resolved   DDD (degenerative disc disease), cervical    DDD (degenerative disc disease), lumbar    Edema of right lower extremity    wears ted hose   Factor V deficiency (HCC)    05-26-2019 per pt has never had a dvt or pe but has had severeal episodes of superficial venous thrombosis, none recently   Family history of atrial fibrillation    His brother, sister have A. fib, suspects his father also had A. fib   Family history of factor V deficiency    Frequency of urination    GERD (gastroesophageal reflux disease)    Hematuria    Hernia of abdominal wall 07/17/2020   in stomach asymptomatic per pt   History of chronic bronchitis    Hypertension    followed by pcp  and dr Jacinto Halim   Celine Ahr 09-09-2015 low risk with normal perfusion and ef 60%;  ETT 08-31-2017 normal with no evidence ischemia : both in epic)   Nocturia    PAF (paroxysmal atrial fibrillation) [facility])    cardiologist----  dr Jacinto Halim-- dx 2017   Primary hypertension 02/09/2022   RBBB (right bundle branch block)    Varicose veins of right lower extremity with pain    Venous insufficiency of both lower extremities    vascular--- dr fields--  right > left ;  s/p  ablation and phlebectomies   Wears glasses    Past Surgical History:  Procedure Laterality Date   CATARACT EXTRACTION W/ INTRAOCULAR LENS  IMPLANT, BILATERAL Bilateral  2014   COLONOSCOPY N/A 06/27/2014   Procedure: COLONOSCOPY;  Surgeon: Charolett Bumpers, MD;  Location: [facility];  Service: Endoscopy;  Laterality: N/A;   CYSTOSCOPY WITH FULGERATION N/A 06/01/2019   Procedure: CYSTOSCOPY WITH BLADDER BIOPSY/ FULGERATION;  Surgeon: Rene Paci, MD;  Location: [facility];  Service: Urology;  Laterality: N/A;   ENDOVENOUS ABLATION SAPHENOUS VEIN W/ LASER Right 06/30/2016   EVLA R GSV by Josephina Gip MD    ENDOVENOUS ABLATION SAPHENOUS VEIN W/ LASER Left 07/14/2016   endovenous laser ablation Left greater saphenous vein by Josephina Gip MD    REPAIR CONGENITAL URETHRAL DEFECT  INFANT   SKIN BIOPSY  07/17/2020   right knee biopsy of severe atypsia x 3 bx surgery planned for 08-2020   stab phlebectomy Left 11/24/2016   stab phlebectomy > 20 incisions left leg by Josephina Gip MD    stab phlebectomy  Right `12-29-2016   stab phlebectomy > 20 incisions right leg by Josephina Gip MD    THULIUM LASER TURP (TRANSURETHRAL RESECTION OF PROSTATE) N/A 07/20/2020   Procedure: Morton Peters LASER TURP (TRANSURETHRAL RESECTION OF PROSTATE), CYSTOSCOPY;  Surgeon: Rene Paci, MD;  Location: [facility];  Service: Urology;  Laterality: N/A;  ONLY NEEDS 60 MIN   TRANSURETHRAL RESECTION OF PROSTATE  03/10/2011   Procedure: TRANSURETHRAL RESECTION OF THE PROSTATE WITH GYRUS INSTRUMENTS;  Surgeon: Valetta Fuller, MD;  Location: [facility];  Service: Urology;  Laterality: N/A;  Gyrus-Saline TURP OWER     Allergies  Allergies  Allergen Reactions   Gin [Alcohol] Other (See Comments)    "Pins and needles feeling and blotches on my neck"   Other     Bcg caused blood clot and flu like symptoms   Adhesive [Tape] Other (See Comments)    Redness/ irritation   Lobster [Shellfish Allergy] Hives    And per pt positive Allergy test    Home Medications    Prior to Admission medications   Medication Sig Start Date  End Date Taking? Authorizing Provider  acetaminophen (TYLENOL) 650 MG CR tablet Take 1,300 mg by mouth in the morning and at bedtime.    [provider]  amLODipine (NORVASC) 5 MG tablet Take 2.5 mg by mouth at bedtime. 07/29/22   [provider]  atorvastatin (LIPITOR) 10 MG tablet TAKE 1 TABLET BY MOUTH EVERY DAY 09/21/18   Yates Decamp, MD  cephALEXin (KEFLEX) 250 MG capsule Take 250 mg by mouth every morning. 07/29/22   [provider]  Cholecalciferol 125 MCG (5000 UT) capsule Take 5,000 Units by mouth at bedtime.     [provider]  Coenzyme Q10 (COQ10) 100 MG CAPS Take 1 capsule by mouth at bedtime.    [provider]  diclofenac (VOLTAREN) 50 MG EC tablet Take 50 mg by mouth 2 (two) times daily. 11/12/21  [provider]  flecainide (TAMBOCOR) 100 MG tablet TAKE ONE TABLET BY MOUTH AT [facility] AND AT BEDTIME 07/25/22   Yates Decamp, MD  fluticasone (FLONASE) 50 MCG/ACT nasal spray Place 1-2 sprays into both nostrils daily.    [provider]  gabapentin (NEURONTIN) 300 MG capsule Take 300 mg by mouth daily at 6 (six) AM. 11/11/21   [provider]  levocetirizine (XYZAL) 5 MG tablet Take 5 mg by mouth at bedtime.    [provider]  losartan (COZAAR) 100 MG tablet Take 100 mg by mouth daily. 07/29/22   [provider]  melatonin 5 MG TABS Take 5 mg by mouth at bedtime.    [provider]  metoprolol tartrate (LOPRESSOR) 25 MG tablet Take 25 mg by mouth 2 (two) times daily.    [provider]  montelukast (SINGULAIR) 10 MG tablet Take 10 mg by mouth at bedtime. 08/16/15   [provider]  Multiple Vitamins-Minerals (CENTRUM SILVER PO) Take 1 tablet by mouth at bedtime.     [provider]  omeprazole (PRILOSEC) 20 MG capsule Take 20 mg by mouth every other day. At in am    [provider]  sildenafil (VIAGRA) 50 MG tablet Take 20 mg by mouth at bedtime. Qhs also takes  prn also    [provider]  SYMBICORT 160-4.5 MCG/ACT inhaler Inhale 2 puffs into the lungs in the morning and at bedtime.  02/21/17   [provider]  tadalafil (CIALIS) 5 MG tablet Take 5 mg by mouth daily at 12 noon. 10/28/22   [provider]  tamsulosin (FLOMAX) 0.4 MG CAPS capsule Take 0.4 mg by mouth in the morning and at bedtime.    [provider]  traMADol (ULTRAM) 50 MG tablet Take 50 mg by mouth as needed.    [provider]  zolpidem (AMBIEN) 5 MG tablet Take 5 mg by mouth at bedtime as needed for sleep.     [provider]    Physical Exam    Vital Signs:  IZREAL PU does not have vital signs available for review today.  Given telephonic nature of communication, physical exam is limited. AAOx3. NAD. Normal affect.  Speech and respirations are unlabored.  Accessory Clinical Findings    None  Assessment & Plan    1.  Preoperative Cardiovascular Risk Assessment:  According to the Revised Cardiac Risk Index (RCRI), his Perioperative Risk of Major Cardiac Event is (%): 0.9  His Functional Capacity in METs is: 7.34 according to the [facility] Activity Status Index (DASI).   The patient was advised that if he develops new symptoms prior to surgery to contact our office to arrange for a follow-up visit, and he verbalized understanding.  Therefore, based on ACC/AHA guidelines, patient would be at acceptable risk for the planned procedure without further cardiovascular testing. I will route this recommendation to the requesting party via Epic fax function.   A copy of this note will be routed to requesting surgeon.   Time:   Today, I have spent 10 minutes with the patient with telehealth technology discussing medical history, symptoms, and management plan.     Joni Reining, NP  11/26/2022, 9:06 AM      `

## 2022-11-26 ENCOUNTER — Ambulatory Visit: Payer: Medicare HMO | Attending: Cardiology

## 2022-11-26 DIAGNOSIS — Z01818 Encounter for other preprocedural examination: Secondary | ICD-10-CM

## 2022-11-26 DIAGNOSIS — M5412 Radiculopathy, cervical region: Secondary | ICD-10-CM | POA: Diagnosis not present

## 2022-11-26 DIAGNOSIS — Z01810 Encounter for preprocedural cardiovascular examination: Secondary | ICD-10-CM | POA: Diagnosis not present

## 2022-11-26 DIAGNOSIS — M48061 Spinal stenosis, lumbar region without neurogenic claudication: Secondary | ICD-10-CM | POA: Diagnosis not present

## 2022-11-26 NOTE — Patient Instructions (Addendum)
SURGICAL WAITING ROOM VISITATION  Patients having surgery or a procedure may have no more than 2 support people in the waiting area - these visitors may rotate.    Children under the age of 53 must have an adult with them who is not the patient.  Due to an increase in RSV and influenza rates and associated hospitalizations, children ages 46 and under may not visit patients in [facility] hospitals.  If the patient needs to stay at the hospital during part of their recovery, the visitor guidelines for inpatient rooms apply. Pre-op nurse will coordinate an appropriate time for 1 support person to accompany patient in pre-op.  This support person may not rotate.    Please refer to the [facility] website for the visitor guidelines for Inpatients (after your surgery is over and you are in a regular room).       Your procedure is scheduled on:  12/09/22    Report to [facility] Main Entrance    Report to admitting at  [phone] AM   Call this number if you have problems the morning of surgery [phone]   Do not eat food :After Midnight.   After Midnight you may have the following liquids until  0430______ AM  DAY OF SURGERY  Water Non-Citrus Juices (without pulp, NO RED-Apple, White grape, White cranberry) Black Coffee (NO MILK/CREAM OR CREAMERS, sugar ok)  Clear Tea (NO MILK/CREAM OR CREAMERS, sugar ok) regular and decaf                             Plain Jell-O (NO RED)                                           Fruit ices (not with fruit pulp, NO RED)                                     Popsicles (NO RED)                                                               Sports drinks like Gatorade (NO RED)                     The day of surgery:  Drink ONE (1) Pre-Surgery Clear Ensure or G2 at  0430AM ( have completed by )  the morning of surgery. Drink in one sitting. Do not sip.  This drink was given to you during your hospital  pre-op appointment visit. Nothing else to  drink after completing the  Pre-Surgery Clear Ensure or G2.          If you have questions, please contact your surgeon's office.       Oral Hygiene is also important to reduce your risk of infection.                                    Remember - BRUSH YOUR TEETH THE MORNING OF SURGERY  WITH YOUR REGULAR TOOTHPASTE  DENTURES WILL BE REMOVED PRIOR TO SURGERY PLEASE DO NOT APPLY "Poly grip" OR ADHESIVES!!!   Do NOT smoke after Midnight   Stop all vitamins and herbal supplements 7 days before surgery.   Take these medicines the morning of surgery with A SIP OF WATER:   flecainide, flonase, gabapentin, metoprolol, inhalers as usual and bring, flomax, omeprazole if  due to take   DO NOT TAKE ANY ORAL DIABETIC MEDICATIONS DAY OF YOUR SURGERY  Bring CPAP mask and tubing day of surgery.                              You may not have any metal on your body including hair pins, jewelry, and body piercing             Do not wear make-up, lotions, powders, perfumes/cologne, or deodorant  Do not wear nail polish including gel and S&S, artificial/acrylic nails, or any other type of covering on natural nails including finger and toenails. If you have artificial nails, gel coating, etc. that needs to be removed by a nail salon please have this removed prior to surgery or surgery may need to be canceled/ delayed if the surgeon/ anesthesia feels like they are unable to be safely monitored.   Do not shave  48 hours prior to surgery.               Men may shave face and neck.   Do not bring valuables to the hospital. [facility] IS NOT             RESPONSIBLE   FOR VALUABLES.   Contacts, glasses, dentures or bridgework may not be worn into surgery.   Bring small overnight bag day of surgery.   DO NOT BRING YOUR HOME MEDICATIONS TO THE HOSPITAL. PHARMACY WILL DISPENSE MEDICATIONS LISTED ON YOUR MEDICATION LIST TO YOU DURING YOUR ADMISSION IN THE HOSPITAL!    Patients discharged on the day of  surgery will not be allowed to drive home.  Someone NEEDS to stay with you for the first 24 hours after anesthesia.   Special Instructions: Bring a copy of your healthcare power of attorney and living will documents the day of surgery if you haven't scanned them before.              Please read over the following fact sheets you were given: IF YOU HAVE QUESTIONS ABOUT YOUR PRE-OP INSTRUCTIONS PLEASE CALL [phone]   If you received a COVID test during your pre-op visit  it is requested that you wear a mask when out in public, stay away from anyone that may not be feeling well and notify your surgeon if you develop symptoms. If you test positive for Covid or have been in contact with anyone that has tested positive in the last 10 days please notify you surgeon.      Pre-operative 5 CHG Bath Instructions   You can play a key role in reducing the risk of infection after surgery. Your skin needs to be as free of germs as possible. You can reduce the number of germs on your skin by washing with CHG (chlorhexidine gluconate) soap before surgery. CHG is an antiseptic soap that kills germs and continues to kill germs even after washing.   DO NOT use if you have an allergy to chlorhexidine/CHG or antibacterial soaps. If your skin becomes reddened or irritated, stop using the CHG  and notify one of our RNs at [phone].   Please shower with the CHG soap starting 4 days before surgery using the following schedule:     Please keep in mind the following:  DO NOT shave, including legs and underarms, starting the day of your first shower.   You may shave your face at any point before/day of surgery.  Place clean sheets on your bed the day you start using CHG soap. Use a clean washcloth (not used since being washed) for each shower. DO NOT sleep with pets once you start using the CHG.   CHG Shower Instructions:  If you choose to wash your hair and private area, wash first with your normal  shampoo/soap.  After you use shampoo/soap, rinse your hair and body thoroughly to remove shampoo/soap residue.  Turn the water OFF and apply about 3 tablespoons (45 ml) of CHG soap to a CLEAN washcloth.  Apply CHG soap ONLY FROM YOUR NECK DOWN TO YOUR TOES (washing for 3-5 minutes)  DO NOT use CHG soap on face, private areas, open wounds, or sores.  Pay special attention to the area where your surgery is being performed.  If you are having back surgery, having someone wash your back for you may be helpful. Wait 2 minutes after CHG soap is applied, then you may rinse off the CHG soap.  Pat dry with a clean towel  Put on clean clothes/pajamas   If you choose to wear lotion, please use ONLY the CHG-compatible lotions on the back of this paper.     Additional instructions for the day of surgery: DO NOT APPLY any lotions, deodorants, cologne, or perfumes.   Put on clean/comfortable clothes.  Brush your teeth.  Ask your nurse before applying any prescription medications to the skin.      CHG Compatible Lotions   Aveeno Moisturizing lotion  Cetaphil Moisturizing Cream  Cetaphil Moisturizing Lotion  Clairol Herbal Essence Moisturizing Lotion, Dry Skin  Clairol Herbal Essence Moisturizing Lotion, Extra Dry Skin  Clairol Herbal Essence Moisturizing Lotion, Normal Skin  Curel Age Defying Therapeutic Moisturizing Lotion with Alpha Hydroxy  Curel Extreme Care Body Lotion  Curel Soothing Hands Moisturizing Hand Lotion  Curel Therapeutic Moisturizing Cream, Fragrance-Free  Curel Therapeutic Moisturizing Lotion, Fragrance-Free  Curel Therapeutic Moisturizing Lotion, Original Formula  Eucerin Daily Replenishing Lotion  Eucerin Dry Skin Therapy Plus Alpha Hydroxy Crme  Eucerin Dry Skin Therapy Plus Alpha Hydroxy Lotion  Eucerin Original Crme  Eucerin Original Lotion  Eucerin Plus Crme Eucerin Plus Lotion  Eucerin TriLipid Replenishing Lotion  Keri Anti-Bacterial Hand Lotion  Keri Deep  Conditioning Original Lotion Dry Skin Formula Softly Scented  Keri Deep Conditioning Original Lotion, Fragrance Free Sensitive Skin Formula  Keri Lotion Fast Absorbing Fragrance Free Sensitive Skin Formula  Keri Lotion Fast Absorbing Softly Scented Dry Skin Formula  Keri Original Lotion  Keri Skin Renewal Lotion Keri Silky Smooth Lotion  Keri Silky Smooth Sensitive Skin Lotion  Nivea Body Creamy Conditioning Oil  Nivea Body Extra Enriched Teacher, adult education Moisturizing Lotion Nivea Crme  Nivea Skin Firming Lotion  NutraDerm 30 Skin Lotion  NutraDerm Skin Lotion  NutraDerm Therapeutic Skin Cream  NutraDerm Therapeutic Skin Lotion  ProShield Protective Hand Cream  Provon moisturizing lotion

## 2022-11-26 NOTE — Progress Notes (Addendum)
Anesthesia Review:  PCP: Cherene Julian  LOV 08/14/22  Cardiologist : Jacinto Halim LOV 08/11/22 11/26/22- preopappt - telephone visit  Chest x-ray : EKG : 08/11/22  Echo : 12/15/21  Stress test: 12/16/21  Cardiac Cath :  Activity level: can do a flight of stairs without difficulty  Sleep Study/ CPAP : none  Fasting Blood Sugar :      / Checks Blood Sugar -- times a day:   Blood Thinner/ Instructions /Last Dose: ASA / Instructions/ Last Dose :   11/06/22- PT had appt with DR Cristie Hem - right big toe ingrown toenail.  OV in EPIC.  PT continues to soak right great toe.  No infection per pt .  Instructed pt to make sure DR [facility] aware.  PT voiced understanding.   PT with hx of cellulitis right upper thigh.  Recent.  Currently at preop with no issues.  Area clear.  Per pt   PT has had cough/sinus congestion for last 6 weeks.  PT has not been seen by MD.  Recent travel from 11/09/22 - 11/25/22 to [place], [place], etc.  PT to contact DR [facility] office in regards to this.  To make MD aware.     PT with hx of Leiden Factor V- NOT on Xarelto in 2 years per pt.

## 2022-11-27 ENCOUNTER — Ambulatory Visit: Payer: Medicare HMO | Admitting: Podiatry

## 2022-11-27 ENCOUNTER — Ambulatory Visit: Payer: Medicare HMO | Admitting: Cardiology

## 2022-12-01 ENCOUNTER — Other Ambulatory Visit: Payer: Self-pay

## 2022-12-01 ENCOUNTER — Encounter (HOSPITAL_COMMUNITY): Payer: Self-pay

## 2022-12-01 ENCOUNTER — Encounter (HOSPITAL_COMMUNITY)
Admission: RE | Admit: 2022-12-01 | Discharge: 2022-12-01 | Disposition: A | Discharge status: Home / Self Care | Payer: Medicare HMO | Source: Ambulatory Visit | Attending: Orthopaedic Surgery | Admitting: Orthopaedic Surgery

## 2022-12-01 VITALS — BP 118/78 | HR 52 | Temp 98.1°F | Resp 16 | Ht 70.0 in | Wt 222.0 lb

## 2022-12-01 DIAGNOSIS — I10 Essential (primary) hypertension: Secondary | ICD-10-CM | POA: Insufficient documentation

## 2022-12-01 DIAGNOSIS — I452 Bifascicular block: Secondary | ICD-10-CM | POA: Diagnosis not present

## 2022-12-01 DIAGNOSIS — Z9079 Acquired absence of other genital organ(s): Secondary | ICD-10-CM | POA: Insufficient documentation

## 2022-12-01 DIAGNOSIS — I480 Paroxysmal atrial fibrillation: Secondary | ICD-10-CM | POA: Diagnosis not present

## 2022-12-01 DIAGNOSIS — D6851 Activated protein C resistance: Secondary | ICD-10-CM | POA: Diagnosis not present

## 2022-12-01 DIAGNOSIS — Z8551 Personal history of malignant neoplasm of bladder: Secondary | ICD-10-CM | POA: Diagnosis not present

## 2022-12-01 DIAGNOSIS — Z01812 Encounter for preprocedural laboratory examination: Secondary | ICD-10-CM | POA: Insufficient documentation

## 2022-12-01 DIAGNOSIS — Z01818 Encounter for other preprocedural examination: Secondary | ICD-10-CM

## 2022-12-01 DIAGNOSIS — J45909 Unspecified asthma, uncomplicated: Secondary | ICD-10-CM | POA: Diagnosis not present

## 2022-12-01 DIAGNOSIS — K219 Gastro-esophageal reflux disease without esophagitis: Secondary | ICD-10-CM | POA: Insufficient documentation

## 2022-12-01 DIAGNOSIS — Z79899 Other long term (current) drug therapy: Secondary | ICD-10-CM | POA: Diagnosis not present

## 2022-12-01 HISTORY — DX: Activated protein C resistance: D68.51

## 2022-12-01 LAB — BASIC METABOLIC PANEL
Anion gap: 10 (ref 5–15)
BUN: 16 mg/dL (ref 8–23)
CO2: 22 mmol/L (ref 22–32)
Calcium: 9 mg/dL (ref 8.9–10.3)
Chloride: 101 mmol/L (ref 98–111)
Creatinine, Ser: 1.09 mg/dL (ref 0.61–1.24)
GFR, Estimated: >60 mL/min (ref >60)
Glucose, Bld: 101 mg/dL — ABNORMAL HIGH (ref 70–99)
Potassium: 4.5 mmol/L (ref 3.5–5.1)
Sodium: 133 mmol/L — ABNORMAL LOW (ref 135–145)

## 2022-12-01 LAB — CBC
HCT: 41.8 % (ref 39.0–52.0)
Hemoglobin: 14.3 g/dL (ref 13.0–17.0)
MCH: 34 pg (ref 26.0–34.0)
MCHC: 34.2 g/dL (ref 30.0–36.0)
MCV: 99.3 fL (ref 80.0–100.0)
Platelets: 284 10*3/uL (ref 150–400)
RBC: 4.21 MIL/uL — ABNORMAL LOW (ref 4.22–5.81)
RDW: 13.2 % (ref 11.5–15.5)
WBC: 10.5 10*3/uL (ref 4.0–10.5)
nRBC: 0 % (ref 0.0–0.2)

## 2022-12-01 LAB — SURGICAL PCR SCREEN
MRSA, PCR: NEGATIVE
Staphylococcus aureus: NEGATIVE

## 2022-12-02 ENCOUNTER — Encounter (HOSPITAL_COMMUNITY): Payer: Self-pay

## 2022-12-02 DIAGNOSIS — M5412 Radiculopathy, cervical region: Secondary | ICD-10-CM | POA: Diagnosis not present

## 2022-12-02 DIAGNOSIS — M48061 Spinal stenosis, lumbar region without neurogenic claudication: Secondary | ICD-10-CM | POA: Diagnosis not present

## 2022-12-02 NOTE — Care Plan (Signed)
Ortho Bundle Case Management Note  Patient Details  Name: Christian [name] MRN: [id number] Date of Birth: 12-18-1945  spoke with patient and [facility] RN on phone. he will return to his apartment and receive therapy there. he has a rolling walker. discharge instructions discussed and mailed to patient. questions answered. Patient and MD in agreement with plan. CHoice offered                     DME Arranged:    DME Agency:     HH Arranged:    HH Agency:     Additional Comments: Please contact me with any questions of if this plan should need to change.  Shauna Hugh,  RN,BSN,MHA,CCM  [facility]opaedic Specialist  [phone] 12/02/2022, 7:39 AM

## 2022-12-02 NOTE — Anesthesia Preprocedure Evaluation (Addendum)
Anesthesia Evaluation  Patient identified by MRN, date of birth, ID band Patient awake    Reviewed: Allergy & Precautions, NPO status , Patient's Chart, lab work & pertinent test results  History of Anesthesia Complications Negative for: history of anesthetic complications  Airway Mallampati: II  TM Distance: >3 FB Neck ROM: Full    Dental no notable dental hx.    Pulmonary asthma , former smoker   Pulmonary exam normal        Cardiovascular hypertension, Pt. on medications and Pt. on home beta blockers Normal cardiovascular exam+ dysrhythmias      Neuro/Psych negative neurological ROS     GI/Hepatic Neg liver ROS,GERD  Medicated,,  Endo/Other  negative endocrine ROS    Renal/GU negative Renal ROS  negative genitourinary   Musculoskeletal  (+) Arthritis ,    Abdominal   Peds  Hematology Factor V Leiden   Anesthesia Other Findings Day of surgery medications reviewed with patient.  Reproductive/Obstetrics                              Anesthesia Physical Anesthesia Plan  ASA: 2  Anesthesia Plan: Spinal   Post-op Pain Management:  Regional for Post-op pain and Tylenol PO (pre-op)* and Regional block*   Induction:   PONV Risk Score and Plan: 2 and Treatment may vary due to age or medical condition, Ondansetron, Propofol infusion and Dexamethasone  Airway Management Planned: Natural Airway and Simple Face Mask  Additional Equipment: None  Intra-op Plan:   Post-operative Plan:   Informed Consent: I have reviewed the patients History and Physical, chart, labs and discussed the procedure including the risks, benefits and alternatives for the proposed anesthesia with the patient or authorized representative who has indicated his/her understanding and acceptance.       Plan Discussed with: CRNA  Anesthesia Plan Comments: (See PAT note from 11/18 by Sherlie Ban PA-C )          Anesthesia Quick Evaluation

## 2022-12-02 NOTE — Progress Notes (Signed)
DISCUSSION: Christian [name] is a 77 yo male who presents to PAT prior to left TKA on 12/09/22 with Dr. Jerl Santos. PMH of former smoking, HTN, PAF, asthma, GERD, Factor V Leiden, bladder cancer, BPH.  Patient follows with Cardiology for hx of PAF on flecanide, bifasicular block. Last seen by Dr. Jacinto Halim on 08/11/22. Doing well from cardiac standpoint. Does not take anticoagulation. Cleared via telephone visit on 11/26/22:  "Preoperative Cardiovascular Risk Assessment:   According to the Revised Cardiac Risk Index (RCRI), his Perioperative Risk of Major Cardiac Event is (%): 0.9   His Functional Capacity in METs is: 7.34 according to the Duke Activity Status Index (DASI).     Therefore, based on ACC/AHA guidelines, patient would be at acceptable risk for the planned procedure without further cardiovascular testing. I will route this recommendation to the requesting party via Epic fax function."  Follows with PCP for other chronic medical conditions. BP is controlled. Asthma controlled with inhalers. On PPI.  VS: BP 118/78   Pulse (!) 52   Temp 36.7 C (Oral)   Resp 16   Ht 5\' 10"  (1.778 m)   Wt 100.7 kg   SpO2 99%   BMI 31.85 kg/m   PROVIDERS: Emilio Aspen, MD Cardiology: Dr. Jacinto Halim  LABS: Labs reviewed: Acceptable for surgery. (all labs ordered are listed, but only abnormal results are displayed)  Labs Reviewed  CBC - Abnormal; Notable for the following components:      Result Value   RBC 4.21 (*)    All other components within normal limits  BASIC METABOLIC PANEL - Abnormal; Notable for the following components:   Sodium 133 (*)    Glucose, Bld 101 (*)    All other components within normal limits  SURGICAL PCR SCREEN     IMAGES:   EKG 08/11/22  Sinus bradycardia, rate 49 RBBB   CV:  PCV CARDIAC STRESS TEST 12/13/2021   Narrative Exercise treadmill stress test 12/16/2021: Exercise treadmill stress test performed using Bruce protocol.  Patient reached 8.3 METS,  and 79% of age predicted maximum heart rate.  Exercise capacity was good.  No chest pain reported.  Normal heart rate and hemodynamic response. No atrial fibrillation seen. Stress EKG showed sinus tachycardia, RBBB, LAFB, 1-2 mm upsloping ST depressions in leads I, II, aVL, V5, V6 with partial normalization by 2 min into recovery. These changes are equivocal for ischemia. Intermediate risk study. Previous ETT in 2019 reported only RBB related precordial EKG changes at 10.1 METS and achieved 82% MPHR.Marland Kitchen   PCV ECHOCARDIOGRAM COMPLETE 12/13/2021   Narrative Echocardiogram 12/13/2021: Study Quality: Good but no subcostal images. Normal LV systolic function with visual EF 60-65%. Left ventricle cavity is normal in size. Normal left ventricular wall thickness. Normal global wall motion. Normal diastolic filling pattern, normal LAP. Mild tricuspid regurgitation. No evidence of pulmonary hypertension. RVSP measures 33 mmHg. Mild pulmonic regurgitation. Compared to 09/05/2015 otherwise no significant change  Past Medical History:  Diagnosis Date   Allergic rhinitis    Anticoagulant long-term use    xarelto--- managed by dr Jacinto Halim   Arthritis    Asthma    baldder cancer    Benign localized prostatic hyperplasia with lower urinary tract symptoms (LUTS)    last uti march 2022   Bladder tumor    BPH (benign prostatic hypertrophy)    COVID 05/2019   mild symptoms x 2 days all sx resolved   DDD (degenerative disc disease), cervical    DDD (degenerative disc disease), lumbar  Edema of right lower extremity    wears ted hose   Factor V deficiency (HCC)    05-26-2019 per pt has never had a dvt or pe but has had severeal episodes of superficial venous thrombosis, none recently   Factor V Leiden (HCC)    hx of not on Xarleto in 2 years as of 11/2022   Factor V Leiden (HCC)    hx of , not on xarelto in 2 years per pt   Family history of atrial fibrillation    His brother, sister have A. fib,  suspects his father also had A. fib   Family history of factor V deficiency    Frequency of urination    GERD (gastroesophageal reflux disease)    Hematuria    Hernia of abdominal wall 07/17/2020   in stomach asymptomatic per pt   History of chronic bronchitis    Hypertension    followed by pcp  and dr Jacinto Halim   Celine Ahr 09-09-2015 low risk with normal perfusion and ef 60%;  ETT 08-31-2017 normal with no evidence ischemia : both in epic)   Nocturia    PAF (paroxysmal atrial fibrillation) [facility])    cardiologist----  dr Jacinto Halim-- dx 2017   Primary hypertension 02/09/2022   RBBB (right bundle branch block)    Varicose veins of right lower extremity with pain    Venous insufficiency of both lower extremities    vascular--- dr fields--  right > left ;  s/p  ablation and phlebectomies   Wears glasses     Past Surgical History:  Procedure Laterality Date   CATARACT EXTRACTION W/ INTRAOCULAR LENS  IMPLANT, BILATERAL Bilateral 2014   COLONOSCOPY N/A 06/27/2014   Procedure: COLONOSCOPY;  Surgeon: Charolett Bumpers, MD;  Location: [facility];  Service: Endoscopy;  Laterality: N/A;   CYSTOSCOPY WITH FULGERATION N/A 06/01/2019   Procedure: CYSTOSCOPY WITH BLADDER BIOPSY/ FULGERATION;  Surgeon: Rene Paci, MD;  Location: [facility];  Service: Urology;  Laterality: N/A;   ENDOVENOUS ABLATION SAPHENOUS VEIN W/ LASER Right 06/30/2016   EVLA R GSV by Josephina Gip MD    ENDOVENOUS ABLATION SAPHENOUS VEIN W/ LASER Left 07/14/2016   endovenous laser ablation Left greater saphenous vein by Josephina Gip MD    REPAIR CONGENITAL URETHRAL DEFECT  INFANT   SKIN BIOPSY  07/17/2020   right knee biopsy of severe atypsia x 3 bx surgery planned for 08-2020   stab phlebectomy Left 11/24/2016   stab phlebectomy > 20 incisions left leg by Josephina Gip MD    stab phlebectomy  Right `12-29-2016   stab phlebectomy > 20 incisions right leg by Josephina Gip MD    THULIUM LASER TURP  (TRANSURETHRAL RESECTION OF PROSTATE) N/A 07/20/2020   Procedure: Morton Peters LASER TURP (TRANSURETHRAL RESECTION OF PROSTATE), CYSTOSCOPY;  Surgeon: Rene Paci, MD;  Location[facility];  Service: Urology;  Laterality: N/A;  ONLY NEEDS 60 MIN   TRANSURETHRAL RESECTION OF PROSTATE  03/10/2011   Procedure: TRANSURETHRAL RESECTION OF THE PROSTATE WITH GYRUS INSTRUMENTS;  Surgeon: Valetta Fuller, MD;  Location: [facility];  Service: Urology;  Laterality: N/A;  [employer] TURP OWER     MEDICATIONS:  acetaminophen (TYLENOL) 650 MG CR tablet   amLODipine (NORVASC) 5 MG tablet   atorvastatin (LIPITOR) 10 MG tablet   cephALEXin (KEFLEX) 250 MG capsule   Cholecalciferol 125 MCG (5000 UT) capsule   Coenzyme Q10 (COQ10) 100 MG CAPS   flecainide (TAMBOCOR) 100 MG tablet  fluticasone (FLONASE) 50 MCG/ACT nasal spray   levocetirizine (XYZAL) 5 MG tablet   losartan (COZAAR) 100 MG tablet   melatonin 5 MG TABS   metoprolol tartrate (LOPRESSOR) 25 MG tablet   montelukast (SINGULAIR) 10 MG tablet   Multiple Vitamins-Minerals (CENTRUM SILVER PO)   omeprazole (PRILOSEC) 20 MG capsule   SYMBICORT 160-4.5 MCG/ACT inhaler   tadalafil (CIALIS) 5 MG tablet   traMADol (ULTRAM) 50 MG tablet   zolpidem (AMBIEN) 5 MG tablet   No current facility-administered medications for this encounter.   Marcille Blanco [facility] Phone [phone] 12/02/2022 11:52 AM

## 2022-12-04 DIAGNOSIS — R262 Difficulty in walking, not elsewhere classified: Secondary | ICD-10-CM | POA: Diagnosis not present

## 2022-12-04 DIAGNOSIS — M25662 Stiffness of left knee, not elsewhere classified: Secondary | ICD-10-CM | POA: Diagnosis not present

## 2022-12-04 DIAGNOSIS — M1732 Unilateral post-traumatic osteoarthritis, left knee: Secondary | ICD-10-CM | POA: Diagnosis not present

## 2022-12-05 DIAGNOSIS — N401 Enlarged prostate with lower urinary tract symptoms: Secondary | ICD-10-CM | POA: Diagnosis not present

## 2022-12-05 DIAGNOSIS — I10 Essential (primary) hypertension: Secondary | ICD-10-CM | POA: Diagnosis not present

## 2022-12-05 DIAGNOSIS — M1710 Unilateral primary osteoarthritis, unspecified knee: Secondary | ICD-10-CM | POA: Diagnosis not present

## 2022-12-05 DIAGNOSIS — E7800 Pure hypercholesterolemia, unspecified: Secondary | ICD-10-CM | POA: Diagnosis not present

## 2022-12-05 DIAGNOSIS — I4891 Unspecified atrial fibrillation: Secondary | ICD-10-CM | POA: Diagnosis not present

## 2022-12-05 DIAGNOSIS — J42 Unspecified chronic bronchitis: Secondary | ICD-10-CM | POA: Diagnosis not present

## 2022-12-05 DIAGNOSIS — J309 Allergic rhinitis, unspecified: Secondary | ICD-10-CM | POA: Diagnosis not present

## 2022-12-08 MED ORDER — TRANEXAMIC ACID 1000 MG/10ML IV SOLN
2000.0000 mg | INTRAVENOUS | Status: AC
  Administered 2022-12-09: 1000 mg via TOPICAL
  Filled 2022-12-08: qty 20

## 2022-12-08 NOTE — H&P (Signed)
TOTAL KNEE ADMISSION H&P  Patient is being admitted for left total knee arthroplasty.  Subjective:  Chief Complaint:left knee pain.  HPI: Christian [name], 77 y.o. male, has a history of pain and functional disability in the left knee due to arthritis and has failed non-surgical conservative treatments for greater than 12 weeks to includeNSAID's and/or analgesics, corticosteriod injections, viscosupplementation injections, flexibility and strengthening excercises, supervised PT with diminished ADL's post treatment, use of assistive devices, weight reduction as appropriate, and activity modification.  Onset of symptoms was gradual, starting 5 years ago with gradually worsening course since that time. The patient noted no past surgery on the left knee(s).  Patient currently rates pain in the left knee(s) at 10 out of 10 with activity. Patient has night pain, worsening of pain with activity and weight bearing, pain that interferes with activities of daily living, crepitus, and joint swelling.  Patient has evidence of subchondral cysts, subchondral sclerosis, periarticular osteophytes, and joint space narrowing by imaging studies. There is no active infection.  Patient Active Problem List   Diagnosis Date Noted   Primary hypertension 02/09/2022   Varicose veins of bilateral lower extremities with other complications 03/03/2016   Atrial fibrillation (HCC) 08/27/2015   BPH (benign prostatic hyperplasia) 03/10/2011   Past Medical History:  Diagnosis Date   Allergic rhinitis    Arthritis    Asthma    baldder cancer    Benign localized prostatic hyperplasia with lower urinary tract symptoms (LUTS)    last uti march 2022   Bladder tumor    BPH (benign prostatic hypertrophy)    COVID 05/2019   mild symptoms x 2 days all sx resolved   DDD (degenerative disc disease), cervical    DDD (degenerative disc disease), lumbar    Edema of right lower extremity    wears ted hose   Factor V Leiden (HCC)     hx of not on Xarleto in 2 years as of 11/2022   Family history of atrial fibrillation    His brother, sister have A. fib, suspects his father also had A. fib   Family history of factor V deficiency    Frequency of urination    GERD (gastroesophageal reflux disease)    Hematuria    Hernia of abdominal wall 07/17/2020   in stomach asymptomatic per pt   History of chronic bronchitis    Hypertension    followed by pcp  and dr Jacinto Halim   Celine Ahr 09-09-2015 low risk with normal perfusion and ef 60%;  ETT 08-31-2017 normal with no evidence ischemia : both in epic)   Nocturia    PAF (paroxysmal atrial fibrillation) [facility])    cardiologist----  dr Jacinto Halim-- dx 2017   RBBB (right bundle branch block)    Varicose veins of right lower extremity with pain    Venous insufficiency of both lower extremities    vascular--- dr fields--  right > left ;  s/p  ablation and phlebectomies   Wears glasses     Past Surgical History:  Procedure Laterality Date   CATARACT EXTRACTION W/ INTRAOCULAR LENS  IMPLANT, BILATERAL Bilateral 2014   COLONOSCOPY N/A 06/27/2014   Procedure: COLONOSCOPY;  Surgeon: Charolett Bumpers, MD;  Location: [facility];  Service: Endoscopy;  Laterality: N/A;   CYSTOSCOPY WITH FULGERATION N/A 06/01/2019   Procedure: CYSTOSCOPY WITH BLADDER BIOPSY/ FULGERATION;  Surgeon: Rene Paci, MD;  Location: [facility];  Service: Urology;  Laterality: N/A;   ENDOVENOUS ABLATION SAPHENOUS VEIN W/ LASER  Right 06/30/2016   EVLA R GSV by Josephina Gip MD    ENDOVENOUS ABLATION SAPHENOUS VEIN W/ LASER Left 07/14/2016   endovenous laser ablation Left greater saphenous vein by Josephina Gip MD    REPAIR CONGENITAL URETHRAL DEFECT  INFANT   SKIN BIOPSY  07/17/2020   right knee biopsy of severe atypsia x 3 bx surgery planned for 08-2020   stab phlebectomy Left 11/24/2016   stab phlebectomy > 20 incisions left leg by Josephina Gip MD    stab phlebectomy  Right `12-29-2016   stab  phlebectomy > 20 incisions right leg by Josephina Gip MD    THULIUM LASER TURP (TRANSURETHRAL RESECTION OF PROSTATE) N/A 07/20/2020   Procedure: Morton Peters LASER TURP (TRANSURETHRAL RESECTION OF PROSTATE), CYSTOSCOPY;  Surgeon: Rene Paci, MD;  Location: [facility];  Service: Urology;  Laterality: N/A;  ONLY NEEDS 60 MIN   TRANSURETHRAL RESECTION OF PROSTATE  03/10/2011   Procedure: TRANSURETHRAL RESECTION OF THE PROSTATE WITH GYRUS INSTRUMENTS;  Surgeon: Valetta Fuller, MD;  Location: [facility];  Service: Urology;  Laterality: N/A;  [employer] TURP OWER     Current Facility-Administered Medications  Medication Dose Route Frequency Provider Last Rate Last Admin   [START ON 12/09/2022] tranexamic acid (CYKLOKAPRON) 2,000 mg in sodium chloride 0.9 % 50 mL Topical Application  2,000 mg Topical On Call to OR Marcene Corning, MD       Current Outpatient Medications  Medication Sig Dispense Refill Last Dose   acetaminophen (TYLENOL) 650 MG CR tablet Take 1,300 mg by mouth daily.      amLODipine (NORVASC) 5 MG tablet Take 5 mg by mouth at bedtime.      atorvastatin (LIPITOR) 10 MG tablet TAKE 1 TABLET BY MOUTH EVERY DAY 90 tablet 2    cephALEXin (KEFLEX) 250 MG capsule Take 250 mg by mouth every morning.      Cholecalciferol 125 MCG (5000 UT) capsule Take 5,000 Units by mouth at bedtime.       Coenzyme Q10 (COQ10) 100 MG CAPS Take 100 mg by mouth at bedtime.      flecainide (TAMBOCOR) 100 MG tablet TAKE ONE TABLET BY MOUTH AT BREAKFAST AND AT BEDTIME 180 tablet 3    fluticasone (FLONASE) 50 MCG/ACT nasal spray Place 1-2 sprays into both nostrils daily.      levocetirizine (XYZAL) 5 MG tablet Take 5 mg by mouth at bedtime.      losartan (COZAAR) 100 MG tablet Take 100 mg by mouth daily.      melatonin 5 MG TABS Take 5 mg by mouth at bedtime.      metoprolol tartrate (LOPRESSOR) 25 MG tablet Take 25 mg by mouth 2 (two) times daily.      montelukast  (SINGULAIR) 10 MG tablet Take 10 mg by mouth at bedtime.  3    Multiple Vitamins-Minerals (CENTRUM SILVER PO) Take 1 tablet by mouth at bedtime.       omeprazole (PRILOSEC) 20 MG capsule Take 20 mg by mouth every other day.      SYMBICORT 160-4.5 MCG/ACT inhaler Inhale 2 puffs into the lungs in the morning and at bedtime.       tadalafil (CIALIS) 5 MG tablet Take 5 mg by mouth daily.      traMADol (ULTRAM) 50 MG tablet Take 50 mg by mouth every 6 (six) hours as needed for moderate pain (pain score 4-6).      zolpidem (AMBIEN) 5 MG tablet Take 5 mg by  mouth at bedtime as needed for sleep.       Allergies  Allergen Reactions   Gin [Alcohol] Other (See Comments)    "Pins and needles feeling and blotches on my neck"   Other     Bcg caused blood clot and flu like symptoms   Adhesive [Tape] Other (See Comments)    Redness/ irritation   Lobster [Shellfish Allergy] Hives    And per pt positive Allergy test    Social History   Tobacco Use   Smoking status: Former    Current packs/day: 0.00    Average packs/day: 1 pack/day for 20.0 years (20.0 ttl pk-yrs)    Types: Cigarettes    Start date: 02/28/1975    Quit date: 02/28/1995    Years since quitting: 27.7   Smokeless tobacco: Never  Substance Use Topics   Alcohol use: Yes    Alcohol/week: 14.0 standard drinks of alcohol    Types: 14 Shots of liquor per week    Comment: 3 drinks alcohol one since last use on 11/25/22    Family History  Problem Relation Age of Onset   Atrial fibrillation Father    Atrial fibrillation Sister    Factor V Leiden deficiency Sister    Atrial fibrillation Brother    Factor V Leiden deficiency Brother    Hyperlipidemia Mother      Review of Systems  Musculoskeletal:  Positive for arthralgias.       Left knee  All other systems reviewed and are negative.   Objective:  Physical Exam Constitutional:      Appearance: Normal appearance.  HENT:     Head: Normocephalic and atraumatic.     Nose: Nose  normal.     Mouth/Throat:     Pharynx: Oropharynx is clear.  Eyes:     Extraocular Movements: Extraocular movements intact.  Pulmonary:     Effort: Pulmonary effort is normal.  Abdominal:     Palpations: Abdomen is soft.  Musculoskeletal:     Cervical back: Normal range of motion.     Comments: Both knees move about 0-130.  I do not feel an effusion on either side.  On the left he has some pain through the medial patellar facet and medial joint line.  The same is true on the right.  Ligaments are stable.  Hip motion is full and straight leg raise is negative.    Skin:    General: Skin is warm and dry.  Neurological:     General: No focal deficit present.     Mental Status: He is alert and oriented to person, place, and time. Mental status is at baseline.  Psychiatric:        Mood and Affect: Mood normal.        Behavior: Behavior normal.        Thought Content: Thought content normal.        Judgment: Judgment normal.     Vital signs in last 24 hours:    Labs:   Estimated body mass index is 31.85 kg/m as calculated from the following:   Height as of 12/01/22: 5\' 10"  (1.778 m).   Weight as of 12/01/22: 100.7 kg.   Imaging Review Plain radiographs demonstrate severe degenerative joint disease of the left knee(s). The overall alignment isneutral. The bone quality appears to be good for age and reported activity level.      Assessment/Plan:  End stage primary arthritis, left knee   The patient history, physical examination, clinical  judgment of the provider and imaging studies are consistent with end stage degenerative joint disease of the left knee(s) and total knee arthroplasty is deemed medically necessary. The treatment options including medical management, injection therapy arthroscopy and arthroplasty were discussed at length. The risks and benefits of total knee arthroplasty were presented and reviewed. The risks due to aseptic loosening, infection, stiffness,  patella tracking problems, thromboembolic complications and other imponderables were discussed. The patient acknowledged the explanation, agreed to proceed with the plan and consent was signed. Patient is being admitted for inpatient treatment for surgery, pain control, PT, OT, prophylactic antibiotics, VTE prophylaxis, progressive ambulation and ADL's and discharge planning. The patient is planning to be discharged home with home health services   Patient's anticipated LOS is less than 2 midnights, meeting these requirements: - Younger than 29 - Lives within 1 hour of care - Has a competent adult at home to recover with post-op recover - NO history of  - Chronic pain requiring opiods  - Diabetes  - Coronary Artery Disease  - Heart failure  - Heart attack  - Stroke  - DVT/VTE  - Cardiac arrhythmia  - Respiratory Failure/COPD  - Renal failure  - Anemia  - Advanced Liver disease

## 2022-12-09 ENCOUNTER — Other Ambulatory Visit: Payer: Self-pay

## 2022-12-09 ENCOUNTER — Encounter (HOSPITAL_COMMUNITY): Payer: Self-pay | Admitting: Orthopaedic Surgery

## 2022-12-09 ENCOUNTER — Ambulatory Visit (HOSPITAL_COMMUNITY): Payer: Medicare HMO | Admitting: Medical

## 2022-12-09 ENCOUNTER — Ambulatory Visit (HOSPITAL_COMMUNITY)
Admission: RE | Admit: 2022-12-09 | Discharge: 2022-12-09 | Disposition: A | Discharge status: Home / Self Care | Payer: Medicare HMO | Source: Ambulatory Visit | Attending: Orthopaedic Surgery | Admitting: Orthopaedic Surgery

## 2022-12-09 ENCOUNTER — Ambulatory Visit (HOSPITAL_BASED_OUTPATIENT_CLINIC_OR_DEPARTMENT_OTHER): Payer: Medicare HMO | Admitting: Anesthesiology

## 2022-12-09 ENCOUNTER — Encounter (HOSPITAL_COMMUNITY)
Admission: RE | Disposition: A | Discharge status: Home / Self Care | Payer: Self-pay | Source: Ambulatory Visit | Attending: Orthopaedic Surgery

## 2022-12-09 DIAGNOSIS — J45909 Unspecified asthma, uncomplicated: Secondary | ICD-10-CM | POA: Insufficient documentation

## 2022-12-09 DIAGNOSIS — M1712 Unilateral primary osteoarthritis, left knee: Secondary | ICD-10-CM | POA: Insufficient documentation

## 2022-12-09 DIAGNOSIS — I10 Essential (primary) hypertension: Secondary | ICD-10-CM | POA: Diagnosis not present

## 2022-12-09 DIAGNOSIS — K219 Gastro-esophageal reflux disease without esophagitis: Secondary | ICD-10-CM | POA: Diagnosis not present

## 2022-12-09 DIAGNOSIS — Z87891 Personal history of nicotine dependence: Secondary | ICD-10-CM | POA: Insufficient documentation

## 2022-12-09 DIAGNOSIS — Z01818 Encounter for other preprocedural examination: Secondary | ICD-10-CM

## 2022-12-09 DIAGNOSIS — Z96651 Presence of right artificial knee joint: Secondary | ICD-10-CM | POA: Diagnosis not present

## 2022-12-09 DIAGNOSIS — G8918 Other acute postprocedural pain: Secondary | ICD-10-CM | POA: Diagnosis not present

## 2022-12-09 HISTORY — PX: TOTAL KNEE ARTHROPLASTY: SHX125

## 2022-12-09 SURGERY — ARTHROPLASTY, KNEE, TOTAL
Anesthesia: Spinal | Site: Knee | Laterality: Left

## 2022-12-09 MED ORDER — METOCLOPRAMIDE HCL 5 MG/ML IJ SOLN: 5.0000 mg | Freq: Three times a day (TID) | INTRAMUSCULAR | Status: DC | PRN

## 2022-12-09 MED ORDER — METOCLOPRAMIDE HCL 5 MG PO TABS: 5.0000 mg | ORAL_TABLET | Freq: Three times a day (TID) | ORAL | Status: DC | PRN

## 2022-12-09 MED ORDER — PHENOL 1.4 % MT LIQD: 1.0000 | OROMUCOSAL | Status: DC | PRN

## 2022-12-09 MED ORDER — ASPIRIN 81 MG PO TBEC: 81.0000 mg | DELAYED_RELEASE_TABLET | Freq: Two times a day (BID) | ORAL | 0 refills | Status: DC

## 2022-12-09 MED ORDER — TRANEXAMIC ACID 1000 MG/10ML IV SOLN
INTRAVENOUS | Status: DC | PRN
  Administered 2022-12-09: 2000 mg via TOPICAL

## 2022-12-09 MED ORDER — MIDAZOLAM HCL 2 MG/2ML IJ SOLN
INTRAMUSCULAR | Status: AC
Start: 2022-12-09 — End: ?
  Filled 2022-12-09: qty 2

## 2022-12-09 MED ORDER — PHENYLEPHRINE 80 MCG/ML (10ML) SYRINGE FOR IV PUSH (FOR BLOOD PRESSURE SUPPORT)
PREFILLED_SYRINGE | INTRAVENOUS | Status: AC
  Filled 2022-12-09: qty 10

## 2022-12-09 MED ORDER — ACETAMINOPHEN 500 MG PO TABS: 500.0000 mg | ORAL_TABLET | Freq: Four times a day (QID) | ORAL | Status: DC

## 2022-12-09 MED ORDER — CEFAZOLIN SODIUM-DEXTROSE 2-4 GM/100ML-% IV SOLN
2.0000 g | INTRAVENOUS | Status: AC
  Administered 2022-12-09: 2 g via INTRAVENOUS
  Filled 2022-12-09: qty 100

## 2022-12-09 MED ORDER — DROPERIDOL 2.5 MG/ML IJ SOLN
0.6250 mg | Freq: Once | INTRAMUSCULAR | Status: DC | PRN
Start: 2022-12-09 — End: 2022-12-09

## 2022-12-09 MED ORDER — DIPHENHYDRAMINE HCL 12.5 MG/5ML PO ELIX: 12.5000 mg | ORAL_SOLUTION | ORAL | Status: DC | PRN

## 2022-12-09 MED ORDER — OXYCODONE HCL 5 MG/5ML PO SOLN: 5.0000 mg | Freq: Once | ORAL | Status: DC | PRN

## 2022-12-09 MED ORDER — ACETAMINOPHEN 500 MG PO TABS
1000.0000 mg | ORAL_TABLET | Freq: Once | ORAL | Status: AC
  Administered 2022-12-09: 1000 mg via ORAL
  Filled 2022-12-09: qty 2

## 2022-12-09 MED ORDER — METHOCARBAMOL 500 MG PO TABS: 500.0000 mg | ORAL_TABLET | Freq: Four times a day (QID) | ORAL | Status: DC | PRN

## 2022-12-09 MED ORDER — ORAL CARE MOUTH RINSE: 15.0000 mL | Freq: Once | OROMUCOSAL | Status: AC

## 2022-12-09 MED ORDER — SODIUM CHLORIDE (PF) 0.9 % IJ SOLN
INTRAMUSCULAR | Status: DC | PRN
  Administered 2022-12-09: 30 mL

## 2022-12-09 MED ORDER — TRANEXAMIC ACID-NACL 1000-0.7 MG/100ML-% IV SOLN
1000.0000 mg | Freq: Once | INTRAVENOUS | Status: AC
  Administered 2022-12-09: 1000 mg via INTRAVENOUS

## 2022-12-09 MED ORDER — ONDANSETRON HCL 4 MG/2ML IJ SOLN
INTRAMUSCULAR | Status: DC | PRN
  Administered 2022-12-09: 4 mg via INTRAVENOUS

## 2022-12-09 MED ORDER — ACETAMINOPHEN 10 MG/ML IV SOLN
INTRAVENOUS | Status: AC
  Filled 2022-12-09: qty 100

## 2022-12-09 MED ORDER — TRANEXAMIC ACID-NACL 1000-0.7 MG/100ML-% IV SOLN
1000.0000 mg | INTRAVENOUS | Status: DC
  Filled 2022-12-09: qty 100

## 2022-12-09 MED ORDER — PHENYLEPHRINE HCL-NACL 20-0.9 MG/250ML-% IV SOLN
INTRAVENOUS | Status: AC
  Filled 2022-12-09: qty 250

## 2022-12-09 MED ORDER — PROPOFOL 10 MG/ML IV BOLUS
INTRAVENOUS | Status: AC
  Filled 2022-12-09: qty 20

## 2022-12-09 MED ORDER — HYDROCODONE-ACETAMINOPHEN 5-325 MG PO TABS: 1.0000 | ORAL_TABLET | Freq: Four times a day (QID) | ORAL | 0 refills | Status: DC | PRN

## 2022-12-09 MED ORDER — SODIUM CHLORIDE (PF) 0.9 % IJ SOLN
INTRAMUSCULAR | Status: AC
  Filled 2022-12-09: qty 30

## 2022-12-09 MED ORDER — DEXAMETHASONE SODIUM PHOSPHATE 10 MG/ML IJ SOLN
INTRAMUSCULAR | Status: DC | PRN
  Administered 2022-12-09: 10 mg via INTRAVENOUS

## 2022-12-09 MED ORDER — PHENYLEPHRINE HCL (PRESSORS) 10 MG/ML IV SOLN
INTRAVENOUS | Status: DC | PRN
  Administered 2022-12-09: 80 ug via INTRAVENOUS

## 2022-12-09 MED ORDER — SODIUM CHLORIDE 0.9 % IR SOLN
Status: DC | PRN
  Administered 2022-12-09: 1000 mL

## 2022-12-09 MED ORDER — BUPIVACAINE-EPINEPHRINE (PF) 0.5% -1:200000 IJ SOLN
INTRAMUSCULAR | Status: AC
  Filled 2022-12-09: qty 30

## 2022-12-09 MED ORDER — 0.9 % SODIUM CHLORIDE (POUR BTL) OPTIME
TOPICAL | Status: DC | PRN
  Administered 2022-12-09: 1000 mL

## 2022-12-09 MED ORDER — LIDOCAINE HCL (CARDIAC) PF 100 MG/5ML IV SOSY
PREFILLED_SYRINGE | INTRAVENOUS | Status: DC | PRN
  Administered 2022-12-09: 20 mg via INTRAVENOUS

## 2022-12-09 MED ORDER — BUPIVACAINE LIPOSOME 1.3 % IJ SUSP
INTRAMUSCULAR | Status: AC
  Filled 2022-12-09: qty 20

## 2022-12-09 MED ORDER — BUPIVACAINE LIPOSOME 1.3 % IJ SUSP
INTRAMUSCULAR | Status: DC | PRN
  Administered 2022-12-09: 20 mL

## 2022-12-09 MED ORDER — TIZANIDINE HCL 4 MG PO TABS: 4.0000 mg | ORAL_TABLET | Freq: Four times a day (QID) | ORAL | 1 refills | Status: DC | PRN

## 2022-12-09 MED ORDER — POVIDONE-IODINE 10 % EX SWAB: 2.0000 | Freq: Once | CUTANEOUS | Status: DC

## 2022-12-09 MED ORDER — PROPOFOL 1000 MG/100ML IV EMUL
INTRAVENOUS | Status: AC
  Filled 2022-12-09: qty 100

## 2022-12-09 MED ORDER — CEFAZOLIN SODIUM-DEXTROSE 2-4 GM/100ML-% IV SOLN
2.0000 g | Freq: Four times a day (QID) | INTRAVENOUS | Status: DC
Start: 2022-12-09 — End: 2022-12-09

## 2022-12-09 MED ORDER — BISACODYL 5 MG PO TBEC: 5.0000 mg | DELAYED_RELEASE_TABLET | Freq: Every day | ORAL | Status: DC | PRN

## 2022-12-09 MED ORDER — LACTATED RINGERS IV BOLUS
500.0000 mL | Freq: Once | INTRAVENOUS | Status: AC
  Administered 2022-12-09: 500 mL via INTRAVENOUS

## 2022-12-09 MED ORDER — FENTANYL CITRATE (PF) 100 MCG/2ML IJ SOLN
INTRAMUSCULAR | Status: DC | PRN
  Administered 2022-12-09 (×2): 50 ug via INTRAVENOUS

## 2022-12-09 MED ORDER — OXYCODONE HCL 5 MG PO TABS
5.0000 mg | ORAL_TABLET | Freq: Once | ORAL | Status: DC | PRN
Start: 2022-12-09 — End: 2022-12-09

## 2022-12-09 MED ORDER — PHENYLEPHRINE HCL-NACL 20-0.9 MG/250ML-% IV SOLN
INTRAVENOUS | Status: DC | PRN
  Administered 2022-12-09: 40 ug/min via INTRAVENOUS

## 2022-12-09 MED ORDER — HYDROCODONE-ACETAMINOPHEN 7.5-325 MG PO TABS: 1.0000 | ORAL_TABLET | ORAL | Status: DC | PRN

## 2022-12-09 MED ORDER — DOCUSATE SODIUM 100 MG PO CAPS
100.0000 mg | ORAL_CAPSULE | Freq: Two times a day (BID) | ORAL | Status: DC
  Filled 2022-12-09: qty 1

## 2022-12-09 MED ORDER — METHOCARBAMOL 1000 MG/10ML IJ SOLN: 500.0000 mg | Freq: Four times a day (QID) | INTRAMUSCULAR | Status: DC | PRN

## 2022-12-09 MED ORDER — STERILE WATER FOR IRRIGATION IR SOLN
Status: DC | PRN
  Administered 2022-12-09: 1000 mL

## 2022-12-09 MED ORDER — LACTATED RINGERS IV SOLN: INTRAVENOUS | Status: DC

## 2022-12-09 MED ORDER — HYDROCODONE-ACETAMINOPHEN 5-325 MG PO TABS
1.0000 | ORAL_TABLET | ORAL | Status: DC | PRN
  Administered 2022-12-09: 1 via ORAL

## 2022-12-09 MED ORDER — FENTANYL CITRATE (PF) 100 MCG/2ML IJ SOLN
INTRAMUSCULAR | Status: AC
  Filled 2022-12-09: qty 2

## 2022-12-09 MED ORDER — KETOROLAC TROMETHAMINE 15 MG/ML IJ SOLN
7.5000 mg | Freq: Four times a day (QID) | INTRAMUSCULAR | Status: DC
Start: 2022-12-09 — End: 2022-12-09

## 2022-12-09 MED ORDER — ONDANSETRON HCL 4 MG PO TABS: 4.0000 mg | ORAL_TABLET | Freq: Four times a day (QID) | ORAL | Status: DC | PRN

## 2022-12-09 MED ORDER — MORPHINE SULFATE (PF) 2 MG/ML IV SOLN: 0.5000 mg | INTRAVENOUS | Status: DC | PRN

## 2022-12-09 MED ORDER — LACTATED RINGERS IV BOLUS: 250.0000 mL | Freq: Once | INTRAVENOUS | Status: DC

## 2022-12-09 MED ORDER — ONDANSETRON HCL 4 MG/2ML IJ SOLN: 4.0000 mg | Freq: Four times a day (QID) | INTRAMUSCULAR | Status: DC | PRN

## 2022-12-09 MED ORDER — ACETAMINOPHEN 325 MG PO TABS: 325.0000 mg | ORAL_TABLET | Freq: Four times a day (QID) | ORAL | Status: DC | PRN

## 2022-12-09 MED ORDER — MENTHOL 3 MG MT LOZG: 1.0000 | LOZENGE | OROMUCOSAL | Status: DC | PRN

## 2022-12-09 MED ORDER — LACTATED RINGERS IV SOLN: INTRAVENOUS | Status: DC | PRN

## 2022-12-09 MED ORDER — BUPIVACAINE-EPINEPHRINE 0.5% -1:200000 IJ SOLN
INTRAMUSCULAR | Status: DC | PRN
  Administered 2022-12-09: 30 mL

## 2022-12-09 MED ORDER — CHLORHEXIDINE GLUCONATE 0.12 % MT SOLN
15.0000 mL | Freq: Once | OROMUCOSAL | Status: AC
  Administered 2022-12-09: 15 mL via OROMUCOSAL

## 2022-12-09 MED ORDER — HYDROCODONE-ACETAMINOPHEN 5-325 MG PO TABS
ORAL_TABLET | ORAL | Status: AC
  Filled 2022-12-09: qty 1

## 2022-12-09 MED ORDER — BUPIVACAINE-EPINEPHRINE (PF) 0.5% -1:200000 IJ SOLN
INTRAMUSCULAR | Status: DC | PRN
  Administered 2022-12-09: 15 mL via PERINEURAL

## 2022-12-09 MED ORDER — FENTANYL CITRATE PF 50 MCG/ML IJ SOSY: 25.0000 ug | PREFILLED_SYRINGE | INTRAMUSCULAR | Status: DC | PRN

## 2022-12-09 MED ORDER — BUPIVACAINE LIPOSOME 1.3 % IJ SUSP: 20.0000 mL | Freq: Once | INTRAMUSCULAR | Status: DC

## 2022-12-09 MED ORDER — TRANEXAMIC ACID-NACL 1000-0.7 MG/100ML-% IV SOLN
INTRAVENOUS | Status: AC
  Filled 2022-12-09: qty 100

## 2022-12-09 MED ORDER — CLONIDINE HCL (ANALGESIA) 100 MCG/ML EP SOLN
EPIDURAL | Status: DC | PRN
  Administered 2022-12-09: 100 ug

## 2022-12-09 MED ORDER — PROPOFOL 500 MG/50ML IV EMUL
INTRAVENOUS | Status: DC | PRN
  Administered 2022-12-09: 50 mg via INTRAVENOUS
  Administered 2022-12-09: 100 ug/kg/min via INTRAVENOUS
  Administered 2022-12-09: 10 mg via INTRAVENOUS
  Administered 2022-12-09 (×2): 20 mg via INTRAVENOUS

## 2022-12-09 MED ORDER — MIDAZOLAM HCL 5 MG/5ML IJ SOLN
INTRAMUSCULAR | Status: DC | PRN
  Administered 2022-12-09: 2 mg via INTRAVENOUS

## 2022-12-09 MED ORDER — ALUM & MAG HYDROXIDE-SIMETH 200-200-20 MG/5ML PO SUSP: 30.0000 mL | ORAL | Status: DC | PRN

## 2022-12-09 SURGICAL SUPPLY — 49 items
ATTUNE MED DOME PAT 38 KNEE (Knees) IMPLANT
ATTUNE PS FEM LT SZ 6 CEM KNEE (Femur) IMPLANT
ATTUNE PSRP INSR SZ6 10 KNEE (Insert) IMPLANT
BAG COUNTER SPONGE SURGICOUNT (BAG) ×1 IMPLANT
BAG DECANTER FOR FLEXI CONT (MISCELLANEOUS) ×1 IMPLANT
BAG ZIPLOCK 12X15 (MISCELLANEOUS) ×1 IMPLANT
BASE TIBIAL ROT PLAT SZ 7 KNEE (Knees) IMPLANT
BLADE SAGITTAL 25.0X1.19X90 (BLADE) ×1 IMPLANT
BLADE SAW SGTL 11.0X1.19X90.0M (BLADE) ×1 IMPLANT
BLADE SURG SZ10 CARB STEEL (BLADE) ×1 IMPLANT
BNDG ELASTIC 6INX 5YD STR LF (GAUZE/BANDAGES/DRESSINGS) ×1 IMPLANT
BOOTIES KNEE HIGH SLOAN (MISCELLANEOUS) ×1 IMPLANT
BOWL SMART MIX CTS (DISPOSABLE) ×1 IMPLANT
CEMENT HV SMART SET (Cement) ×2 IMPLANT
COVER SURGICAL LIGHT HANDLE (MISCELLANEOUS) ×1 IMPLANT
CUFF TRNQT CYL 34X4.125X (TOURNIQUET CUFF) ×1 IMPLANT
DRAPE TOP 10253 STERILE (DRAPES) ×1 IMPLANT
DRAPE U-SHAPE 47X51 STRL (DRAPES) ×1 IMPLANT
DRSG AQUACEL AG ADV 3.5X10 (GAUZE/BANDAGES/DRESSINGS) ×1 IMPLANT
DURAPREP 26ML APPLICATOR (WOUND CARE) ×2 IMPLANT
ELECT REM PT RETURN 15FT ADLT (MISCELLANEOUS) ×1 IMPLANT
GLOVE BIO SURGEON STRL SZ8 (GLOVE) ×2 IMPLANT
GLOVE BIOGEL PI IND STRL 7.0 (GLOVE) ×1 IMPLANT
GLOVE BIOGEL PI IND STRL 8 (GLOVE) ×2 IMPLANT
GLOVE SURG SYN 7.0 (GLOVE) ×1 IMPLANT
GLOVE SURG SYN 7.0 PF PI (GLOVE) ×1 IMPLANT
GOWN SRG XL LVL 4 BRTHBL STRL (GOWNS) ×1 IMPLANT
GOWN STRL REUS W/ TWL XL LVL3 (GOWN DISPOSABLE) ×2 IMPLANT
HOLDER FOLEY CATH W/STRAP (MISCELLANEOUS) IMPLANT
HOOD PEEL AWAY T7 (MISCELLANEOUS) ×3 IMPLANT
KIT TURNOVER KIT A (KITS) IMPLANT
MANIFOLD NEPTUNE II (INSTRUMENTS) ×1 IMPLANT
NS IRRIG 1000ML POUR BTL (IV SOLUTION) ×1 IMPLANT
PACK TOTAL KNEE CUSTOM (KITS) ×1 IMPLANT
PAD ARMBOARD 7.5X6 YLW CONV (MISCELLANEOUS) ×1 IMPLANT
PIN STEINMAN FIXATION KNEE (PIN) IMPLANT
PROTECTOR NERVE ULNAR (MISCELLANEOUS) ×1 IMPLANT
SET HNDPC FAN SPRY TIP SCT (DISPOSABLE) ×1 IMPLANT
SPIKE FLUID TRANSFER (MISCELLANEOUS) ×2 IMPLANT
SUT ETHIBOND NAB CT1 #1 30IN (SUTURE) ×1 IMPLANT
SUT VIC AB 0 CT1 36 (SUTURE) ×1 IMPLANT
SUT VIC AB 2-0 CT1 TAPERPNT 27 (SUTURE) ×1 IMPLANT
SUT VICRYL AB 3-0 FS1 BRD 27IN (SUTURE) ×1 IMPLANT
SUT VLOC 180 0 24IN GS25 (SUTURE) ×1 IMPLANT
TIBIAL BASE ROT PLAT SZ 7 KNEE (Knees) ×1 IMPLANT
TRAY FOLEY MTR SLVR 16FR STAT (SET/KITS/TRAYS/PACK) IMPLANT
WATER STERILE IRR 1000ML POUR (IV SOLUTION) ×2 IMPLANT
WRAP KNEE MAXI GEL POST OP (GAUZE/BANDAGES/DRESSINGS) ×1 IMPLANT
YANKAUER SUCT BULB TIP NO VENT (SUCTIONS) ×1 IMPLANT

## 2022-12-09 NOTE — Anesthesia Procedure Notes (Signed)
Procedure Name: MAC Date/Time: 12/09/2022 7:35 AM  Performed by: Lily Lovings, CRNAPre-anesthesia Checklist: Patient identified, Emergency Drugs available, Suction available and Patient being monitored Patient Re-evaluated:Patient Re-evaluated prior to induction Oxygen Delivery Method: Simple face mask Preoxygenation: Pre-oxygenation with 100% oxygen Induction Type: IV induction

## 2022-12-09 NOTE — Transfer of Care (Signed)
Immediate Anesthesia Transfer of Care Note  Patient: Christian [name]  Procedure(s) Performed: LEFT TOTAL KNEE ARTHROPLASTY (Left: Knee)  Patient Location: PACU  Anesthesia Type:Spinal  Level of Consciousness: drowsy and patient cooperative  Airway & Oxygen Therapy: Patient Spontanous Breathing and Patient connected to face mask oxygen  Post-op Assessment: Report given to RN and Patient moving all extremities  Post vital signs: Reviewed and stable  Last Vitals:  Vitals Value Taken Time  BP 104/61 12/09/22 0936  Temp    Pulse 51 12/09/22 0937  Resp 12 12/09/22 0937  SpO2 100 % 12/09/22 0937  Vitals shown include unfiled device data.  Last Pain:  Vitals:   12/09/22 0936  TempSrc:   PainSc: 0-No pain      Patients Stated Pain Goal: 6 (12/09/22 2952)  Complications: No notable events documented.

## 2022-12-09 NOTE — Op Note (Signed)
PREOP DIAGNOSIS: DJD LEFT KNEE POSTOP DIAGNOSIS:  same PROCEDURE: LEFT TKR ANESTHESIA: Spinal and MAC ATTENDING SURGEON: Velna Ochs ASSISTANT: Elodia Florence PA  INDICATIONS FOR PROCEDURE: Christian [name] is a 77 y.o. male who has struggled for a long time with pain due to degenerative arthritis of the left knee.  The patient has failed many conservative non-operative measures and at this point has pain which limits the ability to sleep and walk.  The patient is offered total knee replacement.  Informed operative consent was obtained after discussion of possible risks of anesthesia, infection, neurovascular injury, DVT, and death.  The importance of the post-operative rehabilitation protocol to optimize result was stressed extensively with the patient.  SUMMARY OF FINDINGS AND PROCEDURE:  Christian [name] was taken to the operative suite where under the above anesthesia a left knee replacement was performed.  There were advanced degenerative changes and the bone quality was excellent.  We used the DePuyAttune system and placed size 7 femur, 6 tibia, 38 mm all polyethylene patella, and a size 10 mm spacer.  Elodia Florence PA-C assisted throughout and was invaluable to the completion of the case in that he helped retract and maintain exposure while I placed the components.  He also helped close thereby minimizing OR time.  The patient was admitted for appropriate post-op care to include perioperative antibiotics and mechanical and pharmacologic measures for DVT prophylaxis.  DESCRIPTION OF PROCEDURE:  Christian [name] was taken to the operative suite where the above anesthesia was applied.  The patient was positioned supine and prepped and draped in normal sterile fashion.  An appropriate time out was performed.  After the administration of kefzol pre-op antibiotic the leg was elevated and exsanguinated and a tourniquet inflated.  A standard longitudinal incision was made on the anterior knee.  Dissection was  carried down to the extensor mechanism.  All appropriate anti-infective measures were used including the pre-operative antibiotic, betadine impregnated drape, and closed hooded exhaust systems for each member of the surgical team.  A medial parapatellar incision was made in the extensor mechanism and the knee cap flipped and the knee flexed.  Some residual meniscal tissues were removed along with any remaining ACL/PCL tissue.  A guide was placed on the tibia and a flat cut was made on it's superior surface.  An intramedullary guide was placed in the femur and was utilized to make anterior and posterior cuts creating an appropriate flexion gap.  A second intramedullary guide was placed in the femur to make a distal cut properly balancing the knee with an extension gap equal to the flexion gap.  The three bones sized to the above mentioned sizes and the appropriate guides were placed and utilized.  A trial reduction was done and the knee easily came to full extension and the patella tracked well on flexion.  The trial components were removed and all bones were cleaned with pulsatile lavage and then dried thoroughly.  Cement was mixed and was pressurized onto the bones followed by placement of the aforementioned components.  Excess cement was trimmed and pressure was held on the components until the cement had hardened.  The tourniquet was deflated and a small amount of bleeding was controlled with cautery and pressure.  The knee was irrigated thoroughly.  The extensor mechanism was re-approximated with #1 ethibond in interrupted fashion.  The knee was flexed and the repair was solid.  The subcutaneous tissues were re-approximated with #0 and #2-0 vicryl and the skin  closed with a subcuticular stitch and steristrips.  A sterile dressing was applied.  Intraoperative fluids, EBL, and tourniquet time can be obtained from anesthesia records.  DISPOSITION:  The patient was taken to recovery room in stable condition and  scheduled to potentially go home same day depending on ability to walk and tolerate liquids.  Velna Ochs 12/09/2022, 9:11 AM

## 2022-12-09 NOTE — Anesthesia Procedure Notes (Signed)
Anesthesia Regional Block: Adductor canal block   Pre-Anesthetic Checklist: , timeout performed,  Correct Patient, Correct Site, Correct Laterality,  Correct Procedure, Correct Position, site marked,  Risks and benefits discussed,  Pre-op evaluation,  At surgeon's request and post-op pain management  Laterality: Left  Prep: Maximum Sterile Barrier Precautions used, chloraprep       Needles:  Injection technique: Single-shot  Needle Type: Echogenic Stimulator Needle     Needle Length: 9cm  Needle Gauge: 22     Additional Needles:   Procedures:,,,, ultrasound used (permanent image in chart),,    Narrative:  Start time: 12/09/2022 7:10 AM End time: 12/09/2022 7:13 AM Injection made incrementally with aspirations every 5 mL.  Performed by: Personally  Anesthesiologist: Kaylyn Layer, MD  Additional Notes: Risks, benefits, and alternative discussed. Patient gave consent for procedure. Patient prepped and draped in sterile fashion. Sedation administered, patient remains easily responsive to voice. Relevant anatomy identified with ultrasound guidance. Local anesthetic given in 5cc increments with no signs or symptoms of intravascular injection. No pain or paraesthesias with injection. Patient monitored throughout procedure with signs of LAST or immediate complications. Tolerated well. Ultrasound image placed in chart.  Christian Greenhouse, MD

## 2022-12-09 NOTE — Anesthesia Postprocedure Evaluation (Signed)
Anesthesia Post Note  Patient: Christian [name]  Procedure(s) Performed: LEFT TOTAL KNEE ARTHROPLASTY (Left: Knee)     Patient location during evaluation: PACU Anesthesia Type: Spinal Level of consciousness: awake and alert Pain management: pain level controlled Vital Signs Assessment: post-procedure vital signs reviewed and stable Respiratory status: spontaneous breathing, nonlabored ventilation and respiratory function stable Cardiovascular status: blood pressure returned to baseline Postop Assessment: no apparent nausea or vomiting and spinal receding Anesthetic complications: no   No notable events documented.  Last Vitals:  Vitals:   12/09/22 1030 12/09/22 1045  BP: 113/76 113/70  Pulse: (!) 46 (!) 46  Resp: 13 11  Temp: (!) 35.8 C   SpO2: 100% 100%    Last Pain:  Vitals:   12/09/22 1045  TempSrc:   PainSc: 0-No pain                 [name]

## 2022-12-09 NOTE — Addendum Note (Signed)
Addendum  created 12/09/22 1119 by Lily Lovings, CRNA   Intraprocedure Meds edited

## 2022-12-09 NOTE — Interval H&P Note (Signed)
History and Physical Interval Note:  12/09/2022 7:26 AM  Christian [name]  has presented today for surgery, with the diagnosis of LEFT KNEE DEGENERATIVE JOINT DISEASE.  The various methods of treatment have been discussed with the patient and family. After consideration of risks, benefits and other options for treatment, the patient has consented to  Procedure(s): LEFT TOTAL KNEE ARTHROPLASTY (Left) as a surgical intervention.  The patient's history has been reviewed, patient examined, no change in status, stable for surgery.  I have reviewed the patient's chart and labs.  Questions were answered to the patient's satisfaction.     Velna Ochs

## 2022-12-09 NOTE — Progress Notes (Signed)
Orthopedic Tech Progress Note Patient Details:  Christian [name] [birth year] [id number]  Ortho Devices Type of Ortho Device: Bone foam zero knee Ortho Device/Splint Location: left Ortho Device/Splint Interventions: Ordered, Application, Adjustment   Post Interventions Patient Tolerated: Well Instructions Provided: Adjustment of device, Care of device  Kizzie Fantasia 12/09/2022, 2:11 PM

## 2022-12-09 NOTE — Evaluation (Signed)
Physical Therapy Evaluation Patient Details Name: Christian [name] MRN: [id number] DOB: 07-30-1945 Today's Date: 12/09/2022  History of Present Illness  77 yo male presents to therapy s/p L TKA on 12/09/2022 due to failure of conservative measures. Pt PMH includes but is not limited to: Htn, A-fib, bladder ca, DDD, R BBB, and  varicose veins.  Clinical Impression   Christian [name] is a 77 y.o. male POD 0 s/p L TKA. Patient reports IND with mobility at baseline. Patient is now limited by functional impairments (see PT problem list below) and requires CGA and cues for transfers and gait with RW. Patient was able to ambulate 50 feet with RW and CGA and cues for safe walker management. Patient educated on safe sequencing for functional mobility and fall risk prevention, use of RW and iceman machine, pain goal and management and car transfers pt  and spouse verbalized understanding of safe guarding position for people assisting with mobility. Patient instructed in exercises to facilitate ROM and circulation reviewed and HO provided. Patient will benefit from continued skilled PT interventions to address impairments and progress towards PLOF. Patient has met mobility goals at adequate level for discharge home with family support and therapy services at [facility]; will continue to follow if pt continues acute stay to progress towards Mod I goals.       If plan is discharge home, recommend the following: A little help with walking and/or transfers;A little help with bathing/dressing/bathroom;Assistance with cooking/housework;Assist for transportation;Help with stairs or ramp for entrance   Can travel by private vehicle        Equipment Recommendations None recommended by PT  Recommendations for Other Services       Functional Status Assessment Patient has had a recent decline in their functional status and demonstrates the ability to make significant improvements in function in a reasonable and  predictable amount of time.     Precautions / Restrictions Precautions Precautions: Knee;Fall Restrictions Weight Bearing Restrictions: No      Mobility  Bed Mobility Overal bed mobility: Needs Assistance Bed Mobility: Supine to Sit     Supine to sit: Contact guard, HOB elevated     General bed mobility comments: min cues    Transfers Overall transfer level: Needs assistance Equipment used: Rolling walker (2 wheels) Transfers: Sit to/from Stand Sit to Stand: Contact guard assist           General transfer comment: min cues for safety and technique    Ambulation/Gait Ambulation/Gait assistance: Contact guard assist Gait Distance (Feet): 50 Feet Assistive device: Rolling walker (2 wheels) Gait Pattern/deviations: Step-to pattern, Trunk flexed, Antalgic Gait velocity: decreased     General Gait Details: min ceus for sequencing and technique  Stairs            Wheelchair Mobility     Tilt Bed    Modified Rankin (Stroke Patients Only)       Balance Overall balance assessment: Needs assistance, History of Falls Sitting-balance support: Feet supported Sitting balance-Leahy Scale: Good     Standing balance support: Bilateral upper extremity supported, During functional activity, Reliant on assistive device for balance Standing balance-Leahy Scale: Poor                               Pertinent Vitals/Pain Pain Assessment Pain Assessment: 0-10 Pain Score: 2  Pain Location: L knee Pain Descriptors / Indicators: Discomfort, Constant, Grimacing, Dull, Operative site guarding Pain  Intervention(s): Limited activity within patient's tolerance, Monitored during session, Premedicated before session, Repositioned, Ice applied    Home Living Family/patient expects to be discharged to:: Private residence Living Arrangements: Spouse/significant other Available Help at Discharge: Family Type of Home: Independent living facility Home Access:  Level entry;Elevator       Home Layout: One level Home Equipment: Agricultural consultant (2 wheels);BSC/3in1;Grab bars - tub/shower;Cane - single point (ice man machine) Additional Comments: adjustable bed    Prior Function Prior Level of Function : Independent/Modified Independent;Driving;History of Falls (last six months) (1 fall past 6 months playing pickleball)             Mobility Comments: IND no AD for all ADLs, self care tasks, IADLs       Extremity/Trunk Assessment        Lower Extremity Assessment Lower Extremity Assessment: LLE deficits/detail LLE Deficits / Details: ankle DF/PF 5/5: SLR , 10 degree lag LLE Sensation: WNL    Cervical / Trunk Assessment Cervical / Trunk Assessment: Normal  Communication   Communication Communication: No apparent difficulties  Cognition Arousal: Alert Behavior During Therapy: WFL for tasks assessed/performed Overall Cognitive Status: Within Functional Limits for tasks assessed                                          General Comments      Exercises Total Joint Exercises Ankle Circles/Pumps: AROM, Both, 10 reps Quad Sets: AROM, Left, 5 reps Short Arc Quad: AROM, Left, 5 reps Heel Slides: AROM, Left, 5 reps Hip ABduction/ADduction: AROM, Left, 5 reps Straight Leg Raises: AROM, Left, 5 reps Knee Flexion: AROM, Left, 5 reps, Seated   Assessment/Plan    PT Assessment Patient needs continued PT services  PT Problem List Decreased strength;Decreased range of motion;Decreased activity tolerance;Decreased mobility;Decreased balance;Decreased coordination;Pain       PT Treatment Interventions DME instruction;Gait training;Functional mobility training;Therapeutic exercise;Therapeutic activities;Balance training;Neuromuscular re-education;Patient/family education;Modalities    PT Goals (Current goals can be found in the Care Plan section)  Acute Rehab PT Goals Patient Stated Goal: everything, pickle ball and  golf PT Goal Formulation: With patient Time For Goal Achievement: 12/23/22 Potential to Achieve Goals: Good    Frequency 7X/week     Co-evaluation               AM-PAC PT "6 Clicks" Mobility  Outcome Measure Help needed turning from your back to your side while in a flat bed without using bedrails?: A Little Help needed moving from lying on your back to sitting on the side of a flat bed without using bedrails?: A Little Help needed moving to and from a bed to a chair (including a wheelchair)?: A Little Help needed standing up from a chair using your arms (e.g., wheelchair or bedside chair)?: A Little Help needed to walk in hospital room?: A Little Help needed climbing 3-5 steps with a railing? : A Lot 6 Click Score: 17    End of Session Equipment Utilized During Treatment: Gait belt Activity Tolerance: Patient tolerated treatment well Patient left: in chair;with call bell/phone within reach;with family/visitor present Nurse Communication: Mobility status;Other (comment) (pt readiness for d/c from PT perspective) PT Visit Diagnosis: Unsteadiness on feet (R26.81);Other abnormalities of gait and mobility (R26.89);Muscle weakness (generalized) (M62.81);Difficulty in walking, not elsewhere classified (R26.2);Pain Pain - Right/Left: Left Pain - part of body: Knee;Leg    Time: 1093-2355 PT Time  Calculation (min) (ACUTE ONLY): 48 min   Charges:   PT Evaluation $PT Eval Low Complexity: 1 Low PT Treatments $Gait Training: 8-22 mins $Therapeutic Exercise: 8-22 mins PT General Charges $$ ACUTE PT VISIT: 1 Visit         Johnny Bridge, PT Acute Rehab   Jacqualyn Posey 12/09/2022, 3:39 PM

## 2022-12-09 NOTE — Anesthesia Procedure Notes (Signed)
Spinal  Patient location during procedure: OR Start time: 12/09/2022 7:38 AM End time: 12/09/2022 7:43 AM Reason for block: surgical anesthesia Staffing Performed: anesthesiologist  Anesthesiologist: Kaylyn Layer, MD Performed by: Kaylyn Layer, MD Authorized by: Kaylyn Layer, MD   Preanesthetic Checklist Completed: patient identified, IV checked, risks and benefits discussed, surgical consent, monitors and equipment checked, pre-op evaluation and timeout performed Spinal Block Patient position: sitting Prep: DuraPrep and site prepped and draped Patient monitoring: continuous pulse ox, blood pressure and heart rate Approach: midline Location: L3-4 Injection technique: single-shot Needle Needle type: Pencan  Needle gauge: 24 G Needle length: 9 cm Assessment Events: CSF return Additional Notes Risks, benefits, and alternative discussed. Patient gave consent to procedure. Prepped and draped in sitting position. Patient sedated but responsive to voice. Clear CSF obtained on first attempt but unable to aspirate despite repositioning. Second attempt with clear CSF as well but unable to aspirate, easy injection of medication. No pain or paraesthesias with injection. Patient tolerated procedure well. Vital signs stable. Amalia Greenhouse, MD

## 2022-12-10 ENCOUNTER — Encounter (HOSPITAL_COMMUNITY): Payer: Self-pay | Admitting: Orthopaedic Surgery

## 2022-12-15 DIAGNOSIS — R2681 Unsteadiness on feet: Secondary | ICD-10-CM | POA: Diagnosis not present

## 2022-12-15 DIAGNOSIS — R262 Difficulty in walking, not elsewhere classified: Secondary | ICD-10-CM | POA: Diagnosis not present

## 2022-12-15 DIAGNOSIS — Z96652 Presence of left artificial knee joint: Secondary | ICD-10-CM | POA: Diagnosis not present

## 2022-12-15 DIAGNOSIS — Z471 Aftercare following joint replacement surgery: Secondary | ICD-10-CM | POA: Diagnosis not present

## 2022-12-15 DIAGNOSIS — M6281 Muscle weakness (generalized): Secondary | ICD-10-CM | POA: Diagnosis not present

## 2022-12-17 DIAGNOSIS — M6281 Muscle weakness (generalized): Secondary | ICD-10-CM | POA: Diagnosis not present

## 2022-12-17 DIAGNOSIS — Z471 Aftercare following joint replacement surgery: Secondary | ICD-10-CM | POA: Diagnosis not present

## 2022-12-17 DIAGNOSIS — Z96652 Presence of left artificial knee joint: Secondary | ICD-10-CM | POA: Diagnosis not present

## 2022-12-17 DIAGNOSIS — R262 Difficulty in walking, not elsewhere classified: Secondary | ICD-10-CM | POA: Diagnosis not present

## 2022-12-17 DIAGNOSIS — R2681 Unsteadiness on feet: Secondary | ICD-10-CM | POA: Diagnosis not present

## 2022-12-18 DIAGNOSIS — D3131 Benign neoplasm of right choroid: Secondary | ICD-10-CM | POA: Diagnosis not present

## 2022-12-19 DIAGNOSIS — R2681 Unsteadiness on feet: Secondary | ICD-10-CM | POA: Diagnosis not present

## 2022-12-19 DIAGNOSIS — M6281 Muscle weakness (generalized): Secondary | ICD-10-CM | POA: Diagnosis not present

## 2022-12-19 DIAGNOSIS — M1712 Unilateral primary osteoarthritis, left knee: Secondary | ICD-10-CM | POA: Diagnosis not present

## 2022-12-19 DIAGNOSIS — Z471 Aftercare following joint replacement surgery: Secondary | ICD-10-CM | POA: Diagnosis not present

## 2022-12-19 DIAGNOSIS — Z96652 Presence of left artificial knee joint: Secondary | ICD-10-CM | POA: Diagnosis not present

## 2022-12-19 DIAGNOSIS — R262 Difficulty in walking, not elsewhere classified: Secondary | ICD-10-CM | POA: Diagnosis not present

## 2022-12-22 DIAGNOSIS — M6281 Muscle weakness (generalized): Secondary | ICD-10-CM | POA: Diagnosis not present

## 2022-12-22 DIAGNOSIS — R262 Difficulty in walking, not elsewhere classified: Secondary | ICD-10-CM | POA: Diagnosis not present

## 2022-12-22 DIAGNOSIS — Z96652 Presence of left artificial knee joint: Secondary | ICD-10-CM | POA: Diagnosis not present

## 2022-12-22 DIAGNOSIS — Z471 Aftercare following joint replacement surgery: Secondary | ICD-10-CM | POA: Diagnosis not present

## 2022-12-22 DIAGNOSIS — R2681 Unsteadiness on feet: Secondary | ICD-10-CM | POA: Diagnosis not present

## 2022-12-23 DIAGNOSIS — Z96652 Presence of left artificial knee joint: Secondary | ICD-10-CM | POA: Diagnosis not present

## 2022-12-23 DIAGNOSIS — M6281 Muscle weakness (generalized): Secondary | ICD-10-CM | POA: Diagnosis not present

## 2022-12-23 DIAGNOSIS — Z471 Aftercare following joint replacement surgery: Secondary | ICD-10-CM | POA: Diagnosis not present

## 2022-12-24 DIAGNOSIS — I4891 Unspecified atrial fibrillation: Secondary | ICD-10-CM | POA: Diagnosis not present

## 2022-12-24 DIAGNOSIS — D6851 Activated protein C resistance: Secondary | ICD-10-CM | POA: Diagnosis not present

## 2022-12-24 DIAGNOSIS — M6281 Muscle weakness (generalized): Secondary | ICD-10-CM | POA: Diagnosis not present

## 2022-12-24 DIAGNOSIS — R262 Difficulty in walking, not elsewhere classified: Secondary | ICD-10-CM | POA: Diagnosis not present

## 2022-12-24 DIAGNOSIS — R2681 Unsteadiness on feet: Secondary | ICD-10-CM | POA: Diagnosis not present

## 2022-12-24 DIAGNOSIS — Z96652 Presence of left artificial knee joint: Secondary | ICD-10-CM | POA: Diagnosis not present

## 2022-12-24 DIAGNOSIS — D692 Other nonthrombocytopenic purpura: Secondary | ICD-10-CM | POA: Diagnosis not present

## 2022-12-24 DIAGNOSIS — Z471 Aftercare following joint replacement surgery: Secondary | ICD-10-CM | POA: Diagnosis not present

## 2022-12-26 DIAGNOSIS — Z471 Aftercare following joint replacement surgery: Secondary | ICD-10-CM | POA: Diagnosis not present

## 2022-12-26 DIAGNOSIS — R262 Difficulty in walking, not elsewhere classified: Secondary | ICD-10-CM | POA: Diagnosis not present

## 2022-12-26 DIAGNOSIS — M6281 Muscle weakness (generalized): Secondary | ICD-10-CM | POA: Diagnosis not present

## 2022-12-26 DIAGNOSIS — Z96652 Presence of left artificial knee joint: Secondary | ICD-10-CM | POA: Diagnosis not present

## 2022-12-26 DIAGNOSIS — R2681 Unsteadiness on feet: Secondary | ICD-10-CM | POA: Diagnosis not present

## 2022-12-30 DIAGNOSIS — Z471 Aftercare following joint replacement surgery: Secondary | ICD-10-CM | POA: Diagnosis not present

## 2022-12-30 DIAGNOSIS — R2681 Unsteadiness on feet: Secondary | ICD-10-CM | POA: Diagnosis not present

## 2022-12-30 DIAGNOSIS — Z96652 Presence of left artificial knee joint: Secondary | ICD-10-CM | POA: Diagnosis not present

## 2022-12-30 DIAGNOSIS — M6281 Muscle weakness (generalized): Secondary | ICD-10-CM | POA: Diagnosis not present

## 2022-12-30 DIAGNOSIS — R262 Difficulty in walking, not elsewhere classified: Secondary | ICD-10-CM | POA: Diagnosis not present

## 2022-12-31 DIAGNOSIS — M6281 Muscle weakness (generalized): Secondary | ICD-10-CM | POA: Diagnosis not present

## 2022-12-31 DIAGNOSIS — R2681 Unsteadiness on feet: Secondary | ICD-10-CM | POA: Diagnosis not present

## 2022-12-31 DIAGNOSIS — Z471 Aftercare following joint replacement surgery: Secondary | ICD-10-CM | POA: Diagnosis not present

## 2022-12-31 DIAGNOSIS — Z96652 Presence of left artificial knee joint: Secondary | ICD-10-CM | POA: Diagnosis not present

## 2022-12-31 DIAGNOSIS — R262 Difficulty in walking, not elsewhere classified: Secondary | ICD-10-CM | POA: Diagnosis not present

## 2023-01-02 DIAGNOSIS — M6281 Muscle weakness (generalized): Secondary | ICD-10-CM | POA: Diagnosis not present

## 2023-01-02 DIAGNOSIS — Z471 Aftercare following joint replacement surgery: Secondary | ICD-10-CM | POA: Diagnosis not present

## 2023-01-02 DIAGNOSIS — R262 Difficulty in walking, not elsewhere classified: Secondary | ICD-10-CM | POA: Diagnosis not present

## 2023-01-02 DIAGNOSIS — Z96652 Presence of left artificial knee joint: Secondary | ICD-10-CM | POA: Diagnosis not present

## 2023-01-02 DIAGNOSIS — R2681 Unsteadiness on feet: Secondary | ICD-10-CM | POA: Diagnosis not present

## 2023-01-06 DIAGNOSIS — R2681 Unsteadiness on feet: Secondary | ICD-10-CM | POA: Diagnosis not present

## 2023-01-06 DIAGNOSIS — R262 Difficulty in walking, not elsewhere classified: Secondary | ICD-10-CM | POA: Diagnosis not present

## 2023-01-06 DIAGNOSIS — M6281 Muscle weakness (generalized): Secondary | ICD-10-CM | POA: Diagnosis not present

## 2023-01-06 DIAGNOSIS — Z96652 Presence of left artificial knee joint: Secondary | ICD-10-CM | POA: Diagnosis not present

## 2023-01-06 DIAGNOSIS — Z471 Aftercare following joint replacement surgery: Secondary | ICD-10-CM | POA: Diagnosis not present

## 2023-01-09 DIAGNOSIS — R2681 Unsteadiness on feet: Secondary | ICD-10-CM | POA: Diagnosis not present

## 2023-01-09 DIAGNOSIS — Z96652 Presence of left artificial knee joint: Secondary | ICD-10-CM | POA: Diagnosis not present

## 2023-01-09 DIAGNOSIS — Z471 Aftercare following joint replacement surgery: Secondary | ICD-10-CM | POA: Diagnosis not present

## 2023-01-09 DIAGNOSIS — R262 Difficulty in walking, not elsewhere classified: Secondary | ICD-10-CM | POA: Diagnosis not present

## 2023-01-09 DIAGNOSIS — M6281 Muscle weakness (generalized): Secondary | ICD-10-CM | POA: Diagnosis not present

## 2023-01-27 DIAGNOSIS — Z6831 Body mass index (BMI) 31.0-31.9, adult: Secondary | ICD-10-CM | POA: Diagnosis not present

## 2023-01-27 DIAGNOSIS — M5412 Radiculopathy, cervical region: Secondary | ICD-10-CM | POA: Diagnosis not present

## 2023-01-27 DIAGNOSIS — M542 Cervicalgia: Secondary | ICD-10-CM | POA: Diagnosis not present

## 2023-02-13 DIAGNOSIS — L821 Other seborrheic keratosis: Secondary | ICD-10-CM | POA: Diagnosis not present

## 2023-02-13 DIAGNOSIS — L820 Inflamed seborrheic keratosis: Secondary | ICD-10-CM | POA: Diagnosis not present

## 2023-02-13 DIAGNOSIS — L570 Actinic keratosis: Secondary | ICD-10-CM | POA: Diagnosis not present

## 2023-02-13 DIAGNOSIS — R58 Hemorrhage, not elsewhere classified: Secondary | ICD-10-CM | POA: Diagnosis not present

## 2023-03-03 DIAGNOSIS — M1712 Unilateral primary osteoarthritis, left knee: Secondary | ICD-10-CM | POA: Diagnosis not present

## 2023-03-06 DIAGNOSIS — Z1331 Encounter for screening for depression: Secondary | ICD-10-CM | POA: Diagnosis not present

## 2023-03-06 DIAGNOSIS — Z23 Encounter for immunization: Secondary | ICD-10-CM | POA: Diagnosis not present

## 2023-03-06 DIAGNOSIS — E7800 Pure hypercholesterolemia, unspecified: Secondary | ICD-10-CM | POA: Diagnosis not present

## 2023-03-06 DIAGNOSIS — N401 Enlarged prostate with lower urinary tract symptoms: Secondary | ICD-10-CM | POA: Diagnosis not present

## 2023-03-06 DIAGNOSIS — Z8551 Personal history of malignant neoplasm of bladder: Secondary | ICD-10-CM | POA: Diagnosis not present

## 2023-03-06 DIAGNOSIS — Z0000 Encounter for general adult medical examination without abnormal findings: Secondary | ICD-10-CM | POA: Diagnosis not present

## 2023-03-06 DIAGNOSIS — R7303 Prediabetes: Secondary | ICD-10-CM | POA: Diagnosis not present

## 2023-03-06 DIAGNOSIS — E871 Hypo-osmolality and hyponatremia: Secondary | ICD-10-CM | POA: Diagnosis not present

## 2023-03-06 DIAGNOSIS — I4891 Unspecified atrial fibrillation: Secondary | ICD-10-CM | POA: Diagnosis not present

## 2023-03-06 DIAGNOSIS — I10 Essential (primary) hypertension: Secondary | ICD-10-CM | POA: Diagnosis not present

## 2023-03-06 DIAGNOSIS — M545 Low back pain, unspecified: Secondary | ICD-10-CM | POA: Diagnosis not present

## 2023-03-06 DIAGNOSIS — E6609 Other obesity due to excess calories: Secondary | ICD-10-CM | POA: Diagnosis not present

## 2023-03-06 DIAGNOSIS — D6869 Other thrombophilia: Secondary | ICD-10-CM | POA: Diagnosis not present

## 2023-03-06 DIAGNOSIS — G8929 Other chronic pain: Secondary | ICD-10-CM | POA: Diagnosis not present

## 2023-03-20 DIAGNOSIS — R197 Diarrhea, unspecified: Secondary | ICD-10-CM | POA: Diagnosis not present

## 2023-03-24 ENCOUNTER — Encounter: Payer: Self-pay | Admitting: Podiatry

## 2023-03-24 ENCOUNTER — Ambulatory Visit: Admitting: Podiatry

## 2023-03-24 DIAGNOSIS — E871 Hypo-osmolality and hyponatremia: Secondary | ICD-10-CM | POA: Insufficient documentation

## 2023-03-24 DIAGNOSIS — G479 Sleep disorder, unspecified: Secondary | ICD-10-CM | POA: Insufficient documentation

## 2023-03-24 DIAGNOSIS — J42 Unspecified chronic bronchitis: Secondary | ICD-10-CM | POA: Insufficient documentation

## 2023-03-24 DIAGNOSIS — E559 Vitamin D deficiency, unspecified: Secondary | ICD-10-CM | POA: Insufficient documentation

## 2023-03-24 DIAGNOSIS — Z8551 Personal history of malignant neoplasm of bladder: Secondary | ICD-10-CM | POA: Insufficient documentation

## 2023-03-24 DIAGNOSIS — G4700 Insomnia, unspecified: Secondary | ICD-10-CM | POA: Insufficient documentation

## 2023-03-24 DIAGNOSIS — L608 Other nail disorders: Secondary | ICD-10-CM | POA: Diagnosis not present

## 2023-03-24 DIAGNOSIS — R7303 Prediabetes: Secondary | ICD-10-CM | POA: Insufficient documentation

## 2023-03-24 DIAGNOSIS — J309 Allergic rhinitis, unspecified: Secondary | ICD-10-CM | POA: Insufficient documentation

## 2023-03-24 DIAGNOSIS — L603 Nail dystrophy: Secondary | ICD-10-CM

## 2023-03-24 DIAGNOSIS — D6869 Other thrombophilia: Secondary | ICD-10-CM | POA: Insufficient documentation

## 2023-03-24 DIAGNOSIS — M47812 Spondylosis without myelopathy or radiculopathy, cervical region: Secondary | ICD-10-CM | POA: Insufficient documentation

## 2023-03-24 DIAGNOSIS — E7800 Pure hypercholesterolemia, unspecified: Secondary | ICD-10-CM | POA: Insufficient documentation

## 2023-03-24 DIAGNOSIS — N529 Male erectile dysfunction, unspecified: Secondary | ICD-10-CM | POA: Insufficient documentation

## 2023-03-24 DIAGNOSIS — K219 Gastro-esophageal reflux disease without esophagitis: Secondary | ICD-10-CM | POA: Insufficient documentation

## 2023-03-24 DIAGNOSIS — E669 Obesity, unspecified: Secondary | ICD-10-CM | POA: Insufficient documentation

## 2023-03-24 DIAGNOSIS — C679 Malignant neoplasm of bladder, unspecified: Secondary | ICD-10-CM | POA: Insufficient documentation

## 2023-03-24 NOTE — Progress Notes (Signed)
 Subjective:  Patient ID: Christian [name], male    DOB: 30-May-1945,  MRN: [id number] HPI Chief Complaint  Patient presents with   Nail Problem    1st bilateral and 2nd toe left - thick, discolored nails, tried soaking few months ago    78 y.o. male presents with the above complaint.   ROS: Denies fever chills nausea vomit muscle aches pains calf pain back pain chest pain shortness of breath.  Has a lot of traveling coming up so he does not want to have any procedures performed.  Past Medical History:  Diagnosis Date   Allergic rhinitis    Arthritis    Asthma    baldder cancer    Benign localized prostatic hyperplasia with lower urinary tract symptoms (LUTS)    last uti march 2022   Bladder tumor    BPH (benign prostatic hypertrophy)    COVID 05/2019   mild symptoms x 2 days all sx resolved   DDD (degenerative disc disease), cervical    DDD (degenerative disc disease), lumbar    Edema of right lower extremity    wears ted hose   Factor V Leiden (HCC)    hx of not on Xarleto in 2 years as of 11/2022   Family history of atrial fibrillation    His brother, sister have A. fib, suspects his father also had A. fib   Family history of factor V deficiency    Frequency of urination    GERD (gastroesophageal reflux disease)    Hematuria    Hernia of abdominal wall 07/17/2020   in stomach asymptomatic per pt   History of chronic bronchitis    Hypertension    followed by pcp  and dr Jacinto Halim   Celine Ahr 09-09-2015 low risk with normal perfusion and ef 60%;  ETT 08-31-2017 normal with no evidence ischemia : both in epic)   Nocturia    PAF (paroxysmal atrial fibrillation) [facility])    cardiologist----  dr Jacinto Halim-- dx 2017   RBBB (right bundle branch block)    Varicose veins of right lower extremity with pain    Venous insufficiency of both lower extremities    vascular--- dr fields--  right > left ;  s/p  ablation and phlebectomies   Wears glasses    Past Surgical History:  Procedure  Laterality Date   CATARACT EXTRACTION W/ INTRAOCULAR LENS  IMPLANT, BILATERAL Bilateral 2014   COLONOSCOPY N/A 06/27/2014   Procedure: COLONOSCOPY;  Surgeon: Charolett Bumpers, MD;  Location: [facility];  Service: Endoscopy;  Laterality: N/A;   CYSTOSCOPY WITH FULGERATION N/A 06/01/2019   Procedure: CYSTOSCOPY WITH BLADDER BIOPSY/ FULGERATION;  Surgeon: Rene Paci, MD;  Location: [facility];  Service: Urology;  Laterality: N/A;   ENDOVENOUS ABLATION SAPHENOUS VEIN W/ LASER Right 06/30/2016   EVLA R GSV by Josephina Gip MD    ENDOVENOUS ABLATION SAPHENOUS VEIN W/ LASER Left 07/14/2016   endovenous laser ablation Left greater saphenous vein by Josephina Gip MD    REPAIR CONGENITAL URETHRAL DEFECT  INFANT   SKIN BIOPSY  07/17/2020   right knee biopsy of severe atypsia x 3 bx surgery planned for 08-2020   stab phlebectomy Left 11/24/2016   stab phlebectomy > 20 incisions left leg by Josephina Gip MD    stab phlebectomy  Right `12-29-2016   stab phlebectomy > 20 incisions right leg by Josephina Gip MD    THULIUM LASER TURP (TRANSURETHRAL RESECTION OF PROSTATE) N/A 07/20/2020   Procedure: Morton Peters LASER TURP (  TRANSURETHRAL RESECTION OF PROSTATE), CYSTOSCOPY;  Surgeon: Rene Paci, MD;  Location: [facility];  Service: Urology;  Laterality: N/A;  ONLY NEEDS 60 MIN   TOTAL KNEE ARTHROPLASTY Left 12/09/2022   Procedure: LEFT TOTAL KNEE ARTHROPLASTY;  Surgeon: Marcene Corning, MD;  Location: [facility];  Service: Orthopedics;  Laterality: Left;   TRANSURETHRAL RESECTION OF PROSTATE  03/10/2011   Procedure: TRANSURETHRAL RESECTION OF THE PROSTATE WITH GYRUS INSTRUMENTS;  Surgeon: Valetta Fuller, MD;  Location: [facility];  Service: Urology;  Laterality: N/A;  Gyrus-Saline TURP OWER     Current Outpatient Medications:    tamsulosin (FLOMAX) 0.4 MG CAPS capsule, Take 0.4 mg by mouth daily., Disp: , Rfl:    acetaminophen (TYLENOL) 650 MG  CR tablet, Take 1,300 mg by mouth daily., Disp: , Rfl:    amLODipine (NORVASC) 5 MG tablet, Take 5 mg by mouth at bedtime., Disp: , Rfl:    atorvastatin (LIPITOR) 10 MG tablet, TAKE 1 TABLET BY MOUTH EVERY DAY, Disp: 90 tablet, Rfl: 2   cephALEXin (KEFLEX) 250 MG capsule, Take 250 mg by mouth every morning., Disp: , Rfl:    Cholecalciferol 125 MCG (5000 UT) capsule, Take 5,000 Units by mouth at bedtime. , Disp: , Rfl:    Coenzyme Q10 (COQ10) 100 MG CAPS, Take 100 mg by mouth at bedtime., Disp: , Rfl:    flecainide (TAMBOCOR) 100 MG tablet, TAKE ONE TABLET BY MOUTH AT BREAKFAST AND AT BEDTIME, Disp: 180 tablet, Rfl: 3   fluticasone (FLONASE) 50 MCG/ACT nasal spray, Place 1-2 sprays into both nostrils daily., Disp: , Rfl:    levocetirizine (XYZAL) 5 MG tablet, Take 5 mg by mouth at bedtime., Disp: , Rfl:    losartan (COZAAR) 100 MG tablet, Take 100 mg by mouth daily., Disp: , Rfl:    melatonin 5 MG TABS, Take 5 mg by mouth at bedtime., Disp: , Rfl:    metoprolol tartrate (LOPRESSOR) 25 MG tablet, Take 25 mg by mouth 2 (two) times daily., Disp: , Rfl:    montelukast (SINGULAIR) 10 MG tablet, Take 10 mg by mouth at bedtime., Disp: , Rfl: 3   Multiple Vitamins-Minerals (CENTRUM SILVER PO), Take 1 tablet by mouth at bedtime. , Disp: , Rfl:    omeprazole (PRILOSEC) 20 MG capsule, Take 20 mg by mouth every other day., Disp: , Rfl:    SYMBICORT 160-4.5 MCG/ACT inhaler, Inhale 2 puffs into the lungs in the morning and at bedtime. , Disp: , Rfl:    zolpidem (AMBIEN) 5 MG tablet, Take 5 mg by mouth at bedtime as needed for sleep. , Disp: , Rfl:   Allergies  Allergen Reactions   Gin [Alcohol] Other (See Comments)    "Pins and needles feeling and blotches on my neck"   Other     Bcg caused blood clot and flu like symptoms   Adhesive [Tape] Other (See Comments)    Redness/ irritation   Lobster [Shellfish Allergy] Hives    And per pt positive Allergy test   Review of Systems Objective:  There were no  vitals filed for this visit.  General: Well developed, nourished, in no acute distress, alert and oriented x3   Dermatological: Skin is warm, dry and supple bilateral. Nails x 10 are well maintained; remaining integument appears unremarkable at this time. There are no open sores, no preulcerative lesions, no rash or signs of infection present.  Dystrophic nails hallux bilateral second digit left cannot rule out onychomycosis.  Vascular:  Dorsalis Pedis artery and Posterior Tibial artery pedal pulses are 2/4 bilateral with immedate capillary fill time. Pedal hair growth present. No varicosities and no lower extremity edema present bilateral.   Neruologic: Grossly intact via light touch bilateral. Vibratory intact via tuning fork bilateral. Protective threshold with Semmes Wienstein monofilament intact to all pedal sites bilateral. Patellar and Achilles deep tendon reflexes 2+ bilateral. No Babinski or clonus noted bilateral.   Musculoskeletal: No gross boney pedal deformities bilateral. No pain, crepitus, or limitation noted with foot and ankle range of motion bilateral. Muscular strength 5/5 in all groups tested bilateral.  Gait: Unassisted, Nonantalgic.    Radiographs:  None taken  Assessment & Plan:   Assessment: Ingrown toenail tibial-fibular border of the hallux bilateral nail dystrophy hallux bilateral and second digit left foot.  Plan: Will follow-up with him in June for matrixectomy's partial tibial-fibular border of the hallux bilateral.  Also will take samples of the skin and nail today for pathologic evaluation we will follow-up with him with those results once he returns.     Gethsemane Fischler T. [place], [place]

## 2023-03-25 ENCOUNTER — Encounter: Payer: Self-pay | Admitting: Podiatry

## 2023-03-27 DIAGNOSIS — M1711 Unilateral primary osteoarthritis, right knee: Secondary | ICD-10-CM | POA: Diagnosis not present

## 2023-04-07 DIAGNOSIS — E6609 Other obesity due to excess calories: Secondary | ICD-10-CM | POA: Diagnosis not present

## 2023-04-07 DIAGNOSIS — M545 Low back pain, unspecified: Secondary | ICD-10-CM | POA: Diagnosis not present

## 2023-04-07 DIAGNOSIS — I4891 Unspecified atrial fibrillation: Secondary | ICD-10-CM | POA: Diagnosis not present

## 2023-04-07 DIAGNOSIS — F1090 Alcohol use, unspecified, uncomplicated: Secondary | ICD-10-CM | POA: Diagnosis not present

## 2023-04-07 DIAGNOSIS — I10 Essential (primary) hypertension: Secondary | ICD-10-CM | POA: Diagnosis not present

## 2023-04-07 DIAGNOSIS — M25561 Pain in right knee: Secondary | ICD-10-CM | POA: Diagnosis not present

## 2023-04-07 DIAGNOSIS — G8929 Other chronic pain: Secondary | ICD-10-CM | POA: Diagnosis not present

## 2023-04-07 DIAGNOSIS — E7800 Pure hypercholesterolemia, unspecified: Secondary | ICD-10-CM | POA: Diagnosis not present

## 2023-04-07 DIAGNOSIS — N401 Enlarged prostate with lower urinary tract symptoms: Secondary | ICD-10-CM | POA: Diagnosis not present

## 2023-04-08 DIAGNOSIS — R278 Other lack of coordination: Secondary | ICD-10-CM | POA: Diagnosis not present

## 2023-04-08 DIAGNOSIS — M545 Low back pain, unspecified: Secondary | ICD-10-CM | POA: Diagnosis not present

## 2023-04-10 DIAGNOSIS — R278 Other lack of coordination: Secondary | ICD-10-CM | POA: Diagnosis not present

## 2023-04-10 DIAGNOSIS — M545 Low back pain, unspecified: Secondary | ICD-10-CM | POA: Diagnosis not present

## 2023-04-13 DIAGNOSIS — R278 Other lack of coordination: Secondary | ICD-10-CM | POA: Diagnosis not present

## 2023-04-13 DIAGNOSIS — M545 Low back pain, unspecified: Secondary | ICD-10-CM | POA: Diagnosis not present

## 2023-04-13 DIAGNOSIS — M25561 Pain in right knee: Secondary | ICD-10-CM | POA: Diagnosis not present

## 2023-04-14 ENCOUNTER — Other Ambulatory Visit: Payer: Self-pay | Admitting: Podiatry

## 2023-04-20 DIAGNOSIS — R278 Other lack of coordination: Secondary | ICD-10-CM | POA: Diagnosis not present

## 2023-04-20 DIAGNOSIS — M545 Low back pain, unspecified: Secondary | ICD-10-CM | POA: Diagnosis not present

## 2023-04-22 DIAGNOSIS — R278 Other lack of coordination: Secondary | ICD-10-CM | POA: Diagnosis not present

## 2023-04-22 DIAGNOSIS — M545 Low back pain, unspecified: Secondary | ICD-10-CM | POA: Diagnosis not present

## 2023-04-24 DIAGNOSIS — M545 Low back pain, unspecified: Secondary | ICD-10-CM | POA: Diagnosis not present

## 2023-04-24 DIAGNOSIS — R278 Other lack of coordination: Secondary | ICD-10-CM | POA: Diagnosis not present

## 2023-04-27 DIAGNOSIS — M1711 Unilateral primary osteoarthritis, right knee: Secondary | ICD-10-CM | POA: Diagnosis not present

## 2023-04-28 DIAGNOSIS — M545 Low back pain, unspecified: Secondary | ICD-10-CM | POA: Diagnosis not present

## 2023-04-28 DIAGNOSIS — R278 Other lack of coordination: Secondary | ICD-10-CM | POA: Diagnosis not present

## 2023-05-01 DIAGNOSIS — R278 Other lack of coordination: Secondary | ICD-10-CM | POA: Diagnosis not present

## 2023-05-01 DIAGNOSIS — M545 Low back pain, unspecified: Secondary | ICD-10-CM | POA: Diagnosis not present

## 2023-05-04 DIAGNOSIS — M545 Low back pain, unspecified: Secondary | ICD-10-CM | POA: Diagnosis not present

## 2023-05-04 DIAGNOSIS — R278 Other lack of coordination: Secondary | ICD-10-CM | POA: Diagnosis not present

## 2023-05-11 DIAGNOSIS — R278 Other lack of coordination: Secondary | ICD-10-CM | POA: Diagnosis not present

## 2023-05-11 DIAGNOSIS — M545 Low back pain, unspecified: Secondary | ICD-10-CM | POA: Diagnosis not present

## 2023-05-14 DIAGNOSIS — R278 Other lack of coordination: Secondary | ICD-10-CM | POA: Diagnosis not present

## 2023-05-14 DIAGNOSIS — M545 Low back pain, unspecified: Secondary | ICD-10-CM | POA: Diagnosis not present

## 2023-05-18 DIAGNOSIS — Z8551 Personal history of malignant neoplasm of bladder: Secondary | ICD-10-CM | POA: Diagnosis not present

## 2023-05-18 DIAGNOSIS — R351 Nocturia: Secondary | ICD-10-CM | POA: Diagnosis not present

## 2023-05-18 DIAGNOSIS — N401 Enlarged prostate with lower urinary tract symptoms: Secondary | ICD-10-CM | POA: Diagnosis not present

## 2023-05-19 DIAGNOSIS — R278 Other lack of coordination: Secondary | ICD-10-CM | POA: Diagnosis not present

## 2023-05-19 DIAGNOSIS — M545 Low back pain, unspecified: Secondary | ICD-10-CM | POA: Diagnosis not present

## 2023-06-14 DIAGNOSIS — R059 Cough, unspecified: Secondary | ICD-10-CM | POA: Diagnosis not present

## 2023-06-16 ENCOUNTER — Encounter: Payer: Self-pay | Admitting: Cardiology

## 2023-06-16 DIAGNOSIS — I480 Paroxysmal atrial fibrillation: Secondary | ICD-10-CM

## 2023-06-16 MED ORDER — FLECAINIDE ACETATE 100 MG PO TABS: 100.0000 mg | ORAL_TABLET | Freq: Two times a day (BID) | ORAL | 0 refills | Status: DC

## 2023-06-24 DIAGNOSIS — M1711 Unilateral primary osteoarthritis, right knee: Secondary | ICD-10-CM | POA: Diagnosis not present

## 2023-07-07 ENCOUNTER — Encounter: Payer: Self-pay | Admitting: Podiatry

## 2023-07-07 ENCOUNTER — Ambulatory Visit: Admitting: Podiatry

## 2023-07-07 DIAGNOSIS — L603 Nail dystrophy: Secondary | ICD-10-CM

## 2023-07-07 DIAGNOSIS — M25561 Pain in right knee: Secondary | ICD-10-CM | POA: Diagnosis not present

## 2023-07-07 NOTE — Progress Notes (Signed)
 He presents today for follow-up of his nail pathology as well as to consider having his nail procedures performed.  Objective: Also stable alert and oriented x 3.  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  He does have sharp incurvated nail margins particularly the hallux right.  Assessment: Nail dystrophy for pathology ingrown nail hallux right over left.  Plan: At this point he is will be traveling so he would like to wait till September to have his matrixectomy's performed.

## 2023-07-08 DIAGNOSIS — L820 Inflamed seborrheic keratosis: Secondary | ICD-10-CM | POA: Diagnosis not present

## 2023-07-08 DIAGNOSIS — D485 Neoplasm of uncertain behavior of skin: Secondary | ICD-10-CM | POA: Diagnosis not present

## 2023-07-13 DIAGNOSIS — I10 Essential (primary) hypertension: Secondary | ICD-10-CM | POA: Diagnosis not present

## 2023-07-13 DIAGNOSIS — I4891 Unspecified atrial fibrillation: Secondary | ICD-10-CM | POA: Diagnosis not present

## 2023-07-13 DIAGNOSIS — E7800 Pure hypercholesterolemia, unspecified: Secondary | ICD-10-CM | POA: Diagnosis not present

## 2023-07-13 DIAGNOSIS — J42 Unspecified chronic bronchitis: Secondary | ICD-10-CM | POA: Diagnosis not present

## 2023-08-13 ENCOUNTER — Other Ambulatory Visit (HOSPITAL_COMMUNITY): Payer: Self-pay

## 2023-08-13 ENCOUNTER — Ambulatory Visit: Attending: Cardiology | Admitting: Cardiology

## 2023-08-13 ENCOUNTER — Encounter: Payer: Self-pay | Admitting: Cardiology

## 2023-08-13 VITALS — BP 118/78 | HR 53 | Resp 16 | Ht 70.0 in | Wt 223.2 lb

## 2023-08-13 DIAGNOSIS — I10 Essential (primary) hypertension: Secondary | ICD-10-CM | POA: Diagnosis not present

## 2023-08-13 DIAGNOSIS — J42 Unspecified chronic bronchitis: Secondary | ICD-10-CM | POA: Diagnosis not present

## 2023-08-13 DIAGNOSIS — I4891 Unspecified atrial fibrillation: Secondary | ICD-10-CM | POA: Diagnosis not present

## 2023-08-13 DIAGNOSIS — I480 Paroxysmal atrial fibrillation: Secondary | ICD-10-CM

## 2023-08-13 DIAGNOSIS — E7800 Pure hypercholesterolemia, unspecified: Secondary | ICD-10-CM | POA: Diagnosis not present

## 2023-08-13 MED ORDER — APIXABAN 5 MG PO TABS
5.0000 mg | ORAL_TABLET | Freq: Two times a day (BID) | ORAL | 3 refills | Status: DC
  Filled 2023-08-13: qty 180, 90d supply, fill #0

## 2023-08-13 NOTE — Progress Notes (Signed)
 Cardiology Office Note:  .   Date:  08/13/2023  ID:  Christian [name], DOB Jan 19, 1945, MRN [id number] PCP: Christian Dorn [name], MD  [facility] Providers Cardiologist:  [name], MD   History of Present Illness: .   Christian [name] is a 78 y.o.  Caucasian male  with a history of paroxysmal atrial fibrillation, presently on flecainide  100 mg p.o. b.i.d, history of bilateral venous insufficiency S/P ablation left leg in 2019, hypertension with intolerance to amlodipine high-dose due to leg edema and HCTZ causing hyponatremia, hyperglycemia and asymptomatic S. Bradycardia and excessive alcohol use and mild obesity.  To reduce alcohol intake and to treat his obesity, I had placed him on naltrexone  50 mg daily which helped however this has been discontinued now.  Discussed the use of AI scribe software for clinical note transcription with the patient, who gave verbal consent to proceed.  History of Present Illness Christian [name] is a 78 year old male who presents with joint pain and alcohol use concerns.  He has paroxysmal atrial fibrillation and takes flecainide  100 mg twice daily. He previously discontinued blood thinners due to back pain and used NSAIDs and gabapentin. He currently takes ibuprofen 400 mg most days for knee and back pain. He denies recent episodes of atrial fibrillation.  He experiences worsening edema in his left leg post-surgery and notes worsening varicose veins. He denies any episodes of melena or hematochezia.  He expresses concerns about heavy alcohol consumption. He previously used naltrexone , which reduced his drinking and weight but caused depression. He abstained from alcohol for 21 days around his knee surgery and is interested in reducing his intake again.  Labs   External Labs:  PCP labs on Care Everywhere 03/05/2023:  Total cholesterol 140, triglycerides 128, HDL 53, LDL 64.  Vitamin D 70.1.  TSH normal at 3.99.  B12 lower limit of normal at 235.  A1c  5.8%.  Serum glucose 110 mg, BUN 18, creatinine 1.18, EGFR 64 mL, potassium 4.4, LFTs normal.  Hb 14.1/HCT 41.8, platelets 359.  ROS  Review of Systems  Cardiovascular:  Negative for chest pain, dyspnea on exertion and leg swelling.   Physical Exam:   VS:  BP 118/78 (BP Location: Left Arm, Patient Position: Sitting, Cuff Size: Large)   Pulse (!) 53   Resp 16   Ht 5' 10 (1.778 m)   Wt 223 lb 3.2 oz (101.2 kg)   SpO2 100%   BMI 32.03 kg/m    Wt Readings from Last 3 Encounters:  08/13/23 223 lb 3.2 oz (101.2 kg)  12/09/22 222 lb 0.1 oz (100.7 kg)  12/01/22 222 lb (100.7 kg)    Physical Exam Neck:     Vascular: No carotid bruit or JVD.  Cardiovascular:     Rate and Rhythm: Normal rate and regular rhythm.     Pulses: Intact distal pulses.     Heart sounds: Normal heart sounds. No murmur heard.    No gallop.  Pulmonary:     Effort: Pulmonary effort is normal.     Breath sounds: Normal breath sounds.  Abdominal:     General: Bowel sounds are normal.     Palpations: Abdomen is soft.  Musculoskeletal:     Right lower leg: No edema.     Left lower leg: Edema (1-2+ trace to mild leg edema pitting) present.    Studies Reviewed: SABRA    Exercise treadmill stress test 12/16/2021: Exercise treadmill stress test performed using Bruce protocol.  Patient reached 8.3 METS, and 79% of age predicted maximum heart rate.  Exercise capacity was good.  No chest pain reported.  Normal heart rate and hemodynamic response. No atrial fibrillation seen. Stress EKG showed sinus tachycardia, RBBB, LAFB, 1-2 mm upsloping ST depressions in leads I, II, aVL, V5, V6 with partial normalization by 2 min into recovery. These changes are equivocal for ischemia. Intermediate risk study. Previous ETT in 2019 reported only RBB related precordial EKG changes at 10.1 METS and achieved 82% MPHR.  Echocardiogram 12/13/2021: Study Quality: Good but no subcostal images. Normal LV systolic function with visual EF  60-65%. Left ventricle cavity is normal in size. Normal left ventricular wall thickness. Normal global wall motion. Normal diastolic filling pattern, normal LAP. Mild tricuspid regurgitation. No evidence of pulmonary hypertension. RVSP measures 33 mmHg. Mild pulmonic regurgitation. Compared to 09/05/2015 otherwise no significant change.  EKG:    EKG Interpretation Date/Time:  Thursday August 13 2023 10:10:08 EDT Ventricular Rate:  51 PR Interval:  182 QRS Duration:  170 QT Interval:  524 QTC Calculation: 482 R Axis:   -12  Text Interpretation: EKG 08/13/2023: Normal sinus rhythm at rate of 51 bpm. Right bundle branch block.  Bifascicular block. No significant change from 08/11/2022.   Compared to 08/28/2015, atrial fibrillation no longer present otherwise no change. Confirmed by [name], Jagadeesh [phone]) on 08/13/2023 10:20:24 AM  EKG 08/11/2022: Marked sinus bradycardia at rate of 53 bpm, right bundle branch block.   Medications ordered    No orders of the defined types were placed in this encounter.    ASSESSMENT AND PLAN: .      ICD-10-CM   1. Paroxysmal atrial fibrillation (HCC)  I48.0 EKG 12-Lead     Assessment & Plan Paroxysmal atrial fibrillation on antiarrhythmic and anticoagulation therapy Paroxysmal atrial fibrillation is well-controlled with flecainide  100 mg twice daily. Previously stopped anticoagulation due to back pain and NSAID use. CHADS-VASc score is at least 3, indicating a need for anticoagulation. Discussed risks of bleeding with NSAID use and benefits of anticoagulation with Eliquis  over Xarelto  due to a slightly better bleeding risk profile. - Start Eliquis  5 mg twice daily - Continue flecainide  100 mg twice daily - Discuss NSAID use with knee surgeon  Hypertension Hypertension is managed with amlodipine, metoprolol , and losartan . Blood pressure control is well-managed. - Continue amlodipine 5 mg once daily - Continue metoprolol  tartrate 25 mg twice daily -  Continue losartan  100 mg daily  Excessive alcohol use Reports heavy drinking and interest in reducing alcohol consumption. Previously used naltrexone  with some success in reducing alcohol intake and weight. Concerns about side effects such as depression when using naltrexone . Discussed psychological dependence and potential benefits of AAA or psychologist consultation. - Coordinate with PCP regarding restarting naltrexone  - Coordinate follow-up with Dr. Dorn Sauers for monitoring - Discuss potential side effects and psychological support options  Obesity Body mass index is 32. Weight has decreased from 226 lbs to 223 lbs. Previous weight loss noted with naltrexone  use. - Monitor weight and consider lifestyle modifications  Chronic bilateral knee pain, status post left knee replacement Chronic knee pain persists post left knee replacement. Right knee not deemed severe enough for surgery. Current pain management includes NSAIDs, which may increase bleeding risk. Discussed alternative pain management options such as Voltaren gel. - Discuss knee pain with orthopedic surgeon - Consider consistent use of Voltaren gel every 4-5 hours initially, then reduce frequency  Chronic bilateral lower extremity edema, worse on left Post-surgical left leg  edema is worse than right. Common post-surgery complication.  Diarrhea and bloating due to supplements Diarrhea and bloating likely due to fish oil and osteobiflex supplements. Turmeric less likely to be the cause. Discussed stopping supplements to identify the cause. - Stop fish oil and osteobiflex for a couple of weeks - Reintroduce supplements one at a time to identify cause of symptoms   Signed,  [name], MD, [facility] 08/13/2023, 10:26 AM [facility] [address] [place], [place] [address] Phone: [phone]. Fax:  [phone]

## 2023-08-13 NOTE — Patient Instructions (Signed)
 Medication Instructions:  Your physician has recommended you make the following change in your medication:  Start Eliquis  5 mg by mouth twice daily   *If you need a refill on your cardiac medications before your next appointment, please call your pharmacy*  Lab Work: none If you have labs (blood work) drawn today and your tests are completely normal, you will receive your results only by: MyChart Message (if you have MyChart) OR A paper copy in the mail If you have any lab test that is abnormal or we need to change your treatment, we will call you to review the results.  Testing/Procedures: none  Follow-Up: At [facility], you and your health needs are our priority.  As part of our continuing mission to provide you with exceptional heart care, our providers are all part of one team.  This team includes your primary Cardiologist (physician) and Advanced Practice Providers or APPs (Physician Assistants and Nurse Practitioners) who all work together to provide you with the care you need, when you need it.  Your next appointment:   12 month(s)  Provider:   [name], MD    We recommend signing up for the patient portal called MyChart.  Sign up information is provided on this After Visit Summary.  MyChart is used to connect with patients for Virtual Visits (Telemedicine).  Patients are able to view lab/test results, encounter notes, upcoming appointments, etc.  Non-urgent messages can be sent to your provider as well.   To learn more about what you can do with MyChart, go to [email or web address].   Other Instructions

## 2023-08-14 ENCOUNTER — Encounter: Payer: Self-pay | Admitting: Cardiology

## 2023-08-19 DIAGNOSIS — M7022 Olecranon bursitis, left elbow: Secondary | ICD-10-CM | POA: Diagnosis not present

## 2023-08-31 DIAGNOSIS — E871 Hypo-osmolality and hyponatremia: Secondary | ICD-10-CM | POA: Diagnosis not present

## 2023-08-31 DIAGNOSIS — R7303 Prediabetes: Secondary | ICD-10-CM | POA: Diagnosis not present

## 2023-08-31 DIAGNOSIS — Z0000 Encounter for general adult medical examination without abnormal findings: Secondary | ICD-10-CM | POA: Diagnosis not present

## 2023-08-31 DIAGNOSIS — E559 Vitamin D deficiency, unspecified: Secondary | ICD-10-CM | POA: Diagnosis not present

## 2023-08-31 LAB — LAB REPORT - SCANNED
A1c: 5.8
EGFR: 59

## 2023-09-01 DIAGNOSIS — E7800 Pure hypercholesterolemia, unspecified: Secondary | ICD-10-CM | POA: Diagnosis not present

## 2023-09-01 DIAGNOSIS — I4891 Unspecified atrial fibrillation: Secondary | ICD-10-CM | POA: Diagnosis not present

## 2023-09-01 DIAGNOSIS — K219 Gastro-esophageal reflux disease without esophagitis: Secondary | ICD-10-CM | POA: Diagnosis not present

## 2023-09-01 DIAGNOSIS — J45909 Unspecified asthma, uncomplicated: Secondary | ICD-10-CM | POA: Diagnosis not present

## 2023-09-01 DIAGNOSIS — M545 Low back pain, unspecified: Secondary | ICD-10-CM | POA: Diagnosis not present

## 2023-09-01 DIAGNOSIS — G8929 Other chronic pain: Secondary | ICD-10-CM | POA: Diagnosis not present

## 2023-09-01 DIAGNOSIS — R7303 Prediabetes: Secondary | ICD-10-CM | POA: Diagnosis not present

## 2023-09-01 DIAGNOSIS — I10 Essential (primary) hypertension: Secondary | ICD-10-CM | POA: Diagnosis not present

## 2023-09-01 DIAGNOSIS — F1090 Alcohol use, unspecified, uncomplicated: Secondary | ICD-10-CM | POA: Diagnosis not present

## 2023-09-01 DIAGNOSIS — E6609 Other obesity due to excess calories: Secondary | ICD-10-CM | POA: Diagnosis not present

## 2023-09-01 DIAGNOSIS — N401 Enlarged prostate with lower urinary tract symptoms: Secondary | ICD-10-CM | POA: Diagnosis not present

## 2023-09-01 DIAGNOSIS — M25561 Pain in right knee: Secondary | ICD-10-CM | POA: Diagnosis not present

## 2023-09-02 ENCOUNTER — Encounter: Payer: Self-pay | Admitting: Internal Medicine

## 2023-09-11 ENCOUNTER — Other Ambulatory Visit: Payer: Self-pay | Admitting: Cardiology

## 2023-09-11 DIAGNOSIS — I480 Paroxysmal atrial fibrillation: Secondary | ICD-10-CM

## 2023-09-13 DIAGNOSIS — I10 Essential (primary) hypertension: Secondary | ICD-10-CM | POA: Diagnosis not present

## 2023-09-13 DIAGNOSIS — E7800 Pure hypercholesterolemia, unspecified: Secondary | ICD-10-CM | POA: Diagnosis not present

## 2023-09-13 DIAGNOSIS — J42 Unspecified chronic bronchitis: Secondary | ICD-10-CM | POA: Diagnosis not present

## 2023-09-13 DIAGNOSIS — I4891 Unspecified atrial fibrillation: Secondary | ICD-10-CM | POA: Diagnosis not present

## 2023-09-22 DIAGNOSIS — L720 Epidermal cyst: Secondary | ICD-10-CM | POA: Diagnosis not present

## 2023-09-22 DIAGNOSIS — D1722 Benign lipomatous neoplasm of skin and subcutaneous tissue of left arm: Secondary | ICD-10-CM | POA: Diagnosis not present

## 2023-09-22 DIAGNOSIS — L565 Disseminated superficial actinic porokeratosis (DSAP): Secondary | ICD-10-CM | POA: Diagnosis not present

## 2023-09-22 DIAGNOSIS — L570 Actinic keratosis: Secondary | ICD-10-CM | POA: Diagnosis not present

## 2023-09-22 DIAGNOSIS — D1730 Benign lipomatous neoplasm of skin and subcutaneous tissue of unspecified sites: Secondary | ICD-10-CM | POA: Diagnosis not present

## 2023-09-22 DIAGNOSIS — D239 Other benign neoplasm of skin, unspecified: Secondary | ICD-10-CM | POA: Diagnosis not present

## 2023-09-22 DIAGNOSIS — L821 Other seborrheic keratosis: Secondary | ICD-10-CM | POA: Diagnosis not present

## 2023-09-22 DIAGNOSIS — L578 Other skin changes due to chronic exposure to nonionizing radiation: Secondary | ICD-10-CM | POA: Diagnosis not present

## 2023-09-24 ENCOUNTER — Ambulatory Visit: Admitting: Podiatry

## 2023-10-13 DIAGNOSIS — J42 Unspecified chronic bronchitis: Secondary | ICD-10-CM | POA: Diagnosis not present

## 2023-10-13 DIAGNOSIS — I10 Essential (primary) hypertension: Secondary | ICD-10-CM | POA: Diagnosis not present

## 2023-10-13 DIAGNOSIS — E7800 Pure hypercholesterolemia, unspecified: Secondary | ICD-10-CM | POA: Diagnosis not present

## 2023-10-13 DIAGNOSIS — I4891 Unspecified atrial fibrillation: Secondary | ICD-10-CM | POA: Diagnosis not present

## 2023-11-09 DIAGNOSIS — N401 Enlarged prostate with lower urinary tract symptoms: Secondary | ICD-10-CM | POA: Diagnosis not present

## 2023-11-13 ENCOUNTER — Encounter: Payer: Self-pay | Admitting: Podiatry

## 2023-11-13 DIAGNOSIS — J42 Unspecified chronic bronchitis: Secondary | ICD-10-CM | POA: Diagnosis not present

## 2023-11-13 DIAGNOSIS — E7800 Pure hypercholesterolemia, unspecified: Secondary | ICD-10-CM | POA: Diagnosis not present

## 2023-11-13 DIAGNOSIS — I4891 Unspecified atrial fibrillation: Secondary | ICD-10-CM | POA: Diagnosis not present

## 2023-11-13 DIAGNOSIS — I10 Essential (primary) hypertension: Secondary | ICD-10-CM | POA: Diagnosis not present

## 2023-11-16 DIAGNOSIS — R3915 Urgency of urination: Secondary | ICD-10-CM | POA: Diagnosis not present

## 2023-11-16 DIAGNOSIS — R399 Unspecified symptoms and signs involving the genitourinary system: Secondary | ICD-10-CM | POA: Diagnosis not present

## 2023-11-16 DIAGNOSIS — Z8551 Personal history of malignant neoplasm of bladder: Secondary | ICD-10-CM | POA: Diagnosis not present

## 2023-11-16 DIAGNOSIS — N401 Enlarged prostate with lower urinary tract symptoms: Secondary | ICD-10-CM | POA: Diagnosis not present

## 2023-11-30 ENCOUNTER — Emergency Department (HOSPITAL_BASED_OUTPATIENT_CLINIC_OR_DEPARTMENT_OTHER)
Admission: EM | Admit: 2023-11-30 | Discharge: 2023-11-30 | Disposition: A | Discharge status: Home / Self Care | Attending: Emergency Medicine | Admitting: Emergency Medicine

## 2023-11-30 ENCOUNTER — Other Ambulatory Visit: Payer: Self-pay

## 2023-11-30 ENCOUNTER — Emergency Department (HOSPITAL_BASED_OUTPATIENT_CLINIC_OR_DEPARTMENT_OTHER)

## 2023-11-30 DIAGNOSIS — Z79899 Other long term (current) drug therapy: Secondary | ICD-10-CM | POA: Insufficient documentation

## 2023-11-30 DIAGNOSIS — Z7901 Long term (current) use of anticoagulants: Secondary | ICD-10-CM | POA: Insufficient documentation

## 2023-11-30 DIAGNOSIS — R413 Other amnesia: Secondary | ICD-10-CM | POA: Diagnosis not present

## 2023-11-30 DIAGNOSIS — I10 Essential (primary) hypertension: Secondary | ICD-10-CM | POA: Diagnosis not present

## 2023-11-30 LAB — CBC WITH DIFFERENTIAL/PLATELET
Abs Immature Granulocytes: 0.03 K/uL (ref 0.00–0.07)
Basophils Absolute: 0 K/uL (ref 0.0–0.1)
Basophils Relative: 0 %
Eosinophils Absolute: 0.1 K/uL (ref 0.0–0.5)
Eosinophils Relative: 1 %
HCT: 41.8 % (ref 39.0–52.0)
Hemoglobin: 14.3 g/dL (ref 13.0–17.0)
Immature Granulocytes: 0 %
Lymphocytes Relative: 17 %
Lymphs Abs: 1.6 K/uL (ref 0.7–4.0)
MCH: 32.9 pg (ref 26.0–34.0)
MCHC: 34.2 g/dL (ref 30.0–36.0)
MCV: 96.3 fL (ref 80.0–100.0)
Monocytes Absolute: 0.9 K/uL (ref 0.1–1.0)
Monocytes Relative: 10 %
Neutro Abs: 6.9 K/uL (ref 1.7–7.7)
Neutrophils Relative %: 72 %
Platelets: 286 K/uL (ref 150–400)
RBC: 4.34 MIL/uL (ref 4.22–5.81)
RDW: 13.2 % (ref 11.5–15.5)
WBC: 9.5 K/uL (ref 4.0–10.5)
nRBC: 0 % (ref 0.0–0.2)

## 2023-11-30 LAB — BASIC METABOLIC PANEL WITH GFR
Anion gap: 12 (ref 5–15)
BUN: 30 mg/dL — ABNORMAL HIGH (ref 8–23)
CO2: 22 mmol/L (ref 22–32)
Calcium: 9.3 mg/dL (ref 8.9–10.3)
Chloride: 103 mmol/L (ref 98–111)
Creatinine, Ser: 1.14 mg/dL (ref 0.61–1.24)
GFR, Estimated: >60 mL/min (ref >60)
Glucose, Bld: 106 mg/dL — ABNORMAL HIGH (ref 70–99)
Potassium: 4 mmol/L (ref 3.5–5.1)
Sodium: 136 mmol/L (ref 135–145)

## 2023-11-30 LAB — TSH: TSH: 2.81 u[IU]/mL (ref 0.350–4.500)

## 2023-11-30 LAB — FOLATE: Folate: >20 ng/mL (ref >5.9)

## 2023-11-30 LAB — VITAMIN B12: Vitamin B-12: 341 pg/mL (ref 180–914)

## 2023-11-30 NOTE — ED Notes (Signed)
 Reviewed AVS/discharge instructions with patient. Time allotted for and all questions answered. Patient is agreeable for d/c and escorted to ED exit by staff.

## 2023-11-30 NOTE — ED Triage Notes (Signed)
 Reports 2 episodes today of memory loss. States had difficulties remembering how to play a card game and take vitals. A&Ox4. Able to form new memories. States all symptoms have resolved.  No unilateral deficits.   LKN 1200 today.

## 2023-11-30 NOTE — Discharge Instructions (Signed)
 It was our pleasure to provide your ER care today - we hope that you feel better. Your labs that have resulted so far look good/normal. Some labs remain pending - you may check MyChart for results or follow up with your doctor in the coming week for results.   Occasionally sildenafil and similar meds can cause transient memory issues - avoid taking further doses until follow up with your doctor.   For recent memory issue, follow up closely with neurologist in the coming week.   Return to [facility] ER if worse, new symptoms, fevers, new/severe pain, change in speech or vision, one-sided numbness/weakness, or other concern.

## 2023-11-30 NOTE — ED Provider Notes (Signed)
 [place] EMERGENCY DEPARTMENT AT [facility] Provider Note   CSN: [id number] Arrival date & time: 11/30/23  1418     Patient presents with: Memory Loss   Christian [name] is a 78 y.o. male.   Pt with c/o transiently feeling as if had acute decrease in memory earlier today. Indicates things like remembering specific moves/strategy while playing bridge were difficult, and indicates became aware of not knowing how to take his vital signs.  In prior charts from Feb/March of  this year, note made of pcp visit related to memory concerns, ?mild cognitive decline. Indicates todays specific issues with memory were new. Previously when memory issues was felt possible related to gabapentin, and is not currently taking. Only new med he took earlier today was a dose of viagra which he had not taken in several months. No other recent change in meds. No recent acute illness, fever/chills, or pain. Physically feels fine, at baseline. No new pain or headache. No change in speech or vision. No new numbness/weakness or loss of normal functional ability. No problems w gait or balance.   The history is provided by the patient, the spouse and medical records.       Prior to Admission medications   Medication Sig Start Date End Date Taking? Authorizing Provider  amLODipine (NORVASC) 5 MG tablet Take 5 mg by mouth at bedtime. 07/29/22   [provider]  apixaban  (ELIQUIS ) 5 MG TABS tablet Take 1 tablet (5 mg total) by mouth 2 (two) times daily. 08/13/23   Ladona Heinz, MD  atorvastatin  (LIPITOR) 10 MG tablet TAKE 1 TABLET BY MOUTH EVERY DAY 09/21/18   Ladona Heinz, MD  Boswellia-Glucosamine-Vit D (OSTEO BI-FLEX ONE PER DAY PO) Take 1 tablet by mouth daily.    [provider]  cephALEXin  (KEFLEX ) 250 MG capsule Take 250 mg by mouth every morning. 07/29/22   [provider]  Cholecalciferol  125 MCG (5000 UT) capsule Take 5,000 Units by mouth at bedtime.     [provider]   Coenzyme Q10 (COQ10) 100 MG CAPS Take 100 mg by mouth at bedtime.    [provider]  FISH OIL Take 1 capsule by mouth daily. 07/14/23   [provider]  flecainide  (TAMBOCOR ) 100 MG tablet TAKE 1 TABLET BY MOUTH TWICE A DAY 09/11/23   Ladona Heinz, MD  fluticasone  (FLONASE ) 50 MCG/ACT nasal spray Place 1-2 sprays into both nostrils daily.    [provider]  levocetirizine (XYZAL) 5 MG tablet Take 5 mg by mouth at bedtime.    [provider]  losartan  (COZAAR ) 100 MG tablet Take 100 mg by mouth daily. 07/29/22   [provider]  melatonin 5 MG TABS Take 5 mg by mouth at bedtime.    [provider]  metoprolol  tartrate (LOPRESSOR ) 25 MG tablet Take 25 mg by mouth 2 (two) times daily.    [provider]  montelukast  (SINGULAIR ) 10 MG tablet Take 10 mg by mouth at bedtime. 08/16/15   [provider]  Multiple Vitamins-Minerals (CENTRUM SILVER PO) Take 1 tablet by mouth at bedtime.     [provider]  omeprazole (PRILOSEC) 20 MG capsule Take 20 mg by mouth every other day.    [provider]  SYMBICORT 160-4.5 MCG/ACT inhaler Inhale 2 puffs into the lungs in the morning and at bedtime.  02/21/17   [provider]  Turmeric (QC TUMERIC COMPLEX) 500 MG CAPS Take 1 tablet by mouth daily.  [provider]  zolpidem  (AMBIEN ) 5 MG tablet Take 5 mg by mouth at bedtime as needed for sleep.     [provider]    Allergies: Gin [alcohol], Other, Adhesive [tape], and Lobster [shellfish allergy]    Review of Systems  Constitutional:  Negative for chills and fever.  HENT:  Negative for trouble swallowing.   Eyes:  Negative for visual disturbance.  Respiratory:  Negative for shortness of breath.   Cardiovascular:  Negative for chest pain.  Gastrointestinal:  Negative for abdominal pain, nausea and vomiting.  Genitourinary:  Negative for flank pain.  Musculoskeletal:  Negative for back pain and  neck pain.  Neurological:  Negative for dizziness, speech difficulty, weakness, numbness and headaches.    Updated Vital Signs BP (!) 114/98   Pulse (!) 56   Temp 98.1 F (36.7 C)   Resp 17   SpO2 100%   Physical Exam Vitals and nursing note reviewed.  Constitutional:      Appearance: Normal appearance. He is well-developed.  HENT:     Head: Atraumatic.     Comments: No temporal tenderness.     Nose: Nose normal.     Mouth/Throat:     Mouth: Mucous membranes are moist.     Pharynx: Oropharynx is clear.  Eyes:     General: No scleral icterus.    Extraocular Movements: Extraocular movements intact.     Conjunctiva/sclera: Conjunctivae normal.     Pupils: Pupils are equal, round, and reactive to light.  Neck:     Vascular: No carotid bruit.     Trachea: No tracheal deviation.     Comments: No stiffness or rigidity.  Cardiovascular:     Rate and Rhythm: Normal rate and regular rhythm.     Pulses: Normal pulses.     Heart sounds: Normal heart sounds. No murmur heard.    No friction rub. No gallop.  Pulmonary:     Effort: Pulmonary effort is normal. No accessory muscle usage or respiratory distress.     Breath sounds: Normal breath sounds.  Abdominal:     General: There is no distension.     Palpations: Abdomen is soft.     Tenderness: There is no abdominal tenderness.  Genitourinary:    Comments: No cva tenderness. Musculoskeletal:        General: No swelling or tenderness.     Cervical back: Normal range of motion and neck supple. No rigidity.  Skin:    General: Skin is warm and dry.     Findings: No rash.  Neurological:     General: No focal deficit present.     Mental Status: He is alert and oriented to person, place, and time.     Comments: Alert, speech clear. No dysarthria or aphasia noted. Motor/sens fxn grossly intact bil. No pronator drifts. Stre 5/5. Sens grossly intact. Steady gait. Oriented x 3. Recalls day, date, month, year, current events, remote items  such as address, phone number, etc.   Psychiatric:        Mood and Affect: Mood normal.     (all labs ordered are listed, but only abnormal results are displayed) Results for orders placed or performed during the hospital encounter of 11/30/23  CBC with Differential   Collection Time: 11/30/23  2:38 PM  Result Value Ref Range   WBC 9.5 4.0 - 10.5 K/uL   RBC 4.34 4.22 - 5.81 MIL/uL   Hemoglobin 14.3 13.0 - 17.0 g/dL   HCT 58.1 60.9 -  52.0 %   MCV 96.3 80.0 - 100.0 fL   MCH 32.9 26.0 - 34.0 pg   MCHC 34.2 30.0 - 36.0 g/dL   RDW 86.7 88.4 - 84.4 %   Platelets 286 150 - 400 K/uL   nRBC 0.0 0.0 - 0.2 %   Neutrophils Relative % 72 %   Neutro Abs 6.9 1.7 - 7.7 K/uL   Lymphocytes Relative 17 %   Lymphs Abs 1.6 0.7 - 4.0 K/uL   Monocytes Relative 10 %   Monocytes Absolute 0.9 0.1 - 1.0 K/uL   Eosinophils Relative 1 %   Eosinophils Absolute 0.1 0.0 - 0.5 K/uL   Basophils Relative 0 %   Basophils Absolute 0.0 0.0 - 0.1 K/uL   Immature Granulocytes 0 %   Abs Immature Granulocytes 0.03 0.00 - 0.07 K/uL  Basic metabolic panel   Collection Time: 11/30/23  2:38 PM  Result Value Ref Range   Sodium 136 135 - 145 mmol/L   Potassium 4.0 3.5 - 5.1 mmol/L   Chloride 103 98 - 111 mmol/L   CO2 22 22 - 32 mmol/L   Glucose, Bld 106 (H) 70 - 99 mg/dL   BUN 30 (H) 8 - 23 mg/dL   Creatinine, Ser 8.85 0.61 - 1.24 mg/dL   Calcium  9.3 8.9 - 10.3 mg/dL   GFR, Estimated >39 >39 mL/min   Anion gap 12 5 - 15   CT Head Wo Contrast Result Date: 11/30/2023 EXAM: CT HEAD WITHOUT CONTRAST 11/30/2023 02:54:12 PM TECHNIQUE: CT of the head was performed without the administration of intravenous contrast. Automated exposure control, iterative reconstruction, and/or weight based adjustment of the mA/kV was utilized to reduce the radiation dose to as low as reasonably achievable. COMPARISON: None available. CLINICAL HISTORY: Headache, increasing frequency or severity FINDINGS: BRAIN AND VENTRICLES: No acute  hemorrhage. No evidence of acute infarct. No hydrocephalus. No extra-axial collection. No mass effect or midline shift. ORBITS: No acute abnormality. SINUSES: No acute abnormality. SOFT TISSUES AND SKULL: No acute soft tissue abnormality. No skull fracture. IMPRESSION: 1. No acute intracranial abnormality. Electronically signed by: Gilmore Molt MD 11/30/2023 03:11 PM EST RP Workstation: [id number]     EKG: EKG Interpretation Date/Time:  Monday November 30 2023 15:46:58 EST Ventricular Rate:  56 PR Interval:  193 QRS Duration:  185 QT Interval:  514 QTC Calculation: 497 R Axis:   -23  Text Interpretation: Sinus rhythm Right bundle branch block Non-specific ST-t changes Confirmed by Bernard Drivers ([id number]) on 11/30/2023 3:56:00 PM  Radiology: CT Head Wo Contrast Result Date: 11/30/2023 EXAM: CT HEAD WITHOUT CONTRAST 11/30/2023 02:54:12 PM TECHNIQUE: CT of the head was performed without the administration of intravenous contrast. Automated exposure control, iterative reconstruction, and/or weight based adjustment of the mA/kV was utilized to reduce the radiation dose to as low as reasonably achievable. COMPARISON: None available. CLINICAL HISTORY: Headache, increasing frequency or severity FINDINGS: BRAIN AND VENTRICLES: No acute hemorrhage. No evidence of acute infarct. No hydrocephalus. No extra-axial collection. No mass effect or midline shift. ORBITS: No acute abnormality. SINUSES: No acute abnormality. SOFT TISSUES AND SKULL: No acute soft tissue abnormality. No skull fracture. IMPRESSION: 1. No acute intracranial abnormality. Electronically signed by: Gilmore Molt MD 11/30/2023 03:11 PM EST RP Workstation: [id number]     Procedures   Medications Ordered in the ED - No data to display  Medical Decision Making Problems Addressed: Memory loss or impairment: acute illness or injury with systemic symptoms  Amount and/or Complexity of Data  Reviewed Independent Historian: spouse    Details: hx External Data Reviewed: notes. Labs: ordered. Decision-making details documented in ED Course. Radiology: ordered and independent interpretation performed. Decision-making details documented in ED Course. ECG/medicine tests: ordered and independent interpretation performed. Decision-making details documented in ED Course.  Risk Prescription drug management. Decision regarding hospitalization.   Iv ns. Continuous pulse ox and cardiac monitoring. Labs ordered/sent. Imaging ordered.   Differential diagnosis includes memory loss, cognitive decline, side effect medication, etc. Dispo decision including potential need for admission considered - will get labs and imaging and reassess.   Reviewed nursing notes and prior charts for additional history. External reports reviewed. Additional history from: spouse.   Cardiac monitor: sinus rhythm, rate 60.  Labs reviewed/interpreted by me - wbc and hgb normal. Chem normal.   CT reviewed/interpreted by me - no hem.   ? TGA secondary to dose of viagra earlier this AM, vs tia, vs cognitive decline, vs other. Additional labs sent/pending, will f/u as outpt.   Pt currently without focal neuro deficit on exam and appears stable for ED d/c.   Rec close pcp/neurology f/u.  Return precautions provided.       Final diagnoses:  Memory loss or impairment    ED Discharge Orders          Ordered    Ambulatory referral to Neurology       Comments: An appointment is requested in approximately: 1 week   11/30/23 1554               Bernard Drivers, MD 11/30/23 1556

## 2023-11-30 NOTE — ED Notes (Signed)
 PT family member stated the patient had repetitive questions. Was reading paperwork and asked What is Eliquis , Do I take that?, twice. Also family made request for a cup of water  but I withheld at this time due to the need for a assessment. RN made aware

## 2023-12-01 DIAGNOSIS — R413 Other amnesia: Secondary | ICD-10-CM | POA: Diagnosis not present

## 2023-12-01 LAB — RPR: RPR Ser Ql: NONREACTIVE

## 2023-12-08 ENCOUNTER — Ambulatory Visit: Admitting: Podiatry

## 2023-12-08 ENCOUNTER — Encounter: Payer: Self-pay | Admitting: Podiatry

## 2023-12-08 DIAGNOSIS — L600 Ingrowing nail: Secondary | ICD-10-CM

## 2023-12-08 MED ORDER — NEOMYCIN-POLYMYXIN-HC 1 % OT SOLN: OTIC | 1 refills | Status: AC

## 2023-12-08 NOTE — Patient Instructions (Signed)

## 2023-12-08 NOTE — Progress Notes (Signed)
 He presents today chief complaint of ingrown toenail to the fibular border of the right hallux.  States has been there for quite some time and is tender.  He states it currently is not infected.  States that he hurt his back while he was trying to attend to the toes.  Objective: Vital signs are stable he is alert and oriented x 3.  Pulses are strongly palpable bilateral.  Neurologic sensorium is intact deep tendon reflexes are intact.  He has redness with a sharply incurvated nail margin of the hallux right.  The remainder of the nails do not demonstrate crypt ptosis like the right hallux does.  There is no purulence no malodor no sign of aggressive infection.  Assessment: Ingrown toenail fibular border hallux right.  Plan: Minimal matricectomy was performed today after local anesthetic was administered he tolerated procedure well as the nail was split from distal to proximal after local anesthetic was administered.  Once the nail was avulsed from this matrix and phenol was utilized to destroyed the matrix and was neutralized with isopropyl alcohol.  Surgicel to help prevent bleeding since he is on Eliquis  was applied as well as Betadine  ointment and a dry sterile compressive dressing.  He was given both oral and written home-going instruction of the care and soaking of the toe as well as a prescription for Corticosporin otic to be applied twice daily after soaking.  He will be going to [place]  to see his grandchildren.

## 2023-12-09 ENCOUNTER — Encounter: Payer: Self-pay | Admitting: Podiatry

## 2023-12-13 DIAGNOSIS — J42 Unspecified chronic bronchitis: Secondary | ICD-10-CM | POA: Diagnosis not present

## 2023-12-13 DIAGNOSIS — I10 Essential (primary) hypertension: Secondary | ICD-10-CM | POA: Diagnosis not present

## 2023-12-13 DIAGNOSIS — E7800 Pure hypercholesterolemia, unspecified: Secondary | ICD-10-CM | POA: Diagnosis not present

## 2023-12-13 DIAGNOSIS — I4891 Unspecified atrial fibrillation: Secondary | ICD-10-CM | POA: Diagnosis not present

## 2023-12-22 ENCOUNTER — Encounter: Payer: Self-pay | Admitting: Podiatry

## 2023-12-22 ENCOUNTER — Ambulatory Visit: Admitting: Podiatry

## 2023-12-22 DIAGNOSIS — Z9889 Other specified postprocedural states: Secondary | ICD-10-CM

## 2023-12-22 DIAGNOSIS — L600 Ingrowing nail: Secondary | ICD-10-CM

## 2023-12-23 NOTE — Progress Notes (Signed)
 He presents today for follow-up of his matrixectomy hallux right.  States that it still soaking and a little bit tender he continues to place a Band-Aid daily.  Objective: Vital signs are stable he is alert and oriented x 3 the hallux appears to be healing very nicely.  I see no signs of infection.  Assessment: Well-healing surgical toe hallux right.  Plan: Recommended that he discontinue soaking twice daily and go to once every day or every other day for the next week or so and then discontinue totally.  He can continue the drops until finished and cover during the day with a Band-Aid but leave open at bedtime.  He will follow-up with us  as needed

## 2023-12-24 ENCOUNTER — Ambulatory Visit: Admitting: Podiatry

## 2023-12-28 ENCOUNTER — Other Ambulatory Visit: Payer: Self-pay

## 2023-12-28 MED ORDER — APIXABAN 5 MG PO TABS: 5.0000 mg | ORAL_TABLET | Freq: Two times a day (BID) | ORAL | 3 refills | Status: AC

## 2023-12-28 NOTE — Telephone Encounter (Signed)
 Prescription refill request for Eliquis  received. Indication:afib Last office visit:7/25 Scr: 1.14  11/25 Age:78 Weight:101.2  kg  Prescription refilled
